# Patient Record
Sex: Female | Born: 1965 | Race: White | Hispanic: No | Marital: Married | Smoking: Current every day smoker
Health system: Southern US, Community
[De-identification: ages and names within clinical notes are randomized; demographics above are authoritative.]

## PROBLEM LIST (undated history)

## (undated) DIAGNOSIS — I639 Cerebral infarction, unspecified: Secondary | ICD-10-CM

## (undated) DIAGNOSIS — G709 Myoneural disorder, unspecified: Secondary | ICD-10-CM

## (undated) DIAGNOSIS — E785 Hyperlipidemia, unspecified: Secondary | ICD-10-CM

## (undated) DIAGNOSIS — I10 Essential (primary) hypertension: Secondary | ICD-10-CM

## (undated) DIAGNOSIS — E119 Type 2 diabetes mellitus without complications: Secondary | ICD-10-CM

## (undated) DIAGNOSIS — N189 Chronic kidney disease, unspecified: Secondary | ICD-10-CM

## (undated) DIAGNOSIS — N951 Menopausal and female climacteric states: Secondary | ICD-10-CM

## (undated) DIAGNOSIS — Z9289 Personal history of other medical treatment: Secondary | ICD-10-CM

## (undated) HISTORY — DX: Cerebral infarction, unspecified: I63.9

## (undated) HISTORY — DX: Chronic kidney disease, unspecified: N18.9

## (undated) HISTORY — DX: Type 2 diabetes mellitus without complications: E11.9

## (undated) HISTORY — DX: Menopausal and female climacteric states: N95.1

## (undated) HISTORY — DX: Hyperlipidemia, unspecified: E78.5

## (undated) HISTORY — PX: COLONOSCOPY: SHX174

## (undated) HISTORY — PX: KIDNEY TRANSPLANT: SHX239

---

## 1997-12-16 ENCOUNTER — Other Ambulatory Visit: Admission: RE | Admit: 1997-12-16 | Discharge: 1997-12-16 | Payer: Self-pay | Admitting: Gynecology

## 1998-12-15 ENCOUNTER — Other Ambulatory Visit: Admission: RE | Admit: 1998-12-15 | Discharge: 1998-12-15 | Payer: Self-pay | Admitting: *Deleted

## 1999-10-08 ENCOUNTER — Other Ambulatory Visit: Admission: RE | Admit: 1999-10-08 | Discharge: 1999-10-08 | Payer: Self-pay | Admitting: *Deleted

## 1999-12-21 ENCOUNTER — Ambulatory Visit (HOSPITAL_COMMUNITY): Admission: RE | Admit: 1999-12-21 | Discharge: 1999-12-21 | Payer: Self-pay | Admitting: *Deleted

## 1999-12-21 ENCOUNTER — Encounter: Payer: Self-pay | Admitting: *Deleted

## 2000-07-29 ENCOUNTER — Other Ambulatory Visit: Admission: RE | Admit: 2000-07-29 | Discharge: 2000-07-29 | Payer: Self-pay | Admitting: Gynecology

## 2001-01-09 ENCOUNTER — Other Ambulatory Visit: Admission: RE | Admit: 2001-01-09 | Discharge: 2001-01-09 | Payer: Self-pay | Admitting: Gynecology

## 2002-10-15 ENCOUNTER — Encounter: Admission: RE | Admit: 2002-10-15 | Discharge: 2003-01-13 | Payer: Self-pay | Admitting: Nephrology

## 2003-07-08 ENCOUNTER — Other Ambulatory Visit: Admission: RE | Admit: 2003-07-08 | Discharge: 2003-07-08 | Payer: Self-pay | Admitting: Family Medicine

## 2003-07-18 ENCOUNTER — Encounter: Admission: RE | Admit: 2003-07-18 | Discharge: 2003-07-18 | Payer: Self-pay | Admitting: Nephrology

## 2003-11-18 ENCOUNTER — Encounter (HOSPITAL_COMMUNITY): Admission: RE | Admit: 2003-11-18 | Discharge: 2003-11-29 | Payer: Self-pay | Admitting: Nephrology

## 2003-12-24 ENCOUNTER — Encounter: Admission: RE | Admit: 2003-12-24 | Discharge: 2004-03-23 | Payer: Self-pay | Admitting: Nephrology

## 2005-02-16 ENCOUNTER — Other Ambulatory Visit: Admission: RE | Admit: 2005-02-16 | Discharge: 2005-02-16 | Payer: Self-pay | Admitting: Family Medicine

## 2007-08-23 ENCOUNTER — Encounter: Admission: RE | Admit: 2007-08-23 | Discharge: 2007-08-23 | Payer: Self-pay | Admitting: Internal Medicine

## 2008-10-16 ENCOUNTER — Encounter: Admission: RE | Admit: 2008-10-16 | Discharge: 2008-10-16 | Payer: Self-pay | Admitting: Internal Medicine

## 2009-10-23 ENCOUNTER — Encounter: Admission: RE | Admit: 2009-10-23 | Discharge: 2009-10-23 | Payer: Self-pay | Admitting: Internal Medicine

## 2010-06-14 ENCOUNTER — Encounter: Payer: Self-pay | Admitting: Internal Medicine

## 2010-06-15 ENCOUNTER — Encounter: Payer: Self-pay | Admitting: Internal Medicine

## 2010-12-16 ENCOUNTER — Other Ambulatory Visit: Payer: Self-pay | Admitting: *Deleted

## 2010-12-16 DIAGNOSIS — Z1231 Encounter for screening mammogram for malignant neoplasm of breast: Secondary | ICD-10-CM

## 2010-12-28 ENCOUNTER — Ambulatory Visit
Admission: RE | Admit: 2010-12-28 | Discharge: 2010-12-28 | Disposition: A | Discharge status: Home / Self Care | Payer: BC Managed Care – PPO | Source: Ambulatory Visit | Attending: *Deleted | Admitting: *Deleted

## 2010-12-28 DIAGNOSIS — Z1231 Encounter for screening mammogram for malignant neoplasm of breast: Secondary | ICD-10-CM

## 2011-04-19 DIAGNOSIS — Z940 Kidney transplant status: Secondary | ICD-10-CM | POA: Insufficient documentation

## 2011-04-19 DIAGNOSIS — Z79899 Other long term (current) drug therapy: Secondary | ICD-10-CM | POA: Insufficient documentation

## 2011-06-07 ENCOUNTER — Ambulatory Visit (INDEPENDENT_AMBULATORY_CARE_PROVIDER_SITE_OTHER): Payer: BC Managed Care – PPO | Admitting: Family Medicine

## 2011-06-07 DIAGNOSIS — E789 Disorder of lipoprotein metabolism, unspecified: Secondary | ICD-10-CM

## 2011-06-07 DIAGNOSIS — B351 Tinea unguium: Secondary | ICD-10-CM

## 2011-06-07 DIAGNOSIS — J45909 Unspecified asthma, uncomplicated: Secondary | ICD-10-CM

## 2011-06-10 ENCOUNTER — Encounter: Payer: Self-pay | Admitting: Family Medicine

## 2011-06-10 DIAGNOSIS — E785 Hyperlipidemia, unspecified: Secondary | ICD-10-CM

## 2011-06-10 DIAGNOSIS — E872 Acidosis: Secondary | ICD-10-CM

## 2011-06-10 DIAGNOSIS — I10 Essential (primary) hypertension: Secondary | ICD-10-CM

## 2011-06-10 DIAGNOSIS — I151 Hypertension secondary to other renal disorders: Secondary | ICD-10-CM | POA: Insufficient documentation

## 2011-06-10 DIAGNOSIS — N189 Chronic kidney disease, unspecified: Secondary | ICD-10-CM | POA: Insufficient documentation

## 2011-07-12 ENCOUNTER — Other Ambulatory Visit: Payer: Self-pay | Admitting: Family Medicine

## 2011-07-14 NOTE — Telephone Encounter (Signed)
Pt would like for Dr Hal Hope nurse to contact her she is wanting to come in on Friday and have lab work done and has some questions

## 2011-07-19 ENCOUNTER — Telehealth: Payer: Self-pay

## 2011-07-19 NOTE — Telephone Encounter (Signed)
LMOM to CB with details. 

## 2011-07-19 NOTE — Telephone Encounter (Signed)
LMOM on asst's VM to please let us know what labs Dr Eliott Nine wants [place] to run and how often, and requested fax of latest OV notes

## 2011-07-19 NOTE — Telephone Encounter (Signed)
Spoke with patient who states [facility] would like [place] to get more involved in patients care.  ??We are to draw transplant labs here.  Patient needs LFT's q 2 weeks drawn as she is on Lamisil.  Blood sugars have been elevated, many between 160-200.  Lets schedule patient appointment next Monday with me.  Lets contact [facility] for their last office note and any labs they want drawn.  Patient aware some specialty labs can only be drawn at specialty drawing sites.

## 2011-07-19 NOTE — Telephone Encounter (Signed)
Msg for Dr. Hal Hope, pt states she left a msg last week and we did not respond. She needs to have specific labs done ASAP and wants to be sure Dr. Hal Hope has ordered these so she can come in for the lab draw.

## 2011-07-21 ENCOUNTER — Telehealth: Payer: Self-pay | Admitting: *Deleted

## 2011-07-21 NOTE — Telephone Encounter (Signed)
PT WANTED TO KNOW WHAT YOU WERE CALLING ABOUT AND WHAT LABWORK YOU WERE GOING TO DRAW.  SHOULD WE JUST GET HER TO COME IN.

## 2011-07-22 NOTE — Telephone Encounter (Signed)
Spoke with patient to follow up with me in clinic 07/26/11

## 2011-07-26 ENCOUNTER — Encounter: Payer: Self-pay | Admitting: Family Medicine

## 2011-07-26 ENCOUNTER — Ambulatory Visit (INDEPENDENT_AMBULATORY_CARE_PROVIDER_SITE_OTHER): Payer: BC Managed Care – PPO | Admitting: Family Medicine

## 2011-07-26 ENCOUNTER — Telehealth: Payer: Self-pay

## 2011-07-26 DIAGNOSIS — N189 Chronic kidney disease, unspecified: Secondary | ICD-10-CM

## 2011-07-26 DIAGNOSIS — Z940 Kidney transplant status: Secondary | ICD-10-CM

## 2011-07-26 DIAGNOSIS — E119 Type 2 diabetes mellitus without complications: Secondary | ICD-10-CM

## 2011-07-26 DIAGNOSIS — T8610 Unspecified complication of kidney transplant: Secondary | ICD-10-CM

## 2011-07-26 DIAGNOSIS — B351 Tinea unguium: Secondary | ICD-10-CM

## 2011-07-26 DIAGNOSIS — E785 Hyperlipidemia, unspecified: Secondary | ICD-10-CM

## 2011-07-26 DIAGNOSIS — I10 Essential (primary) hypertension: Secondary | ICD-10-CM

## 2011-07-26 DIAGNOSIS — E872 Acidosis: Secondary | ICD-10-CM

## 2011-07-26 LAB — COMPREHENSIVE METABOLIC PANEL
BUN: 35 mg/dL — ABNORMAL HIGH (ref 6–23)
CO2: 21 mEq/L (ref 19–32)
Calcium: 9.9 mg/dL (ref 8.4–10.5)
Chloride: 105 mEq/L (ref 96–112)
Creat: 1.46 mg/dL — ABNORMAL HIGH (ref 0.50–1.10)
Glucose, Bld: 158 mg/dL — ABNORMAL HIGH (ref 70–99)

## 2011-07-26 LAB — LIPID PANEL
Cholesterol: 184 mg/dL (ref 0–200)
HDL: 43 mg/dL (ref >39)
Total CHOL/HDL Ratio: 4.3 Ratio
Triglycerides: 347 mg/dL — ABNORMAL HIGH (ref <150)

## 2011-07-26 MED ORDER — INSULIN PEN NEEDLE 29G X 12.7MM MISC: Status: DC

## 2011-07-26 MED ORDER — INSULIN GLARGINE 100 UNIT/ML ~~LOC~~ SOLN: SUBCUTANEOUS | Status: DC

## 2011-07-26 NOTE — Telephone Encounter (Signed)
[facility] needs clarification on instructions.   one-  What kind of needles   two - they no longer have reusable needles - please specify.  [phone]

## 2011-07-26 NOTE — Telephone Encounter (Signed)
Gavin Pound called from [facility] requesting info on labs ordered at todays OV.  She asks that when the lab results come in, we fax results to [phone].  Note to Dr. Hal Hope.

## 2011-07-26 NOTE — Progress Notes (Signed)
  Subjective:    Patient ID: Isabella Obrien, female    DOB: [birth year], 46 y.o.   MRN: [id number]  HPI  Patient presents in follow up of OV with Dr. Eliott Nine at [facility]. Office blood work noted elevated blood sugars and patient referred to for diabetic care.  Patient started on Lamisil 2 1/2 weeks ago onychomycosis. Per her nephrologist she is to take 1/2 tablet daily and repeat liver enzymes q 2 weeks while she is on this medication.  Requests prograf level to be drawn and that we draw her transplant labs here.  Patient does not bring with her an order from her nephrologist.  Typically Dr. Eliott Nine and her prior nephrologist at [facility] would have her have these drawn at [facility].  Recent opthalmology exam noted early cataracts in both eyes secondary to prolong prednisone use.  Recent dermatology visit with skin lesion removed from forehead ?cancer.  Review of Systems     Objective:   Physical Exam  Constitutional: She appears well-developed and well-nourished.  HENT:  Mouth/Throat: Oropharynx is clear and moist.  Neck: Neck supple.  Cardiovascular: Normal rate, regular rhythm and normal heart sounds.   Pulmonary/Chest: Effort normal and breath sounds normal.  Neurological: She is alert.   Patient successfully taught to self inject 10 units of Lantus insulin via the solo star pen.        Assessment & Plan:   1. IDDM  Comprehensive metabolic panel, Lipid panel, POCT glycosylated hemoglobin (Hb A1C), TSH, insulin glargine (LANTUS SOLOSTAR) 100 UNIT/ML injection, Insulin Pen Needle 29G X 12.7MM MISC  2. Renal transplant disorder  Comprehensive metabolic panel  3. 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 CRF (chronic renal failure)  Patient to continue having her transplant labs under the care of nephrology  5. HTN (hypertension)    6. Dyslipidemia    7. Immunosuppression    8. Metabolic acidosis    9. Onychomycosis  Check liver enzymes q  2 weeks per nephrology   Follow up in two weeks to check blood sugars and recheck liver enzymes.

## 2011-07-26 NOTE — Telephone Encounter (Signed)
Called [facility] and [facility] notifying them that needles are for Solostar pen, and to disregard note to reuse needles, that is note to patient.  Verified with Chelle.  Chart to MD for prior msg.

## 2011-08-09 ENCOUNTER — Ambulatory Visit: Payer: BC Managed Care – PPO | Admitting: Family Medicine

## 2011-08-23 ENCOUNTER — Encounter: Payer: Self-pay | Admitting: Family Medicine

## 2011-08-23 ENCOUNTER — Ambulatory Visit (INDEPENDENT_AMBULATORY_CARE_PROVIDER_SITE_OTHER): Payer: BC Managed Care – PPO | Admitting: Family Medicine

## 2011-08-23 VITALS — BP 145/100 | HR 77 | Temp 97.4°F | Resp 18 | Ht 63.0 in | Wt 164.8 lb

## 2011-08-23 DIAGNOSIS — I10 Essential (primary) hypertension: Secondary | ICD-10-CM

## 2011-08-23 DIAGNOSIS — G44209 Tension-type headache, unspecified, not intractable: Secondary | ICD-10-CM

## 2011-08-23 DIAGNOSIS — Z5181 Encounter for therapeutic drug level monitoring: Secondary | ICD-10-CM

## 2011-08-23 DIAGNOSIS — N912 Amenorrhea, unspecified: Secondary | ICD-10-CM

## 2011-08-23 DIAGNOSIS — E785 Hyperlipidemia, unspecified: Secondary | ICD-10-CM

## 2011-08-23 DIAGNOSIS — N189 Chronic kidney disease, unspecified: Secondary | ICD-10-CM

## 2011-08-23 DIAGNOSIS — E119 Type 2 diabetes mellitus without complications: Secondary | ICD-10-CM

## 2011-08-23 DIAGNOSIS — B351 Tinea unguium: Secondary | ICD-10-CM

## 2011-08-23 DIAGNOSIS — E872 Acidosis: Secondary | ICD-10-CM

## 2011-08-23 DIAGNOSIS — Z940 Kidney transplant status: Secondary | ICD-10-CM

## 2011-08-23 LAB — COMPREHENSIVE METABOLIC PANEL
ALT: 16 U/L (ref 0–35)
BUN: 26 mg/dL — ABNORMAL HIGH (ref 6–23)
CO2: 24 mEq/L (ref 19–32)
Calcium: 10.2 mg/dL (ref 8.4–10.5)
Chloride: 106 mEq/L (ref 96–112)
Creat: 1.4 mg/dL — ABNORMAL HIGH (ref 0.50–1.10)

## 2011-08-23 LAB — GLUCOSE, POCT (MANUAL RESULT ENTRY): POC Glucose: 123

## 2011-08-23 MED ORDER — CYCLOBENZAPRINE HCL 10 MG PO TABS: 10.0000 mg | ORAL_TABLET | Freq: Every evening | ORAL | Status: AC | PRN

## 2011-08-23 NOTE — Progress Notes (Signed)
  Subjective:    Patient ID: Isabella Obrien, female    DOB: [birth year], 46 y.o.   MRN: [id number]  HPI Patient presents for routine follow up of her multiple medical problems  1) Renal transplant associated with chronic immunosuppression; progression renal decline; chronic             metabolic acidosis.     Was being followed at [facility] recently transferred care to Dr Eliott Nine; OV q 2 months to follow         GFR.  2) IDDM- fasting BS's improving; did not titrate up on insulin; did not take insulin this morning  3) HTN- renovascular in nature; BP has been running high recently; not regularly checking BP  4) Dyslipidemia- no side effects on Tricor and Lipitor  5) Family stressors- brother has alienated himself from family; now with tension headache  6) Onychomycosis- here for recheck of liver enzymes  7) Amenorrhea- last normal menses in February; recently developed hot flashes; per patient it is     possible she may be pregnant   Review of Systems     Objective:   Physical Exam  Constitutional: She appears well-developed.  HENT:  Mouth/Throat: Oropharynx is clear and moist.  Eyes: EOM are normal.  Neck: Neck supple. No JVD present. No thyromegaly present.  Cardiovascular: Normal rate, regular rhythm and normal heart sounds.   Pulmonary/Chest: Effort normal and breath sounds normal.  Abdominal: Soft. There is no hepatosplenomegaly.  Neurological: She is alert.  Skin: Skin is warm.          Assessment & Plan:   1. Amenorrhea  Follicle stimulating hormone, POCT urine pregnancy  2. Metabolic acidosis    3. CRF (chronic renal failure)    4. Dyslipidemia    5. HTN (hypertension)    6. Medication monitoring encounter  Comprehensive metabolic panel  7. Immunosuppression    8. DM type 2 (diabetes mellitus, type 2)    9. Onychomycosis    10. History of renal transplant    11. Tension headache  cyclobenzaprine (FLEXERIL) 10 MG tablet   Discuss very careful titration of  insulin given renal impairment Supportive counseling Increase Lotensin 20 mg 1 1/2 tablets daily.  Call in 2-3 weeks with BP readings. Patient in agreement with plan.

## 2011-08-24 ENCOUNTER — Telehealth: Payer: Self-pay | Admitting: Family Medicine

## 2011-08-26 NOTE — Telephone Encounter (Signed)
See documentation of 4/2 call under contact information

## 2011-09-20 ENCOUNTER — Telehealth: Payer: Self-pay

## 2011-09-20 NOTE — Telephone Encounter (Signed)
LM on home VM asking for call back.

## 2011-09-20 NOTE — Telephone Encounter (Signed)
Dr. Hal Hope- patient would like to speak with you regarding having labs.

## 2011-09-22 NOTE — Telephone Encounter (Signed)
LM asking for call back.

## 2011-09-24 ENCOUNTER — Telehealth: Payer: Self-pay

## 2011-09-24 NOTE — Telephone Encounter (Signed)
.  [facility] The patient called to say that she had labs done at her Nephrologist's office and they will be sent to [facility] from that doctor's office. This Clinical research associate believes this was just an [place] call.  Patient did not request return call from [facility].

## 2011-09-24 NOTE — Telephone Encounter (Signed)
Pt reports that she just had some labs done including LFTs at her kidney doctor's yesterday and they are supposed to send Dr Hal Hope the results. Her BP was around 140/90 yesterday, but has not been taking them at home as often as she should. Advised pt to take them 1-2 x day for about a week at least and call us w/results for Dr Hal Hope. Pt agreed. Pt wasn't sure if Dr Hal Hope wanted any tests run for her BS or not, but thinks the MD might have checked her A1c yesterday. Pt states she had been up to 12 units of insulin QD, but she was getting some fasting readings in 80s and 90s, and she was afraid they were getting too low. She dropped back to 10 units last week and they are not quite as low fasting and are getting up to as high as 150s sometimes later in the day. Pt asked if she should skip her insulin if her BS is below a certain # fasting? What f/up do you need pt to do at this point, or do you want to wait and see what labs look like? (Pt would like to not come in more than necessary d/t financial issues)

## 2011-09-24 NOTE — Telephone Encounter (Signed)
I have been unsuccessful trying to reach patient. She is a renal transplant patient with complex medical issues. Let try to reach her and the specifics to what she is needing.

## 2011-09-27 NOTE — Telephone Encounter (Signed)
Left message on VM for patient not to take insulin if BS's are less than 150 given her renal failure status.  Asked patient to call back with BP and fasting BP readings.  Have not yet received notes and labs from [facility]

## 2012-05-24 HISTORY — PX: ORIF ANKLE FRACTURE: SUR919

## 2012-11-05 ENCOUNTER — Ambulatory Visit: Payer: BC Managed Care – PPO

## 2012-11-05 ENCOUNTER — Ambulatory Visit (INDEPENDENT_AMBULATORY_CARE_PROVIDER_SITE_OTHER): Payer: BC Managed Care – PPO | Admitting: Emergency Medicine

## 2012-11-05 VITALS — BP 171/109 | HR 78 | Temp 98.1°F | Resp 20 | Ht 63.0 in | Wt 163.0 lb

## 2012-11-05 DIAGNOSIS — S82409A Unspecified fracture of shaft of unspecified fibula, initial encounter for closed fracture: Secondary | ICD-10-CM

## 2012-11-05 DIAGNOSIS — M25572 Pain in left ankle and joints of left foot: Secondary | ICD-10-CM

## 2012-11-05 DIAGNOSIS — M79609 Pain in unspecified limb: Secondary | ICD-10-CM

## 2012-11-05 DIAGNOSIS — S82402A Unspecified fracture of shaft of left fibula, initial encounter for closed fracture: Secondary | ICD-10-CM

## 2012-11-05 MED ORDER — HYDROCODONE-ACETAMINOPHEN 5-325 MG PO TABS: 1.0000 | ORAL_TABLET | ORAL | Status: DC | PRN

## 2012-11-05 NOTE — Patient Instructions (Addendum)
Ankle Fracture  A fracture is a break in the bone. A cast or splint is used to protect and keep your injured bone from moving.   HOME CARE INSTRUCTIONS    Use your crutches as directed.   To lessen the swelling, keep the injured leg elevated while sitting or lying down.   Apply ice to the injury for 15-20 minutes, 3-4 times per day while awake for 2 days. Put the ice in a plastic bag and place a thin towel between the bag of ice and your cast.   If you have a plaster or fiberglass cast:   Do not try to scratch the skin under the cast using sharp or pointed objects.   Check the skin around the cast every day. You may put lotion on any red or sore areas.   Keep your cast dry and clean.   If you have a plaster splint:   Wear the splint as directed.   You may loosen the elastic around the splint if your toes become numb, tingle, or turn cold or blue.   Do not put pressure on any part of your cast or splint; it may break. Rest your cast only on a pillow the first 24 hours until it is fully hardened.   Your cast or splint can be protected during bathing with a plastic bag. Do not lower the cast or splint into water.   Take medications as directed by your caregiver. Only take over-the-counter or prescription medicines for pain, discomfort, or fever as directed by your caregiver.   Do not drive a vehicle until your caregiver specifically tells you it is safe to do so.   If your caregiver has given you a follow-up appointment, it is very important to keep that appointment. Not keeping the appointment could result in a chronic or permanent injury, pain, and disability. If there is any problem keeping the appointment, you must call back to this facility for assistance.  SEEK IMMEDIATE MEDICAL CARE IF:    Your cast gets damaged or breaks.   You have continued severe pain or more swelling than you did before the cast was put on.   Your skin or toenails below the injury turn blue or gray, or feel cold or  numb.   There is a bad smell or new stains and/or purulent (pus like) drainage coming from under the cast.  If you do not have a window in your cast for observing the wound, a discharge or minor bleeding may show up as a stain on the outside of your cast. Report these findings to your caregiver.  MAKE SURE YOU:    Understand these instructions.   Will watch your condition.   Will get help right away if you are not doing well or get worse.  Document Released: 05/07/2000 Document Revised: 08/02/2011 Document Reviewed: 12/12/2007  ExitCare Patient Information 2014 [employer].

## 2012-11-05 NOTE — Progress Notes (Signed)
[facility] [address], [place] [address] [phone]  Date:  11/05/2012   Name:  Isabella Obrien   DOB:  [birth year]   MRN:  [id number]  PCP:  No primary provider on file.    Chief Complaint: left ankle injury   History of Present Illness:  Isabella Obrien is a 47 y.o. very pleasant female patient who presents with the following:  In[place] at the park when she stood up and rolled her ankle.  Unable to bear weight and has marked pain and swelling.  No improvement with over the counter medications or other home remedies. Denies other complaint or health concern today.   Patient Active Problem List   Diagnosis Date Noted  . DM type 2 (diabetes mellitus, type 2) 07/26/2011  . History of renal transplant 07/26/2011  . CRF (chronic renal failure) 06/10/2011  . HTN (hypertension) 06/10/2011  . Dyslipidemia 06/10/2011  . Immunosuppression 06/10/2011  . Metabolic acidosis 06/10/2011    No past medical history on file.  No past surgical history on file.  History  Substance Use Topics  . Smoking status: Current Every Day Smoker  . Smokeless tobacco: Not on file  . Alcohol Use: Yes    No family history on file.  Allergies  Allergen Reactions  . Benadryl (Diphenhydramine Hcl)   . Ceftin     Medication list has been reviewed and updated.  Current Outpatient Prescriptions on File Prior to Visit  Medication Sig Dispense Refill  . aspirin 81 MG tablet Take 160 mg by mouth daily.      Marland Kitchen atenolol (TENORMIN) 50 MG tablet Take 50 mg by mouth daily.      Marland Kitchen atorvastatin (LIPITOR) 40 MG tablet Take 40 mg by mouth daily.      . benazepril (LOTENSIN) 20 MG tablet Take 20 mg by mouth daily.      . calcium & magnesium carbonates (MYLANTA) 311-232 MG per tablet Take 1 tablet by mouth daily.      . furosemide (LASIX) 20 MG tablet Take 20 mg by mouth daily.      . magnesium 30 MG tablet Take 30 mg by mouth 2 (two) times daily.      . mycophenolate  (CELLCEPT) 500 MG tablet Take by mouth 2 (two) times daily.      . predniSONE (STERAPRED UNI-PAK) 5 MG TABS Take by mouth.      . sodium bicarbonate 325 MG tablet Take 325 mg by mouth 4 (four) times daily.      . tacrolimus (PROGRAF) 0.5 MG capsule Take 0.5 mg by mouth 2 (two) times daily.      Marland Kitchen zinc gluconate 50 MG tablet Take 50 mg by mouth daily.      . fenofibrate (TRICOR) 145 MG tablet Take 145 mg by mouth daily.      . insulin glargine (LANTUS SOLOSTAR) 100 UNIT/ML injection Start with 8 units daily then every 5 days increase by 2 units until fasting blood sugars less than or equal to 150.  10 mL  12  . Insulin Pen Needle 29G X 12.7MM MISC May reuse needle x 2  1 each  3   No current facility-administered medications on file prior to visit.    Review of Systems:  As per HPI, otherwise negative.    Physical Examination: Filed Vitals:   11/05/12 1101  BP: 171/109  Pulse: 78  Temp: 98.1 F (36.7 C)  Resp: 20   Filed  Vitals:   11/05/12 1101  Height: 5\' 3"  (1.6 m)  Weight: 163 lb (73.936 kg)   Body mass index is 28.88 kg/(m^2). Ideal Body Weight: Weight in (lb) to have BMI = 25: 140.8   GEN: WDWN, NAD, Non-toxic, Alert & Oriented x 3 HEENT: Atraumatic, Normocephalic.  Ears and Nose: No external deformity. EXTR: No clubbing/cyanosis/edema NEURO: Normal gait.  PSYCH: Normally interactive. Conversant. Not depressed or anxious appearing.  Calm demeanor.  LEFT ankle and foot ecchymotic and swollen.  Marked tenderness.  Assessment and Plan: Fracture distal fibula  Signed,  Phillips Odor, MD   [facility] reading (PRIMARY) by  Dr. Dareen Piano.  Fracture ankle.

## 2012-12-14 ENCOUNTER — Ambulatory Visit: Payer: BC Managed Care – PPO | Admitting: Endocrinology

## 2013-07-18 DIAGNOSIS — R809 Proteinuria, unspecified: Secondary | ICD-10-CM | POA: Insufficient documentation

## 2013-07-18 DIAGNOSIS — N189 Chronic kidney disease, unspecified: Secondary | ICD-10-CM | POA: Insufficient documentation

## 2013-07-18 DIAGNOSIS — N184 Chronic kidney disease, stage 4 (severe): Secondary | ICD-10-CM | POA: Insufficient documentation

## 2013-07-18 DIAGNOSIS — N051 Unspecified nephritic syndrome with focal and segmental glomerular lesions: Secondary | ICD-10-CM | POA: Insufficient documentation

## 2013-07-18 DIAGNOSIS — E1169 Type 2 diabetes mellitus with other specified complication: Secondary | ICD-10-CM | POA: Insufficient documentation

## 2013-07-18 DIAGNOSIS — M5431 Sciatica, right side: Secondary | ICD-10-CM | POA: Insufficient documentation

## 2013-07-18 DIAGNOSIS — E782 Mixed hyperlipidemia: Secondary | ICD-10-CM

## 2013-07-30 DIAGNOSIS — E139 Other specified diabetes mellitus without complications: Secondary | ICD-10-CM | POA: Insufficient documentation

## 2013-09-21 HISTORY — PX: OTHER SURGICAL HISTORY: SHX169

## 2013-12-14 ENCOUNTER — Other Ambulatory Visit: Payer: Self-pay | Admitting: Orthopedic Surgery

## 2013-12-17 ENCOUNTER — Encounter (HOSPITAL_COMMUNITY): Payer: Self-pay | Admitting: Pharmacy Technician

## 2013-12-20 ENCOUNTER — Encounter (HOSPITAL_COMMUNITY)
Admission: RE | Admit: 2013-12-20 | Discharge: 2013-12-20 | Disposition: A | Discharge status: Home / Self Care | Payer: BC Managed Care – PPO | Source: Ambulatory Visit | Attending: Orthopedic Surgery | Admitting: Orthopedic Surgery

## 2013-12-20 ENCOUNTER — Encounter (HOSPITAL_COMMUNITY): Payer: Self-pay

## 2013-12-20 DIAGNOSIS — E119 Type 2 diabetes mellitus without complications: Secondary | ICD-10-CM | POA: Diagnosis not present

## 2013-12-20 DIAGNOSIS — Z01810 Encounter for preprocedural cardiovascular examination: Secondary | ICD-10-CM | POA: Diagnosis not present

## 2013-12-20 DIAGNOSIS — Z01812 Encounter for preprocedural laboratory examination: Secondary | ICD-10-CM | POA: Diagnosis not present

## 2013-12-20 DIAGNOSIS — I10 Essential (primary) hypertension: Secondary | ICD-10-CM | POA: Insufficient documentation

## 2013-12-20 DIAGNOSIS — Z01818 Encounter for other preprocedural examination: Secondary | ICD-10-CM | POA: Diagnosis present

## 2013-12-20 DIAGNOSIS — F17200 Nicotine dependence, unspecified, uncomplicated: Secondary | ICD-10-CM | POA: Diagnosis not present

## 2013-12-20 HISTORY — DX: Myoneural disorder, unspecified: G70.9

## 2013-12-20 HISTORY — DX: Essential (primary) hypertension: I10

## 2013-12-20 HISTORY — DX: Personal history of other medical treatment: Z92.89

## 2013-12-20 LAB — CBC WITH DIFFERENTIAL/PLATELET
Basophils Absolute: 0 10*3/uL (ref 0.0–0.1)
Basophils Relative: 0 % (ref 0–1)
Eosinophils Absolute: 0.1 10*3/uL (ref 0.0–0.7)
Eosinophils Relative: 1 % (ref 0–5)
HEMATOCRIT: 32.2 % — AB (ref 36.0–46.0)
HEMOGLOBIN: 10.3 g/dL — AB (ref 12.0–15.0)
LYMPHS ABS: 1 10*3/uL (ref 0.7–4.0)
LYMPHS PCT: 10 % — AB (ref 12–46)
MCH: 28.1 pg (ref 26.0–34.0)
MCHC: 32 g/dL (ref 30.0–36.0)
MCV: 88 fL (ref 78.0–100.0)
MONO ABS: 0.5 10*3/uL (ref 0.1–1.0)
MONOS PCT: 5 % (ref 3–12)
NEUTROS ABS: 8.7 10*3/uL — AB (ref 1.7–7.7)
Neutrophils Relative %: 84 % — ABNORMAL HIGH (ref 43–77)
Platelets: 284 10*3/uL (ref 150–400)
RBC: 3.66 MIL/uL — AB (ref 3.87–5.11)
RDW: 13.9 % (ref 11.5–15.5)
WBC: 10.4 10*3/uL (ref 4.0–10.5)

## 2013-12-20 LAB — COMPREHENSIVE METABOLIC PANEL
ALT: 12 U/L (ref 0–35)
AST: 12 U/L (ref 0–37)
Albumin: 4.7 g/dL (ref 3.5–5.2)
Alkaline Phosphatase: 64 U/L (ref 39–117)
Anion gap: 16 — ABNORMAL HIGH (ref 5–15)
BILIRUBIN TOTAL: 0.2 mg/dL — AB (ref 0.3–1.2)
BUN: 25 mg/dL — ABNORMAL HIGH (ref 6–23)
CHLORIDE: 103 meq/L (ref 96–112)
CO2: 22 meq/L (ref 19–32)
CREATININE: 1.22 mg/dL — AB (ref 0.50–1.10)
Calcium: 9.7 mg/dL (ref 8.4–10.5)
GFR calc Af Amer: 61 mL/min — ABNORMAL LOW (ref >90)
GFR, EST NON AFRICAN AMERICAN: 52 mL/min — AB (ref >90)
Glucose, Bld: 142 mg/dL — ABNORMAL HIGH (ref 70–99)
Potassium: 4.7 mEq/L (ref 3.7–5.3)
Sodium: 141 mEq/L (ref 137–147)
Total Protein: 6.8 g/dL (ref 6.0–8.3)

## 2013-12-20 LAB — URINALYSIS, ROUTINE W REFLEX MICROSCOPIC
Bilirubin Urine: NEGATIVE
GLUCOSE, UA: NEGATIVE mg/dL
HGB URINE DIPSTICK: NEGATIVE
KETONES UR: NEGATIVE mg/dL
Leukocytes, UA: NEGATIVE
Nitrite: NEGATIVE
PROTEIN: 100 mg/dL — AB
Specific Gravity, Urine: 1.017 (ref 1.005–1.030)
UROBILINOGEN UA: 0.2 mg/dL (ref 0.0–1.0)
pH: 6.5 (ref 5.0–8.0)

## 2013-12-20 LAB — SURGICAL PCR SCREEN
MRSA, PCR: NEGATIVE
Staphylococcus aureus: NEGATIVE

## 2013-12-20 LAB — PROTIME-INR
INR: 0.9 (ref 0.00–1.49)
Prothrombin Time: 12.2 seconds (ref 11.6–15.2)

## 2013-12-20 LAB — APTT: APTT: 23 s — AB (ref 24–37)

## 2013-12-20 LAB — TYPE AND SCREEN
ABO/RH(D): A POS
Antibody Screen: NEGATIVE

## 2013-12-20 LAB — URINE MICROSCOPIC-ADD ON

## 2013-12-20 LAB — ABO/RH: ABO/RH(D): A POS

## 2013-12-20 NOTE — Progress Notes (Signed)
Primary - dr. Mardelle MatteAndy No cardiologist Dr. Eliott Nineunham Robbie Lis- [facility] No recent cardiac testing

## 2013-12-20 NOTE — Pre-Procedure Instructions (Signed)
Orvis BrillLorraine H Agent  12/20/2013   Your procedure is scheduled on:  Wednesday, August 5th  [facility] Admitting at 1100 AM.  Call this number if you have problems the morning of surgery: [phone]   Remember:   Do not eat food or drink liquids after midnight.   Take these medicines the morning of surgery with A SIP OF WATER: norvasc, atenolol, hydrocodone if needed, prednisone, cellcept, prograf, inhaler if needed  No diabetes medication or insulin on morning of surgery.  Stop taking aspirin, OTC vitamins/herbal medications, NSAIDS (ibuprofen, advil, motrin) 7 days prior to surgery.   Do not wear jewelry, make-up or nail polish.  Do not wear lotions, powders, or perfumes. You may wear deodorant.  Do not shave 48 hours prior to surgery. Men may shave face and neck.  Do not bring valuables to the hospital.  [facility] is not responsible for any belongings or valuables.               Contacts, dentures or bridgework may not be worn into surgery.  Leave suitcase in the car. After surgery it may be brought to your room.  For patients admitted to the hospital, discharge time is determined by your treatment team.               Patients discharged the day of surgery will not be allowed to drive home.  Please read over the following fact sheets that you were given: Pain Booklet, Coughing and Deep Breathing, Blood Transfusion Information, MRSA Information and Surgical Site Infection Prevention [place] - Preparing for Surgery  Before surgery, you can play an important role.  Because skin is not sterile, your skin needs to be as free of germs as possible.  You can reduce the number of germs on you skin by washing with CHG (chlorahexidine gluconate) soap before surgery.  CHG is an antiseptic cleaner which kills germs and bonds with the skin to continue killing germs even after washing.  Please DO NOT use if you have an allergy to CHG or antibacterial soaps.  If your skin  becomes reddened/irritated stop using the CHG and inform your nurse when you arrive at Short Stay.  Do not shave (including legs and underarms) for at least 48 hours prior to the first CHG shower.  You may shave your face.  Please follow these instructions carefully:   1.  Shower with CHG Soap the night before surgery and the morning of Surgery.  2.  If you choose to wash your hair, wash your hair first as usual with your normal shampoo.  3.  After you shampoo, rinse your hair and body thoroughly to remove the shampoo.  4.  Use CHG as you would any other liquid soap.  You can apply CHG directly to the skin and wash gently with scrungie or a clean washcloth.  5.  Apply the CHG Soap to your body ONLY FROM THE NECK DOWN.  Do not use on open wounds or open sores.  Avoid contact with your eyes, ears, mouth and genitals (private parts).  Wash genitals (private parts) with your normal soap.  6.  Wash thoroughly, paying special attention to the area where your surgery will be performed.  7.  Thoroughly rinse your body with warm water from the neck down.  8.  DO NOT shower/wash with your normal soap after using and rinsing off the CHG Soap.  9.  Pat yourself dry with a clean towel.  10.  Wear clean pajamas.            11.  Place clean sheets on your bed the night of your first shower and do not sleep with pets.  Day of Surgery  Do not apply any lotions/deoderants the morning of surgery.  Please wear clean clothes to the hospital/surgery center.

## 2013-12-21 ENCOUNTER — Encounter (HOSPITAL_COMMUNITY): Payer: Self-pay | Admitting: Vascular Surgery

## 2013-12-21 NOTE — Progress Notes (Addendum)
Anesthesia Chart Review:  Patient is a 48 year old female scheduled for L5-S1 decompression on 12/26/13 by Dr. Yevette Edwardsumonski.  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 PCP is Dr. Asencion Partridgeamille Andy.  Nephrologist is Dr. Camille Balynthia Dunham.  EKG on 12/20/13 showed NSR, T wave abnormality, consider anterolateral ischemia. Currently, there are no comparison EKGs available.  CXR on 12/20/13 showed: No acute cardiopulmonary disease.  Preoperative labs noted. BUN/Cr 25/1.22, glucose 142.  H/H 10.3/32.2.  Records requested from [facility] and [facility]--including any prior cardiac records.  I'll review once available.  Velna Ochsllison Angelis Gates, PA-C [facility] Short Stay Center/Anesthesiology Phone [phone] 12/21/2013 11:14 AM  Addendum: 12/25/2013 12:54 PM Records requested yesterday. I called again today records were just sent.  [facility] did not sent the EKG tracing as requested, but sent the EKG interpretation reports from 09/28/04 showing SR with non-specific T wave abnormality.  Stress echo on 08/28/03 showed normal LV chamber size, wall thickness and contractility with no segmental wall motion abnormalities, no evidence of inducible ischemia.    Her last transplant clinic note was from 12/13/13 with Dr. Knox SalivaAmber Reeves-Daniel.  Notes indicated she received a preemptive living related renal transplant to the right pelvis from her brother on 09/29/04. Post transplantation course was complicated by early FSGS recurrent require plasmapheresis. Nadircreatinine 1.5 with previous baseline creatinine 1.6-2.0. In March of 2015 she had increase in proteinuria (3.1 g), and it was recommended that she undergo a transplant biopsy which was performed 08/23/13 and showed FSGS, collapsing variant and nodular glomerulosclerosis, consistent with DM, as well as vascular changes. She started pheresis 09/24/2013 with improvement in UPC to around 1 g. She was receiving weekly  pheresis, now every two weeks until the end of August with hopes to do one session in the end of September and then discontinue.    Based on available information, EKG changes appear new--current changes do not appear non-specific as mentioned in her comparison EKG report.  Last cardiology testing received was 10 years ago. (She denied any recent cardiac testing at her PAT visit.) She has multiple CAD risk factors.  I reviewed above with anesthesiologist Dr. Noreene LarssonJoslin who agreed with need for preoperative cardiology evaluation.  Message left with Albin Fellingarla at Dr. Marshell Levanumonski's office.

## 2013-12-25 MED ORDER — VANCOMYCIN HCL IN DEXTROSE 1-5 GM/200ML-% IV SOLN: 1000.0000 mg | INTRAVENOUS | Status: DC

## 2013-12-25 MED ORDER — POVIDONE-IODINE 7.5 % EX SOLN
Freq: Once | CUTANEOUS | Status: DC
  Filled 2013-12-25: qty 118

## 2013-12-26 ENCOUNTER — Ambulatory Visit (HOSPITAL_COMMUNITY)
Admission: RE | Admit: 2013-12-26 | Payer: BC Managed Care – PPO | Source: Ambulatory Visit | Admitting: Orthopedic Surgery

## 2013-12-26 ENCOUNTER — Encounter (HOSPITAL_COMMUNITY): Admission: RE | Payer: Self-pay | Source: Ambulatory Visit

## 2013-12-26 SURGERY — LUMBAR LAMINECTOMY/DECOMPRESSION MICRODISCECTOMY
Anesthesia: General

## 2013-12-31 ENCOUNTER — Telehealth: Payer: Self-pay | Admitting: Internal Medicine

## 2013-12-31 ENCOUNTER — Ambulatory Visit (INDEPENDENT_AMBULATORY_CARE_PROVIDER_SITE_OTHER): Payer: BC Managed Care – PPO | Admitting: Internal Medicine

## 2013-12-31 VITALS — BP 132/78 | HR 71 | Ht 63.0 in | Wt 156.0 lb

## 2013-12-31 DIAGNOSIS — Z01810 Encounter for preprocedural cardiovascular examination: Secondary | ICD-10-CM

## 2013-12-31 NOTE — Progress Notes (Signed)
HPI Patient is a 48 yo who is referred for cardiac risk stratification  Being evaluated for back surgery Patient has no known CAD Hix of FSGS (s/p renal tx), DM2, HTN and tob use   EKG in July showed SR Twave inversion V1-V6   Patient had a str[facility] prior to transplant  May have had an EKG since at [facility]. She denies CP  She is very active  Does housework  Can walk flights of stairs without problem  Walks outside.  She is fatigued but attib to stress    Patient has been on lipitor for years  Metformin for a couple years   Allergies  Allergen Reactions  . Benadryl [Diphenhydramine Hcl] Other (See Comments)    REACTION: anxiety, hyperactivity  . Ceftin Swelling  . Tape Rash    Paper tape ok.    Current Outpatient Prescriptions  Medication Sig Dispense Refill  . albuterol (PROVENTIL HFA;VENTOLIN HFA) 108 (90 BASE) MCG/ACT inhaler Inhale into the lungs every 6 (six) hours as needed for wheezing or shortness of breath.      Marland Kitchen. amLODipine (NORVASC) 5 MG tablet Take 5 mg by mouth daily.      Marland Kitchen. aspirin EC 81 MG tablet Take 81 mg by mouth daily.      Marland Kitchen. atenolol (TENORMIN) 100 MG tablet Take 100 mg by mouth daily.      Marland Kitchen. atorvastatin (LIPITOR) 40 MG tablet Take 40 mg by mouth daily.      . benazepril (LOTENSIN) 40 MG tablet Take 40 mg by mouth 2 (two) times daily.      . calcium carbonate (OS-CAL) 600 MG TABS tablet Take 1,200 mg by mouth daily.      . cholecalciferol (VITAMIN D) 1000 UNITS tablet Take 1,000 Units by mouth daily.      . furosemide (LASIX) 20 MG tablet Take 20 mg by mouth every Monday, Wednesday, and Friday.      Marland Kitchen. HYDROcodone-acetaminophen (NORCO) 10-325 MG per tablet Take 1-2 tablets by mouth every 4 (four) hours as needed for moderate pain.      . Magnesium 250 MG TABS Take 250 mg by mouth 2 (two) times daily.      . metFORMIN (GLUCOPHAGE) 1000 MG tablet Take 1,000 mg by mouth 2 (two) times daily with a meal.      . mycophenolate (CELLCEPT) 500 MG  tablet Take by mouth 2 (two) times daily.      . predniSONE (DELTASONE) 5 MG tablet Take 5 mg by mouth daily with breakfast.      . sodium bicarbonate 325 MG tablet Take 650 mg by mouth 3 (three) times daily.       . tacrolimus (PROGRAF) 1 MG capsule Take 1 mg by mouth 2 (two) times daily.      Marland Kitchen. zinc gluconate 50 MG tablet Take 50 mg by mouth daily.       No current facility-administered medications for this visit.    Past Medical History  Diagnosis Date  . Hypertension   . Bronchitis, allergic   . Pneumonia     hx of  . Diabetes mellitus without complication     dm type 2 - fasting 150  . Neuromuscular disorder   . History of blood product transfusion   . Chronic kidney disease     has kidney transplant - having aphoresis now every other week    Past Surgical History  Procedure Laterality Date  . Kidney transplant    .  Colonoscopy    . Diateck catheter placement  09/2013  . Ankle fracture Left     pins and rod in place    No family history on file.  History   Social History  . Marital Status: Married    Spouse Name: N/A    Number of Children: N/A  . Years of Education: N/A   Occupational History  . Not on file.   Social History Main Topics  . Smoking status: Current Every Day Smoker -- 0.50 packs/day for 30 years    Types: Cigarettes  . Smokeless tobacco: Not on file  . Alcohol Use: Yes     Comment: weekends  . Drug Use: No  . Sexual Activity: Not on file   Other Topics Concern  . Not on file   Social History Narrative  . No narrative on file    Review of Systems:  All systems reviewed.  They are negative to the above problem except as previously stated.  Vital Signs: BP 132/78  Pulse 71  Ht 5\' 3"  (1.6 m)  Wt 156 lb (70.761 kg)  BMI 27.64 kg/m2  LMP 10/24/2012  Physical Exam [place] is in NAD HEENT:  Normocephalic, atraumatic. EOMI, PERRLA.  Neck: JVP is normal.  No bruits.  Lungs: clear to auscultation. No rales no wheezes.  Heart: Regular  rate and rhythm. Normal S1, S2. No S3.   No significant murmurs. PMI not displaced.  Abdomen:  Supple, nontender. Normal bowel sounds. No masses. No hepatomegaly.  Extremities:   Good distal pulses throughout. No lower extremity edema.  Musculoskeletal :moving all extremities.  Neuro:   alert and oriented x3.  CN II-XII grossly intact.  EKG  SR 79  T wave inversion V1-V6  Biphasic T waves in I, AVL Assessment and Plan:  Patient is a 48 yo who is being evaluated for back surgery  Active  Asymptomatic   EKG shows SR with T wave inversion.  I will try to get old EKGs as well as studies done at [facility]. She is very active without symptoms  Exam unremarkable   EKG alone does not prohibit surgery  Ovreall I think she is at low risk for cardiac event and is  OK to proceed without further testing  Prior.   Encouraged her to stay active.

## 2013-12-31 NOTE — Telephone Encounter (Signed)
ROI faxed to [facility] @ [phone]   8.10.15/km

## 2014-01-24 ENCOUNTER — Other Ambulatory Visit: Payer: Self-pay | Admitting: Orthopedic Surgery

## 2014-02-21 ENCOUNTER — Inpatient Hospital Stay (HOSPITAL_COMMUNITY)
Admission: RE | Admit: 2014-02-21 | Discharge: 2014-02-21 | Disposition: A | Discharge status: Home / Self Care | Payer: BC Managed Care – PPO | Source: Ambulatory Visit

## 2014-02-21 NOTE — Progress Notes (Signed)
Nurse called and left a voicemail inquring about 0900 scheduled PAT appointment. Direct call back number left.

## 2014-02-27 ENCOUNTER — Encounter (HOSPITAL_COMMUNITY): Admission: RE | Payer: Self-pay | Source: Ambulatory Visit

## 2014-02-27 ENCOUNTER — Ambulatory Visit (HOSPITAL_COMMUNITY)
Admission: RE | Admit: 2014-02-27 | Payer: BC Managed Care – PPO | Source: Ambulatory Visit | Admitting: Orthopedic Surgery

## 2014-02-27 SURGERY — LUMBAR LAMINECTOMY/DECOMPRESSION MICRODISCECTOMY
Anesthesia: General

## 2014-11-18 ENCOUNTER — Other Ambulatory Visit: Payer: Self-pay

## 2017-04-11 ENCOUNTER — Telehealth: Payer: Self-pay | Admitting: Physician Assistant

## 2017-04-11 ENCOUNTER — Ambulatory Visit: Payer: BC Managed Care – PPO | Admitting: Physician Assistant

## 2017-04-11 ENCOUNTER — Other Ambulatory Visit: Payer: Self-pay

## 2017-04-11 VITALS — BP 108/66 | HR 71 | Temp 98.1°F | Resp 16 | Ht 63.0 in | Wt 162.0 lb

## 2017-04-11 DIAGNOSIS — S76011A Strain of muscle, fascia and tendon of right hip, initial encounter: Secondary | ICD-10-CM | POA: Diagnosis not present

## 2017-04-11 DIAGNOSIS — M62838 Other muscle spasm: Secondary | ICD-10-CM

## 2017-04-11 DIAGNOSIS — S76311A Strain of muscle, fascia and tendon of the posterior muscle group at thigh level, right thigh, initial encounter: Secondary | ICD-10-CM

## 2017-04-11 MED ORDER — HYDROCODONE-ACETAMINOPHEN 5-325 MG PO TABS: 1.0000 | ORAL_TABLET | Freq: Four times a day (QID) | ORAL | 0 refills | Status: DC | PRN

## 2017-04-11 NOTE — Telephone Encounter (Signed)
Pt requesting Flexeril

## 2017-04-11 NOTE — Patient Instructions (Addendum)
Do not operate heavy machinery with taking the flexeril.  I would like you to ice this area three times per day for 15 minutes.   After 48 hours, I would like you to perform small easy stretches below.  Pick three pictures.   You can also just take tylenol for pain, if this hamstring pain is not too severe.    Hamstring Strain A hamstring strain is an injury that occurs when the hamstring muscles are overstretched or overloaded. The hamstring muscles are a group of muscles at the back of the thighs. These muscles are used in straightening the hips, bending the knees, and pulling back the legs. This type of injury is often called a pulled hamstring muscle. The severity of a muscle strain is rated in degrees. First-degree strains have the least amount of muscle fiber tearing and pain. Second-degree and third-degree strains have increasingly more tearing and pain. What are the causes? Hamstring strains occur when a sudden, violent force is placed on these muscles and stretches them too far. This often occurs during activities that involve running, jumping, kicking, or weight lifting. What increases the risk? Hamstring strains are especially common in athletes. Other things that can increase your risk for this injury include:  Having low strength, endurance, or flexibility of the hamstring muscles.  Performing high-impact physical activity.  Having poor physical fitness.  Having a previous leg injury.  Having fatigued muscles.  Older age.  What are the signs or symptoms?  Pain in the back of the thigh.  Bruising.  Swelling.  Muscle spasm.  Difficulty using the muscle because of pain or lack of normal function. For severe strains, you may have a popping or snapping feeling when the injury occurs. How is this diagnosed? Your health care provider will perform a physical exam and ask about your medical history. How is this treated? Often, the best treatment for a hamstring strain is  protecting, resting, icing, applying compression, and elevating the injured area. This is referred to as the PRICE method of treatment. Your health care provider may also recommend medicines to help reduce pain or inflammation. Follow these instructions at home:  Use the PRICE method of treatment to promote muscle healing during the first 2-3 days after your injury. The PRICE method involves: ? P-Protecting the muscle from being injured again. ? R-Restricting your activity and resting the injured body part. ? I-Icing your injury. To do this, put ice in a plastic bag. Place a towel between your skin and the bag. Then, apply the ice and leave it on for 20 minutes, 2-3 times per day. After the third day, switch to moist heat packs. ? C-Applying compression to the injured area with an elastic bandage. Be careful not to wrap it too tightly. That may interfere with blood circulation or may increase swelling. ? E-Elevating the injured body part above the level of your heart as often as you can. You can do this by putting a pillow under your thigh when you sit or lie down.  Take medicines only as directed by your health care provider.  Begin exercising or stretching as directed by your health care provider.  Do not return to full activity level until your health care provider approves.  Keep all follow-up visits as directed by your health care provider. This is important. Contact a health care provider if:  You have increasing pain or swelling in the injured area.  You have numbness, tingling, or a significant loss of strength in  the injured area.  Your foot or your toes become cold or turn blue. This information is not intended to replace advice given to you by your health care provider. Make sure you discuss any questions you have with your health care provider. Document Released: 02/02/2001 Document Revised: 10/16/2015 Document Reviewed: 12/24/2013 Elsevier Interactive Patient Education  2018  [employer].  Hamstring Strain Rehab Ask your health care provider which exercises are safe for you. Do exercises exactly as told by your health care provider and adjust them as directed. It is normal to feel mild stretching, pulling, tightness, or discomfort as you do these exercises, but you should stop right away if you feel sudden pain or your pain gets worse.Do not begin these exercises until told by your health care provider. Strengthening exercises These exercises build strength and endurance in your thighs. Endurance is the ability to use your muscles for a long time, even after your muscles get tired. Exercise A: Straight leg raises ( hip extensors) 1. Lie on your belly on a firm surface. 2. Tense the muscles in your buttocks to lift your left / right leg about 4 inches (10 cm). Keep your knee straight. 3. If you cannot lift your leg this high without arching your back, place a pillow under your hips. 4. Hold the position for __________ seconds. 5. Slowly lower your leg to the starting position and allow it to relax completely before you start the next repetition. Repeat __________ times. Complete this exercise __________ times a day. Exercise B: Bridge ( hip extensors) 1. Lie on your back on a firm surface with your knees bent and your feet flat on the floor. 2. Tighten your buttocks muscles and lift your bottom off the floor until your trunk is level with your thighs. ? You should feel the muscles working in your buttocks and the back of your thighs. ? Do not arch your back. 3. Hold this position for __________ seconds. 4. Slowly lower your hips to the starting position. 5. Let your buttocks muscles relax completely between repetitions. Repeat __________ times. Complete this exercise __________ times a day. If told by your health care provider, keep your bottom lifted off the floor while you slowly walk your feet away from you as far as you can control. Hold for __________  seconds, then slowly walk your feet back toward you. Exercise C: Lateral walking with band ( hip abductors) 1. Stand in a long hallway. 2. Wrap a loop of exercise band around your legs, just above your knees. 3. Bend your knees gently and drop your hips down and back so your weight is over your heels. 4. Step to the side to move down the length of the hallway, keeping your toes pointed ahead of you and keeping tension in the band. 5. Repeat, leading with your other leg. Repeat __________ times. Complete this exercise __________ times a day. Exercise D: Single leg stand with reaching ( eccentric hamstring) 1. Stand on your left / right foot. Keep your big toe down on the floor and try to keep your arch lifted. 2. Slowly reach down toward the floor as far as you can while keeping your balance. 3. Hold this position for __________ seconds. Repeat __________ times. Complete this exercise __________ times a day. Exercise E: Prone plank ( abdominals and core) 1. Lie on your belly on the floor and prop yourself up on your elbows. Your hands should be straight out in front of you, and your elbows should be  below your shoulders. Position your feet so the balls of your feet touch the ground. The ball of your foot is on the walking surface, right under your toes. 2. Tighten your abdominal muscles and lift your body off the floor. ? Do not arch your back. ? Do not hold your breath. 3. Hold this position for __________ seconds. Repeat __________ times. Complete this exercise __________ times a day. This information is not intended to replace advice given to you by your health care provider. Make sure you discuss any questions you have with your health care provider. Document Released: 05/10/2005 Document Revised: 01/15/2016 Document Reviewed: 02/06/2015 Elsevier Interactive Patient Education  2018 [employer]     IF you received an x-ray today, you will receive an invoice from [facility]. Please contact [facility] at [phone] with questions or concerns regarding your invoice.   IF you received labwork today, you will receive an invoice from [facility]. Please contact [facility] at [phone] with questions or concerns regarding your invoice.   Our billing staff will not be able to assist you with questions regarding bills from these companies.  You will be contacted with the lab results as soon as they are available. The fastest way to get your results is to activate your My Chart account. Instructions are located on the last page of this paperwork. If you have not heard from us regarding the results in 2 weeks, please contact this office.

## 2017-04-11 NOTE — Progress Notes (Signed)
[facility] [address], [place] [phone]  Date:  04/11/2017   Name:  Isabella Obrien   DOB:  [birth year]   MRN:  [id number]  PCP:  System, Pcp Not In    History of Present Illness:  Isabella Obrien is a 51 y.o. female patient who presents to PCP with  Chief Complaint  Patient presents with  . Fall    x yesterday  . Hip Pain    right side pain going down leg/ x 1 day     Left foot got caught on the hose [place] [employer] yesterday.  She stopped herself from following forward, but jerked her back.  She felt like she was sitting on a lump.  She has difficulty ambulating.  No numbness or tingling.  Sitting in the car is fine.  Laying down is very painful.   Patient Active Problem List   Diagnosis Date Noted  . DM type 2 (diabetes mellitus, type 2) (HCC) 07/26/2011  . History of renal transplant 07/26/2011  . CRF (chronic renal failure) 06/10/2011  . HTN (hypertension) 06/10/2011  . Dyslipidemia 06/10/2011  . Immunosuppression (HCC) 06/10/2011  . Metabolic acidosis 06/10/2011    Past Medical History:  Diagnosis Date  . Bronchitis, allergic   . Chronic kidney disease    has kidney transplant - having aphoresis now every other week  . Diabetes mellitus without complication (HCC)    dm type 2 - fasting 150  . History of blood product transfusion   . Hypertension   . Neuromuscular disorder (HCC)   . Pneumonia    hx of    Past Surgical History:  Procedure Laterality Date  . ankle fracture Left    pins and rod in place  . COLONOSCOPY    . diateck catheter placement  09/2013  . KIDNEY TRANSPLANT      Social History   Tobacco Use  . Smoking status: Current Every Day Smoker    Packs/day: 0.50    Years: 30.00    Pack years: 15.00    Types: Cigarettes  Substance Use Topics  . Alcohol use: Yes    Comment: weekends  . Drug use: No    No family history on file.  Allergies  Allergen Reactions  . Benadryl [Diphenhydramine Hcl] Other  (See Comments)    REACTION: anxiety, hyperactivity  . Ceftin Swelling  . Tape Rash    Paper tape ok.    Medication list has been reviewed and updated.  Current Outpatient Medications on File Prior to Visit  Medication Sig Dispense Refill  . albuterol (PROVENTIL HFA;VENTOLIN HFA) 108 (90 BASE) MCG/ACT inhaler Inhale into the lungs every 6 (six) hours as needed for wheezing or shortness of breath.    Marland Kitchen. amLODipine (NORVASC) 5 MG tablet Take 5 mg by mouth daily.    Marland Kitchen. aspirin EC 81 MG tablet Take 81 mg by mouth daily.    Marland Kitchen. atenolol (TENORMIN) 100 MG tablet Take 100 mg by mouth daily.    Marland Kitchen. atorvastatin (LIPITOR) 40 MG tablet Take 40 mg by mouth daily.    . benazepril (LOTENSIN) 40 MG tablet Take 40 mg by mouth 2 (two) times daily.    . furosemide (LASIX) 20 MG tablet Take 20 mg by mouth every Monday, Wednesday, and Friday.    . metFORMIN (GLUCOPHAGE) 1000 MG tablet Take 1,000 mg by mouth 2 (two) times daily with a meal.    . mycophenolate (CELLCEPT) 500 MG tablet Take  by mouth 2 (two) times daily.    . predniSONE (DELTASONE) 5 MG tablet Take 5 mg by mouth daily with breakfast.    . sodium bicarbonate 325 MG tablet Take 650 mg by mouth 3 (three) times daily.     . tacrolimus (PROGRAF) 1 MG capsule Take 1 mg by mouth 2 (two) times daily.     No current facility-administered medications on file prior to visit.     ROS ROS otherwise unremarkable unless listed above.  Physical Examination: BP 108/66   Pulse 71   Temp 98.1 F (36.7 C) (Oral)   Resp 16   Ht 5\' 3"  (1.6 m)   Wt 162 lb (73.5 kg)   LMP 10/24/2012   SpO2 96%   BMI 28.70 kg/m  Ideal Body Weight: Weight in (lb) to have BMI = 25: 140.8  Physical Exam  Constitutional: She is oriented to person, place, and time. She appears well-developed and well-nourished. No distress.  HENT:  Head: Normocephalic and atraumatic.  Right Ear: External ear normal.  Left Ear: External ear normal.  Eyes: Conjunctivae and EOM are normal.  Pupils are equal, round, and reactive to light.  Cardiovascular: Normal rate.  Pulmonary/Chest: Effort normal. No respiratory distress.  Musculoskeletal:       Lumbar back: She exhibits decreased range of motion.  Right gluteus with ecchymosis and tender . Difficulty with rom of extension.    Neurological: She is alert and oriented to person, place, and time.  Skin: She is not diaphoretic.  Psychiatric: She has a normal mood and affect. Her behavior is normal.     Assessment and Plan: Isabella Obrien is a 51 y.o. female who is here today for cc of  Chief Complaint  Patient presents with  . Fall    x yesterday  . Hip Pain    right side pain going down leg/ x 1 day   Possibility of gluteal rupture/strain.  Advised icing, and rom after 24-48 hours.  She will return in 5 days for recheck.  May have to consider orthopedist visit if no improvement.   Given norco for some added pain relief.  Muscle strain of gluteal region, right, initial encounter  Trena PlattStephanie English, PA-C [facility] 11/25/20183:55 PM

## 2017-04-11 NOTE — Telephone Encounter (Signed)
Copied from CRM [id number]. Topic: Quick Communication - See Telephone Encounter >> Apr 11, 2017  1:06 PM Isabella Obrien, Isabella Obrien, [place] wrote: CRM for notification. See Telephone encounter for: Patient called because her flexeril isn't at the pharmacy yet and she is there to pick it up  04/11/17.

## 2017-04-11 NOTE — Telephone Encounter (Signed)
Patient called for an update on flexril. I did let pt know of the 24-48 hour policy on refills

## 2017-04-12 ENCOUNTER — Ambulatory Visit: Payer: Self-pay | Admitting: Hematology

## 2017-04-12 MED ORDER — CYCLOBENZAPRINE HCL 10 MG PO TABS: 5.0000 mg | ORAL_TABLET | Freq: Three times a day (TID) | ORAL | 0 refills | Status: DC | PRN

## 2017-04-12 NOTE — Telephone Encounter (Signed)
Isabella Obrien,   No flexeril was called into pharmacy.  Please advise.

## 2017-04-12 NOTE — Telephone Encounter (Signed)
I sent this today.  I thought I did.  Thank you so much!

## 2017-04-12 NOTE — Telephone Encounter (Signed)
Patient states she fell/tripped on Sunday and her right foot got caught.  Patient states she was seen yesterday and was supposed to get flexeril.  I explained that bruising is normal.  I provided some home care advice to the patient.  I explained per Epic prescription sent today cyclobenzaprine (FLEXERIL) 10 MG tablet 30 tablet 0 04/12/2017    Sig - Route: Take 0.5-1 tablets (5-10 mg total) 3 (three) times daily as needed by mouth. - Oral   Sent to pharmacy as: cyclobenzaprine (FLEXERIL) 10 MG tablet   E-Prescribing Status: Receipt confirmed by pharmacy (04/12/2017 8:53 AM EST)    Patient also asking that hydrocodone be refilled.  I explained that Narcotics is not always the first choice.  I explained that since she just got the prescription yesterday to try to take it, use warm compresses, and positioning to help with pain.  If the pain is not improved over a few days she may need to be seen again.  Patient verbalizes understanding.  Reason for Disposition . Minor bruise  Protocols used: BRUISES-A-AH

## 2017-04-15 ENCOUNTER — Ambulatory Visit: Payer: Self-pay

## 2017-04-15 ENCOUNTER — Encounter (HOSPITAL_COMMUNITY): Payer: Self-pay | Admitting: Emergency Medicine

## 2017-04-15 ENCOUNTER — Ambulatory Visit (HOSPITAL_COMMUNITY)
Admission: EM | Admit: 2017-04-15 | Discharge: 2017-04-15 | Disposition: A | Discharge status: Home / Self Care | Payer: BC Managed Care – PPO | Attending: Emergency Medicine | Admitting: Emergency Medicine

## 2017-04-15 ENCOUNTER — Other Ambulatory Visit: Payer: Self-pay

## 2017-04-15 DIAGNOSIS — S76312D Strain of muscle, fascia and tendon of the posterior muscle group at thigh level, left thigh, subsequent encounter: Secondary | ICD-10-CM | POA: Diagnosis not present

## 2017-04-15 DIAGNOSIS — Z760 Encounter for issue of repeat prescription: Secondary | ICD-10-CM | POA: Diagnosis not present

## 2017-04-15 MED ORDER — HYDROCODONE-ACETAMINOPHEN 7.5-325 MG PO TABS: 1.0000 | ORAL_TABLET | ORAL | 0 refills | Status: DC | PRN

## 2017-04-15 NOTE — ED Triage Notes (Signed)
Pt states "Sunday I tripped and almost fell, when I was falling I jerked myself fall back and I felt something pop in my right hip and something happened behind my right knee. I went to [address] on Monday and they gave me hydrocodone, and they said if I wanted more to call them, I called them and they said they were busy and sent me here. I need a refill on my hydrocodone and I think I need a stronger dose too".

## 2017-04-15 NOTE — ED Provider Notes (Signed)
[facility]    CSN: [id number] Arrival date & time: 04/15/17  1053     History   Chief Complaint Chief Complaint  Patient presents with  . Medication Refill    HPI Isabella Obrien is a 51 y.o. female.   Nursing notes noted. Proximally 3-4 days ago this patient was at a gas station she leaned forward and almost fell. In the attempt to stop her fall she stretched her hip and hand strain muscle. [facility] was seen [facility]. She was treated with hydrocodone. She states she is out of medicine but continues to have pain. She is unable to take NSAIDs or other medications due to chronic renal insufficiency caused by diabetes and other chronic conditions. She is applying ice. Rest and taking the medication. She is requesting a refill.      Past Medical History:  Diagnosis Date  . Bronchitis, allergic   . Chronic kidney disease    has kidney transplant - having aphoresis now every other week  . Diabetes mellitus without complication (HCC)    dm type 2 - fasting 150  . History of blood product transfusion   . Hypertension   . Neuromuscular disorder (HCC)   . Pneumonia    hx of    Patient Active Problem List   Diagnosis Date Noted  . DM type 2 (diabetes mellitus, type 2) (HCC) 07/26/2011  . History of renal transplant 07/26/2011  . CRF (chronic renal failure) 06/10/2011  . HTN (hypertension) 06/10/2011  . Dyslipidemia 06/10/2011  . Immunosuppression (HCC) 06/10/2011  . Metabolic acidosis 06/10/2011    Past Surgical History:  Procedure Laterality Date  . ankle fracture Left    pins and rod in place  . COLONOSCOPY    . diateck catheter placement  09/2013  . KIDNEY TRANSPLANT      OB History    No data available       Home Medications    Prior to Admission medications   Medication Sig Start Date End Date Taking? Authorizing Provider  albuterol (PROVENTIL HFA;VENTOLIN HFA) 108 (90 BASE) MCG/ACT inhaler Inhale into the lungs every 6 (six) hours as needed for  wheezing or shortness of breath.    [provider]  amLODipine (NORVASC) 5 MG tablet Take 5 mg by mouth daily.    [provider]  aspirin EC 81 MG tablet Take 81 mg by mouth daily.    [provider]  atenolol (TENORMIN) 100 MG tablet Take 100 mg by mouth daily.    [provider]  atorvastatin (LIPITOR) 40 MG tablet Take 40 mg by mouth daily.    [provider]  benazepril (LOTENSIN) 40 MG tablet Take 40 mg by mouth 2 (two) times daily.    [provider]  cyclobenzaprine (FLEXERIL) 10 MG tablet Take 0.5-1 tablets (5-10 mg total) 3 (three) times daily as needed by mouth. 04/12/17   Trena PlattEnglish, Stephanie D, PA  furosemide (LASIX) 20 MG tablet Take 20 mg by mouth every Monday, Wednesday, and Friday.    [provider]  HYDROcodone-acetaminophen (NORCO) 7.5-325 MG tablet Take 1 tablet by mouth every 4 (four) hours as needed. 04/15/17   Hayden RasmussenMabe, Sherril Shipman, NP  metFORMIN (GLUCOPHAGE) 1000 MG tablet Take 1,000 mg by mouth 2 (two) times daily with a meal.    [provider]  mycophenolate (CELLCEPT) 500 MG tablet Take by mouth 2 (two) times daily.    [provider]  predniSONE (DELTASONE) 5 MG tablet Take 5 mg  by mouth daily with breakfast.    [provider]  sodium bicarbonate 325 MG tablet Take 650 mg by mouth 3 (three) times daily.     [provider]  tacrolimus (PROGRAF) 1 MG capsule Take 1 mg by mouth 2 (two) times daily.    [provider]    Family History No family history on file.  Social History Social History   Tobacco Use  . Smoking status: Current Every Day Smoker    Packs/day: 0.50    Years: 30.00    Pack years: 15.00    Types: Cigarettes  Substance Use Topics  . Alcohol use: Yes    Comment: weekends  . Drug use: No     Allergies   Benadryl [diphenhydramine hcl]; Ceftin; and Tape   Review of Systems Review of Systems  Constitutional: Negative.  Negative for activity  change, chills and fever.  HENT: Negative.   Respiratory: Negative.   Cardiovascular: Negative.   Musculoskeletal:       As per HPI  Skin: Negative for color change, pallor and rash.  Neurological: Negative.   All other systems reviewed and are negative.    Physical Exam Triage Vital Signs ED Triage Vitals  Enc Vitals Group     BP 04/15/17 1119 130/86     Pulse Rate 04/15/17 1119 (!) 50     Resp 04/15/17 1119 18     Temp 04/15/17 1119 98.4 F (36.9 C)     Temp src --      SpO2 04/15/17 1119 100 %     Weight --      Height --      Head Circumference --      Peak Flow --      Pain Score 04/15/17 1121 8     Pain Loc --      Pain Edu? --      Excl. in GC? --    No data found.  Updated Vital Signs BP 130/86   Pulse (!) 50   Temp 98.4 F (36.9 C)   Resp 18   LMP 10/24/2012   SpO2 100%   Visual Acuity Right Eye Distance:   Left Eye Distance:   Bilateral Distance:    Right Eye Near:   Left Eye Near:    Bilateral Near:     Physical Exam  Constitutional: She is oriented to person, place, and time. She appears well-developed and well-nourished. No distress.  HENT:  Head: Normocephalic and atraumatic.  Eyes: EOM are normal.  Neck: Normal range of motion. Neck supple.  Musculoskeletal: Normal range of motion. She exhibits no edema or deformity.  Tenderness and ecchymosis to the back of the right 5 and tox a mole calf. No appreciable swelling is seen. Positive for local tenderness. Patient is able to walk and bear full weight. Able to flex the knee and hip joints normally. Distal neurovascular motor sensory is grossly intact.  Lymphadenopathy:    She has no cervical adenopathy.  Neurological: She is alert and oriented to person, place, and time. No cranial nerve deficit.  Skin: Skin is warm and dry.  Psychiatric: She has a normal mood and affect.  Nursing note and vitals reviewed.    UC Treatments / Results  Labs (all labs ordered are listed, but only abnormal  results are displayed) Labs Reviewed - No data to display  EKG  EKG Interpretation None       Radiology No results found.  Procedures Procedures (including critical  care time)  Medications Ordered in UC Medications - No data to display   Initial Impression / Assessment and Plan / UC Course  I have reviewed the triage vital signs and the nursing notes.  Pertinent labs & imaging results that were available during my care of the patient were reviewed by me and considered in my medical decision making (see chart for details).    Take the medication as directed. Must follow-up with [facility] for additional medication.     Final Clinical Impressions(s) / UC Diagnoses   Final diagnoses:  Medication refill  Hamstring strain, left, subsequent encounter    ED Discharge Orders        Ordered    HYDROcodone-acetaminophen (NORCO) 7.5-325 MG tablet  Every 4 hours PRN     04/15/17 1217       Controlled Substance Prescriptions [place] Controlled Substance Registry consulted?  YES PMPAware researched.   Hayden Rasmussen, NP 04/15/17 1245

## 2017-04-15 NOTE — Discharge Instructions (Signed)
Take the medication as directed. Must follow-up with [facility] for additional medication.

## 2017-04-15 NOTE — Telephone Encounter (Signed)
Pt states pain to right knee and ankle is worse and pain is high 7-8 after pain medicine. Pt sent to [facility]. Pt is 12 years post kidney transplant  Reason for Disposition . [1] SEVERE pain (e.g., excruciating, unable to walk) AND [2] not improved after 2 hours of pain medicine  Answer Assessment - Initial Assessment Questions 1. LOCATION and RADIATION: "Where is the pain located?"      Right hip right knee 2. QUALITY: "What does the pain feel like?"  (e.g., sharp, dull, aching, burning)     Knee=dull and sharp with movement hip=dull and sharp with movement 3. SEVERITY: "How bad is the pain?" "What does it keep you from doing?"   (Scale 1-10; or mild, moderate, severe)   -  MILD (1-3): doesn't interfere with normal activities    -  MODERATE (4-7): interferes with normal activities (e.g., work or school) or awakens from sleep, limping    -  SEVERE (8-10): excruciating pain, unable to do any normal activities, unable to walk     Knee=8/10 hip=7-8/10 4. ONSET: "When did the pain start?" "Does it come and go, or is it there all the time?"     Sunday "it's there all the time" 5. RECURRENT: "Have you had this pain before?" If so, ask: "When, and what happened then?"     no 6. SETTING: "Has there been any recent work, exercise or other activity that involved that part of the body?"      Pt tripped and caught herself as she was falling developed right hip and knee pain as well as extensive bruising from buttocks to the right ankle 7. AGGRAVATING FACTORS: "What makes the knee pain worse?" (e.g., walking, climbing stairs, running)     Movement sitting up sitting down , tries to keep right knee straight 8. ASSOCIATED SYMPTOMS: "Is there any swelling or redness of the knee?"     Whole leg is swollen 9. OTHER SYMPTOMS: "Do you have any other symptoms?" (e.g., chest pain, difficulty breathing, fever, calf pain)     "little bit of bottom of the calf near my ankle" 10. PREGNANCY: "Is there any chance you  are pregnant?" "When was your last menstrual period?"       No no longer menstruating  Protocols used: KNEE PAIN-A-AH

## 2017-04-18 ENCOUNTER — Other Ambulatory Visit: Payer: Self-pay

## 2017-04-18 ENCOUNTER — Ambulatory Visit: Payer: BC Managed Care – PPO | Admitting: Physician Assistant

## 2017-04-18 ENCOUNTER — Ambulatory Visit (INDEPENDENT_AMBULATORY_CARE_PROVIDER_SITE_OTHER): Payer: BC Managed Care – PPO

## 2017-04-18 VITALS — BP 114/64 | HR 64 | Temp 98.1°F | Resp 16 | Ht 63.0 in | Wt 165.0 lb

## 2017-04-18 DIAGNOSIS — M25552 Pain in left hip: Secondary | ICD-10-CM

## 2017-04-18 DIAGNOSIS — S7001XA Contusion of right hip, initial encounter: Secondary | ICD-10-CM

## 2017-04-18 DIAGNOSIS — M25551 Pain in right hip: Secondary | ICD-10-CM | POA: Diagnosis not present

## 2017-04-18 MED ORDER — HYDROCODONE-ACETAMINOPHEN 7.5-325 MG PO TABS
1.0000 | ORAL_TABLET | ORAL | 0 refills | Status: DC | PRN
Start: 2017-04-18 — End: 2017-04-22

## 2017-04-18 NOTE — Patient Instructions (Addendum)
Please continue to perform small stretches.  I am referring you to ortho at this time.      IF you received an x-ray today, you will receive an invoice from [facility]. Please contact [facility] at [phone] with questions or concerns regarding your invoice.   IF you received labwork today, you will receive an invoice from [facility]. Please contact [facility] at [phone] with questions or concerns regarding your invoice.   Our billing staff will not be able to assist you with questions regarding bills from these companies.  You will be contacted with the lab results as soon as they are available. The fastest way to get your results is to activate your My Chart account. Instructions are located on the last page of this paperwork. If you have not heard from us regarding the results in 2 weeks, please contact this office.

## 2017-04-18 NOTE — Progress Notes (Signed)
[facility] [address] [place] [address], [place] [phone]  Date:  04/18/2017   Name:  Isabella Obrien   DOB:  18-Oct-1965   MRN:  [id number]  PCP:  System, Pcp Not In    History of Present Illness:  Isabella Obrien is a 51 y.o. female patient who presents to PCP with  Chief Complaint  Patient presents with  . Follow-up    hip/ pt states she feels a little better, but may need for meds for pain       Patient Active Problem List   Diagnosis Date Noted  . DM type 2 (diabetes mellitus, type 2) (HCC) 07/26/2011  . History of renal transplant 07/26/2011  . CRF (chronic renal failure) 06/10/2011  . HTN (hypertension) 06/10/2011  . Dyslipidemia 06/10/2011  . Immunosuppression (HCC) 06/10/2011  . Metabolic acidosis 06/10/2011    Past Medical History:  Diagnosis Date  . Bronchitis, allergic   . Chronic kidney disease    has kidney transplant - having aphoresis now every other week  . Diabetes mellitus without complication (HCC)    dm type 2 - fasting 150  . History of blood product transfusion   . Hypertension   . Neuromuscular disorder (HCC)   . Pneumonia    hx of    Past Surgical History:  Procedure Laterality Date  . ankle fracture Left    pins and rod in place  . COLONOSCOPY    . diateck catheter placement  09/2013  . KIDNEY TRANSPLANT      Social History   Tobacco Use  . Smoking status: Current Every Day Smoker    Packs/day: 0.50    Years: 30.00    Pack years: 15.00    Types: Cigarettes  Substance Use Topics  . Alcohol use: Yes    Comment: weekends  . Drug use: No    No family history on file.  Allergies  Allergen Reactions  . Benadryl [Diphenhydramine Hcl] Other (See Comments)    REACTION: anxiety, hyperactivity  . Ceftin Swelling  . Tape Rash    Paper tape ok.    Medication list has been reviewed and updated.  Current Outpatient Medications on File Prior to Visit  Medication Sig Dispense Refill  . albuterol (PROVENTIL  HFA;VENTOLIN HFA) 108 (90 BASE) MCG/ACT inhaler Inhale into the lungs every 6 (six) hours as needed for wheezing or shortness of breath.    Marland Kitchen. amLODipine (NORVASC) 5 MG tablet Take 5 mg by mouth daily.    Marland Kitchen. aspirin EC 81 MG tablet Take 81 mg by mouth daily.    Marland Kitchen. atenolol (TENORMIN) 100 MG tablet Take 100 mg by mouth daily.    Marland Kitchen. atorvastatin (LIPITOR) 40 MG tablet Take 40 mg by mouth daily.    . benazepril (LOTENSIN) 40 MG tablet Take 40 mg by mouth 2 (two) times daily.    . cyclobenzaprine (FLEXERIL) 10 MG tablet Take 0.5-1 tablets (5-10 mg total) 3 (three) times daily as needed by mouth. 30 tablet 0  . furosemide (LASIX) 20 MG tablet Take 20 mg by mouth every Monday, Wednesday, and Friday.    . metFORMIN (GLUCOPHAGE) 1000 MG tablet Take 1,000 mg by mouth 2 (two) times daily with a meal.    . mycophenolate (CELLCEPT) 500 MG tablet Take by mouth 2 (two) times daily.    . predniSONE (DELTASONE) 5 MG tablet Take 5 mg by mouth daily with breakfast.    . sodium bicarbonate 325 MG tablet Take 650  mg by mouth 3 (three) times daily.     . tacrolimus (PROGRAF) 1 MG capsule Take 1 mg by mouth 2 (two) times daily.    Marland Kitchen. HYDROcodone-acetaminophen (NORCO) 7.5-325 MG tablet Take 1 tablet by mouth every 4 (four) hours as needed. (Patient not taking: Reported on 04/18/2017) 12 tablet 0   No current facility-administered medications on file prior to visit.     ROS ROS otherwise unremarkable unless listed above.  Physical Examination: BP 114/64   Pulse 64   Temp 98.1 F (36.7 C) (Oral)   Resp 16   Ht 5\' 3"  (1.6 m)   Wt 165 lb (74.8 kg)   LMP 10/24/2012   SpO2 97%   BMI 29.23 kg/m  Ideal Body Weight: Weight in (lb) to have BMI = 25: 140.8  Physical Exam  Constitutional: She is oriented to person, place, and time. She appears well-developed and well-nourished. No distress.  HENT:  Head: Normocephalic and atraumatic.  Right Ear: External ear normal.  Left Ear: External ear normal.  Eyes:  Conjunctivae and EOM are normal. Pupils are equal, round, and reactive to light.  Cardiovascular: Normal rate.  Pulmonary/Chest: Effort normal. No respiratory distress.  Musculoskeletal:  Ecchymosis extends down the right gluteus down to the ankle with swelling prominent.  Tender at the right lateral gluteus.  rom limited with active extension, however this is better than last visit.  Tender at the right trochanter.  Bearing weight able, but with some pain.   Neurological: She is alert and oriented to person, place, and time.  Skin: She is not diaphoretic.  Psychiatric: She has a normal mood and affect. Her behavior is normal.    Dg Hip Unilat W Or W/o Pelvis 2-3 Views Right  Result Date: 04/18/2017 CLINICAL DATA:  right trochanter pain, and along the glute with ecchymosis initially at the glute but present throughout at this time. EXAM: DG HIP (WITH OR WITHOUT PELVIS) 2-3V RIGHT COMPARISON:  None. FINDINGS: Hips are located. No evidence of pelvic fracture or sacral fracture. Dedicated view of the RIGHT hip demonstrates no femoral neck fracture. IMPRESSION: No pelvic fracture or RIGHT hip fracture Electronically Signed   By: Genevive BiStewart  Edmunds M.D.   On: 04/18/2017 16:19    Assessment and Plan: Isabella Obrien is a 51 y.o. female who is here today for cc of  Chief Complaint  Patient presents with  . Follow-up    hip/ pt states she feels a little better, but may need for meds for pain   With such ecchymosis, and limited rom, there is still continued concern of possible avulsion fracture.  I am referring her to ortho at this time for consult.  PT vs further imaging, and I would like a specialist to decide in this pursuit.   At her age, hip involvement is also doubly concerning. Advised rest, elevation.  Referral placed today Traumatic ecchymosis of right hip, initial encounter - Plan: AMB referral to orthopedics  Left hip pain - Plan: AMB referral to orthopedics, CANCELED: DG HIP UNILAT W OR  W/O PELVIS 2-3 VIEWS LEFT  Right hip pain - Plan: DG HIP UNILAT W OR W/O PELVIS 2-3 VIEWS RIGHT, AMB referral to orthopedics  Trena PlattStephanie English, PA-C [facility] 11/27/20189:28 AM

## 2017-04-22 ENCOUNTER — Telehealth: Payer: Self-pay

## 2017-04-22 MED ORDER — HYDROCODONE-ACETAMINOPHEN 7.5-325 MG PO TABS: 1.0000 | ORAL_TABLET | ORAL | 0 refills | Status: DC | PRN

## 2017-04-22 NOTE — Telephone Encounter (Signed)
It is ordered, please advise to pick up.

## 2017-04-22 NOTE — Telephone Encounter (Signed)
LVM that rx from norco is ready for pickup at 102

## 2017-04-22 NOTE — Telephone Encounter (Signed)
LVM that rx is ready for pickup at 102

## 2017-04-22 NOTE — Telephone Encounter (Signed)
Pt requesting refill on Norco. Please advise.

## 2017-04-22 NOTE — Telephone Encounter (Signed)
Pt states that she is out of the medication and is in pain, adv pt I would let English know. And that a med refill request may take up to 48 business hours, pt states that is not going to cut in b/c she is out and in pain.

## 2017-04-26 ENCOUNTER — Other Ambulatory Visit (HOSPITAL_COMMUNITY): Payer: Self-pay | Admitting: Orthopedic Surgery

## 2017-04-26 DIAGNOSIS — M7989 Other specified soft tissue disorders: Principal | ICD-10-CM

## 2017-04-26 DIAGNOSIS — M79604 Pain in right leg: Secondary | ICD-10-CM

## 2017-04-27 ENCOUNTER — Ambulatory Visit (HOSPITAL_COMMUNITY)
Admission: RE | Admit: 2017-04-27 | Discharge: 2017-04-27 | Disposition: A | Discharge status: Home / Self Care | Payer: BC Managed Care – PPO | Source: Ambulatory Visit | Attending: Orthopedic Surgery | Admitting: Orthopedic Surgery

## 2017-04-27 DIAGNOSIS — M79604 Pain in right leg: Secondary | ICD-10-CM | POA: Diagnosis not present

## 2017-04-27 DIAGNOSIS — M7989 Other specified soft tissue disorders: Secondary | ICD-10-CM | POA: Diagnosis not present

## 2017-04-27 NOTE — Progress Notes (Signed)
Right lower extremity venous duplex has been completed. Negative for DVT. Results were faxed to Dr. Candise Bowensaffrey's office.  04/27/17 3:33 PM Olen CordialGreg Johnluke Haugen RVT

## 2017-04-30 ENCOUNTER — Telehealth: Payer: Self-pay | Admitting: Physician Assistant

## 2017-04-30 NOTE — Telephone Encounter (Signed)
Please contact patient.  Reading the referral from murphy wainer, appeared like a possible dvt.  Did they do a doppler? Can you check with patient, and see if she can follow up here or her pcp.

## 2017-05-05 NOTE — Telephone Encounter (Signed)
Left detailed message advising pt to contact us regarding who she will be following up with.

## 2017-08-10 LAB — LIPID PANEL
Cholesterol: 122 (ref 0–200)
HDL: 39 (ref 35–70)
LDL Cholesterol: 57
TRIGLYCERIDES: 283 — AB (ref 40–160)

## 2017-08-10 LAB — HEMOGLOBIN A1C: Hemoglobin A1C: 7.2

## 2017-08-10 LAB — CBC AND DIFFERENTIAL
HEMATOCRIT: 39 (ref 36–46)
HEMOGLOBIN: 12.9 (ref 12.0–16.0)

## 2017-08-10 LAB — BASIC METABOLIC PANEL
BUN: 25 — AB (ref 4–21)
Creatinine: 1.2 — AB (ref <=1.1)
Glucose: 124
Potassium: 4.9 (ref 3.4–5.3)
Sodium: 140 (ref 137–147)

## 2017-08-10 LAB — HEPATIC FUNCTION PANEL
ALT: 13 (ref 7–35)
AST: 9 — AB (ref 13–35)

## 2017-08-31 ENCOUNTER — Encounter: Payer: Self-pay | Admitting: Physician Assistant

## 2017-09-06 ENCOUNTER — Encounter: Payer: Self-pay | Admitting: Family Medicine

## 2017-09-06 ENCOUNTER — Ambulatory Visit: Payer: BC Managed Care – PPO | Admitting: Family Medicine

## 2017-09-06 ENCOUNTER — Other Ambulatory Visit: Payer: Self-pay

## 2017-09-06 VITALS — BP 122/84 | HR 57 | Temp 98.1°F | Resp 15 | Ht 63.0 in | Wt 161.8 lb

## 2017-09-06 DIAGNOSIS — Z1212 Encounter for screening for malignant neoplasm of rectum: Secondary | ICD-10-CM | POA: Diagnosis not present

## 2017-09-06 DIAGNOSIS — E1169 Type 2 diabetes mellitus with other specified complication: Secondary | ICD-10-CM

## 2017-09-06 DIAGNOSIS — E782 Mixed hyperlipidemia: Secondary | ICD-10-CM | POA: Diagnosis not present

## 2017-09-06 DIAGNOSIS — E139 Other specified diabetes mellitus without complications: Secondary | ICD-10-CM | POA: Diagnosis not present

## 2017-09-06 DIAGNOSIS — Z1211 Encounter for screening for malignant neoplasm of colon: Secondary | ICD-10-CM | POA: Diagnosis not present

## 2017-09-06 DIAGNOSIS — N951 Menopausal and female climacteric states: Secondary | ICD-10-CM | POA: Diagnosis not present

## 2017-09-06 DIAGNOSIS — Z940 Kidney transplant status: Secondary | ICD-10-CM | POA: Diagnosis not present

## 2017-09-06 DIAGNOSIS — Z7960 Long term (current) use of unspecified immunomodulators and immunosuppressants: Secondary | ICD-10-CM

## 2017-09-06 DIAGNOSIS — N183 Chronic kidney disease, stage 3 unspecified: Secondary | ICD-10-CM

## 2017-09-06 DIAGNOSIS — I151 Hypertension secondary to other renal disorders: Secondary | ICD-10-CM | POA: Diagnosis not present

## 2017-09-06 DIAGNOSIS — N2589 Other disorders resulting from impaired renal tubular function: Secondary | ICD-10-CM

## 2017-09-06 DIAGNOSIS — Z79899 Other long term (current) drug therapy: Secondary | ICD-10-CM | POA: Diagnosis not present

## 2017-09-06 HISTORY — DX: Menopausal and female climacteric states: N95.1

## 2017-09-06 LAB — POCT GLYCOSYLATED HEMOGLOBIN (HGB A1C)

## 2017-09-06 NOTE — Patient Instructions (Addendum)
It was so good seeing you again! Thank you for establishing with my new practice and allowing me to continue caring for you. It means a lot to me.   Please schedule a follow up appointment with me in 3 months to recheck diabetes and f/u on lab result review from Dr. Sharl MaKerr.  We will call you with information regarding your referral appointment. Dr. Sharrell KuJeffrey Medoff for colonoscopy.  Please schedule your diabetic eye exam and have provider send me a copy of the results. (Dr. Elmer PickerHecker or Emily FilbertGould)   Diabetes Mellitus and Nutrition When you have diabetes (diabetes mellitus), it is very important to have healthy eating habits because your blood sugar (glucose) levels are greatly affected by what you eat and drink. Eating healthy foods in the appropriate amounts, at about the same times every day, can help you:  Control your blood glucose.  Lower your risk of heart disease.  Improve your blood pressure.  Reach or maintain a healthy weight.  Every person with diabetes is different, and each person has different needs for a meal plan. Your health care provider may recommend that you work with a diet and nutrition specialist (dietitian) to make a meal plan that is best for you. Your meal plan may vary depending on factors such as:  The calories you need.  The medicines you take.  Your weight.  Your blood glucose, blood pressure, and cholesterol levels.  Your activity level.  Other health conditions you have, such as heart or kidney disease.  How do carbohydrates affect me? Carbohydrates affect your blood glucose level more than any other type of food. Eating carbohydrates naturally increases the amount of glucose in your blood. Carbohydrate counting is a method for keeping track of how many carbohydrates you eat. Counting carbohydrates is important to keep your blood glucose at a healthy level, especially if you use insulin or take certain oral diabetes medicines. It is important to know how many  carbohydrates you can safely have in each meal. This is different for every person. Your dietitian can help you calculate how many carbohydrates you should have at each meal and for snack. Foods that contain carbohydrates include:  Bread, cereal, rice, pasta, and crackers.  Potatoes and corn.  Peas, beans, and lentils.  Milk and yogurt.  Fruit and juice.  Desserts, such as cakes, cookies, ice cream, and candy.  How does alcohol affect me? Alcohol can cause a sudden decrease in blood glucose (hypoglycemia), especially if you use insulin or take certain oral diabetes medicines. Hypoglycemia can be a life-threatening condition. Symptoms of hypoglycemia (sleepiness, dizziness, and confusion) are similar to symptoms of having too much alcohol. If your health care provider says that alcohol is safe for you, follow these guidelines:  Limit alcohol intake to no more than 1 drink per day for nonpregnant women and 2 drinks per day for men. One drink equals 12 oz of beer, 5 oz of wine, or 1 oz of hard liquor.  Do not drink on an empty stomach.  Keep yourself hydrated with water, diet soda, or unsweetened iced tea.  Keep in mind that regular soda, juice, and other mixers may contain a lot of sugar and must be counted as carbohydrates.  What are tips for following this plan? Reading food labels  Start by checking the serving size on the label. The amount of calories, carbohydrates, fats, and other nutrients listed on the label are based on one serving of the food. Many foods contain more than one  serving per package.  Check the total grams (g) of carbohydrates in one serving. You can calculate the number of servings of carbohydrates in one serving by dividing the total carbohydrates by 15. For example, if a food has 30 g of total carbohydrates, it would be equal to 2 servings of carbohydrates.  Check the number of grams (g) of saturated and trans fats in one serving. Choose foods that have low  or no amount of these fats.  Check the number of milligrams (mg) of sodium in one serving. Most people should limit total sodium intake to less than 2,300 mg per day.  Always check the nutrition information of foods labeled as "low-fat" or "nonfat". These foods may be higher in added sugar or refined carbohydrates and should be avoided.  Talk to your dietitian to identify your daily goals for nutrients listed on the label. Shopping  Avoid buying canned, premade, or processed foods. These foods tend to be high in fat, sodium, and added sugar.  Shop around the outside edge of the grocery store. This includes fresh fruits and vegetables, bulk grains, fresh meats, and fresh dairy. Cooking  Use low-heat cooking methods, such as baking, instead of high-heat cooking methods like deep frying.  Cook using healthy oils, such as olive, canola, or sunflower oil.  Avoid cooking with butter, cream, or high-fat meats. Meal planning  Eat meals and snacks regularly, preferably at the same times every day. Avoid going long periods of time without eating.  Eat foods high in fiber, such as fresh fruits, vegetables, beans, and whole grains. Talk to your dietitian about how many servings of carbohydrates you can eat at each meal.  Eat 4-6 ounces of lean protein each day, such as lean meat, chicken, fish, eggs, or tofu. 1 ounce is equal to 1 ounce of meat, chicken, or fish, 1 egg, or 1/4 cup of tofu.  Eat some foods each day that contain healthy fats, such as avocado, nuts, seeds, and fish. Lifestyle   Check your blood glucose regularly.  Exercise at least 30 minutes 5 or more days each week, or as told by your health care provider.  Take medicines as told by your health care provider.  Do not use any products that contain nicotine or tobacco, such as cigarettes and e-cigarettes. If you need help quitting, ask your health care provider.  Work with a Veterinary surgeon or diabetes educator to identify  strategies to manage stress and any emotional and social challenges. What are some questions to ask my health care provider?  Do I need to meet with a diabetes educator?  Do I need to meet with a dietitian?  What number can I call if I have questions?  When are the best times to check my blood glucose? Where to find more information:  American Diabetes Association: diabetes.org/food-and-fitness/food  Academy of Nutrition and Dietetics: https://www.vargas.com/  [employer] of Diabetes and Digestive and Kidney Diseases (NIH): FindJewelers.cz Summary  A healthy meal plan will help you control your blood glucose and maintain a healthy lifestyle.  Working with a diet and nutrition specialist (dietitian) can help you make a meal plan that is best for you.  Keep in mind that carbohydrates and alcohol have immediate effects on your blood glucose levels. It is important to count carbohydrates and to use alcohol carefully. This information is not intended to replace advice given to you by your health care provider. Make sure you discuss any questions you have with your health care provider. Document  Released: 02/04/2005 Document Revised: 06/14/2016 Document Reviewed: 06/14/2016 Elsevier Interactive Patient Education  [employer].

## 2017-09-06 NOTE — Progress Notes (Signed)
Subjective  CC:  Chief Complaint  Patient presents with  . Establish Care    Wants to [facility] care, does not feel that her Type 2 diabetes medication is working well    HPI: Isabella Obrien is a 52 y.o. female who presents to [place] at [facility] today to establish care with me as a new patient.  She is a former [facility] patient and is here to reestablish care with me today. I last saw her in 2016 and she never followed up with me. She has a complicated PMH and multiple providers but has done well. She needs a PCP to coordinate her care. She feels well.   She has the following concerns or needs:  Post transplant type 2 diabetes: last saw Dr. Buddy Duty 2 months ago after a 13 month interval. No records are available for review. She was on metformin after running out of tradjenta months prior. She can't remember her a1c; she was changed to [place] for insurance reasons. Since, sugars are marginally controlled with most fastings around 120-140 and most postprandials 150s but does have several very elevated readings. She feels ok. No sxs of peripheral neuropathy. She reports that dr. Buddy Duty did complete blood work 2 months ago.  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 Lorrene Reid; she reports her kidney function has been stable over the last several years; she sees renal q 3 months and has blood work done as well. Admits to some LE swelling w/o pain or redness. No SOB or orthopnea.   Hyperlipidemia on statin. Reports controlled. No myalgias.   Renal transplant - lost to f/u at [facility]. Last seen in 2016.   HTN - well controlled.   Hot flushes managed with lexapro. Doing well. No mood problems.   HM: due for crc screen; pap and mammo are up to date.   We updated and reviewed the patient's past history in detail and it is documented below.  Patient Active Problem List   Diagnosis Date Noted  . Post-transplant diabetes  mellitus ([place]) 07/30/2013    Priority: High    Overview:  Managed by Dr. Buddy Duty, endocrine   . Combined hyperlipidemia associated with type 2 diabetes mellitus ([place]) 07/18/2013    Priority: High  . 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    Priority: High    Overview:  S/p living donor transplant, brother   . CKD (chronic kidney disease) 07/18/2013    Priority: High    Overview:  Managed by Dr. Lorrene Reid   . 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 History of living-donor kidney transplantation 04/19/2011    Priority: High    brother   . Long-term use of immunosuppressant medication 04/19/2011    Priority: High  . 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 Proteinuria 07/18/2013    Priority: Medium  . Menopausal hot flushes 09/06/2017    Priority: Low    Controlled on Lexapro by Dr. Corinna Capra   . Sciatica of right side 07/18/2013    Overview:  Due to cyst causing L5-S1 nerve irritation - resolved with epidural steroid injection 09/2013, dr. Lauralyn Primes ortho/cla 10/2013     Health Maintenance  Topic Date Due  . HEMOGLOBIN A1C  01/26/2012  . COLONOSCOPY  07/31/2017  . OPHTHALMOLOGY EXAM  08/22/2017  . INFLUENZA VACCINE  12/22/2017  . PNEUMOCOCCAL POLYSACCHARIDE VACCINE (  2) 05/24/2018  . FOOT EXAM  06/24/2018  . MAMMOGRAM  12/23/2018  . PAP SMEAR  12/23/2019  . TETANUS/TDAP  09/22/2024  . HIV Screening  Completed   Immunization History  Administered Date(s) Administered  . Influenza Split 05/24/2013  . Influenza,inj,Quad PF,6+ Mos 04/12/2011  . Pneumococcal Conjugate-13 09/23/2014  . Pneumococcal Polysaccharide-23 05/24/2013  . Tdap 09/23/2014   Current Meds  Medication Sig  . albuterol (PROVENTIL HFA;VENTOLIN HFA) 108 (90 BASE) MCG/ACT inhaler Inhale into the lungs every 6 (six) hours as needed for wheezing or shortness of breath.  Marland Kitchen amLODipine (NORVASC) 5 MG tablet Take 5 mg by mouth daily.  Marland Kitchen aspirin EC 81  MG tablet Take 81 mg by mouth daily.  Marland Kitchen atenolol (TENORMIN) 100 MG tablet Take 100 mg by mouth daily.  Marland Kitchen atorvastatin (LIPITOR) 40 MG tablet Take 40 mg by mouth daily.  . benazepril (LOTENSIN) 40 MG tablet Take 40 mg by mouth 2 (two) times daily.  Marland Kitchen escitalopram (LEXAPRO) 20 MG tablet Take 20 mg by mouth daily.  . furosemide (LASIX) 40 MG tablet Take 40 mg by mouth daily.  Marland Kitchen ipratropium (ATROVENT) 0.02 % nebulizer solution Take 2.5 mLs (0.5 mg total) by nebulization 2 times daily.  Marland Kitchen JANUVIA 100 MG tablet   . metFORMIN (GLUCOPHAGE) 1000 MG tablet Take 1,000 mg by mouth 2 (two) times daily with a meal.  . mycophenolate (CELLCEPT) 500 MG tablet Take by mouth 2 (two) times daily.  . Omega-3 Fatty Acids (FISH OIL) 1000 MG CAPS Take 2 capsules by mouth daily.  . predniSONE (DELTASONE) 5 MG tablet Take 5 mg by mouth daily with breakfast.  . sodium bicarbonate 325 MG tablet Take 650 mg by mouth 3 (three) times daily.   . tacrolimus (PROGRAF) 1 MG capsule Take 1 mg by mouth. Takes 1 mg in the AM and takes half (0.5) mg in PM  . zolpidem (AMBIEN) 10 MG tablet TK 1 T PO QD HS PRN FOR SLEEP.    Allergies: Patient is allergic to cefuroxime; benadryl [diphenhydramine hcl]; ceftin; and tape. Past Medical History Patient  has a past medical history of Chronic kidney disease, Diabetes mellitus without complication ([place]), History of blood product transfusion, Hyperlipidemia, Hypertension, Menopausal hot flushes (09/06/2017), and Neuromuscular disorder ([place]). Past Surgical History Patient  has a past surgical history that includes Kidney transplant; Colonoscopy; diateck catheter placement (09/2013); and ORIF ankle fracture (Left, 2014). Family History: Patient family history includes Healthy in her brother; Hypertension in her mother; Other (age of onset: 67) in her father. Social History:  Patient  reports that she quit smoking about 20 months ago. Her smoking use included cigarettes. She quit after 30.00  years of use. She has never used smokeless tobacco. She reports that she drinks about 0.6 oz of alcohol per week. She reports that she does not use drugs.  Review of Systems: Constitutional: negative for fever or malaise Ophthalmic: negative for photophobia, double vision or loss of vision Cardiovascular: negative for chest pain, dyspnea on exertion, or new LE swelling Respiratory: negative for SOB or persistent cough Gastrointestinal: negative for abdominal pain, change in bowel habits or melena Genitourinary: negative for dysuria or gross hematuria Musculoskeletal: negative for new gait disturbance or muscular weakness Integumentary: negative for new or persistent rashes Neurological: negative for TIA or stroke symptoms Psychiatric: negative for SI or delusions Allergic/Immunologic: negative for hives  Patient Care Team    Relationship Specialty Notifications Start End  Leamon Arnt, MD PCP - General Family  Medicine  09/06/17   Delrae Rend, MD Consulting Physician Endocrinology  09/06/17   Jamal Maes, MD Consulting Physician Nephrology  09/06/17   Nolon Lennert, MD Referring Physician Surgery  09/06/17   Richmond Campbell, MD Consulting Physician Gastroenterology  09/06/17   Louretta Shorten, MD Consulting Physician Obstetrics and Gynecology  09/06/17     Objective  Vitals: BP 122/84   Pulse (!) 57   Temp 98.1 F (36.7 C) (Oral)   Resp 15   Ht 5' 3"  (1.6 m)   Wt 161 lb 12.8 oz (73.4 kg)   LMP 10/24/2012   SpO2 98%   BMI 28.66 kg/m  General:  Well developed, well nourished, no acute distress  Psych:  Alert and oriented,normal mood and affect HEENT:  Normocephalic, atraumatic, non-icteric sclera, PERRL, oropharynx is without mass or exudate, supple neck  Cardiovascular:  RRR without gallop, rub or murmur, nondisplaced PMI, tr pitting edema BLE Respiratory:  Good breath sounds bilaterally, CTAB with normal respiratory effort MSK: no deformities, contusions. Joints are without  erythema or swelling Skin:  Warm, no rashes  Neurologic:    Mental status is normal. Gross motor exam normal. Normal gait Diabetic Foot Exam: Appearance - no lesions, ulcers or calluses Skin - no sigificant pallor or erythema Monofilament testing - sensitive bilaterally in following locations:  Right - Great toe, medial, central, lateral ball and posterior foot intact  Left - Great toe, medial, central, lateral ball and posterior foot intact Pulses - +2 distally bilaterally   Assessment  1. Post-transplant diabetes mellitus ([place])   2. 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 History of living-donor kidney transplantation   4. Renal tubular acidosis   5. Menopausal hot flushes   6. Combined hyperlipidemia associated with type 2 diabetes mellitus ([place])   7. Long-term use of immunosuppressant medication   8. Stage 3 chronic kidney disease ([place])   9. Screening for colorectal cancer      Plan   Diabetic control is fair. Has only been on [place] with met for last 6 weeks. Continue same and work on diet. Continue checking bid sugars and bring in log for review. Will add meds at next visit if a1c remains > 6.5. Would be good toujeo or lantus candidate. Recommend scheduling eye exam. imms are up to date.   HTN: needs excellent control. Continue same meds. Request labwork from renal/endocrine  CKD per Dr. Lorrene Reid.   Lipids: reportedly at goal on statin. Will get records.   Immunosuppressed: rec f/u with transplant team at [facility].   Refer to GI for colonoscopy.  Had discussion regarding goals of care, understanding her management care plan, and close f/u. Pt agrees with careplan.   Follow up:  Return in about 3 months (around 12/06/2017) for follow up of diabetes and hypertension.  Commons side effects, risks, benefits, and alternatives for medications and treatment plan prescribed today were discussed, and the patient expressed understanding of the given instructions. Patient is  instructed to call or message via MyChart if he/she has any questions or concerns regarding our treatment plan. No barriers to understanding were identified. We discussed Red Flag symptoms and signs in detail. Patient expressed understanding regarding what to do in case of urgent or emergency type symptoms.   Medication list was reconciled, printed and provided to the patient in AVS. Patient instructions and summary information was reviewed with the patient as documented in the AVS. This note was prepared with assistance of Dragon voice recognition software. Occasional wrong-word or  sound-a-like substitutions may have occurred due to the inherent limitations of voice recognition software  Orders Placed This Encounter  Procedures  . Ambulatory referral to Gastroenterology  . POCT glycosylated hemoglobin (Hb A1C)   No orders of the defined types were placed in this encounter.

## 2017-09-13 ENCOUNTER — Encounter: Payer: Self-pay | Admitting: Emergency Medicine

## 2017-10-03 LAB — HM COLONOSCOPY

## 2017-12-05 ENCOUNTER — Ambulatory Visit: Payer: BC Managed Care – PPO | Admitting: Family Medicine

## 2017-12-12 ENCOUNTER — Ambulatory Visit: Payer: BC Managed Care – PPO | Admitting: Family Medicine

## 2017-12-20 ENCOUNTER — Other Ambulatory Visit: Payer: Self-pay

## 2017-12-20 ENCOUNTER — Ambulatory Visit (INDEPENDENT_AMBULATORY_CARE_PROVIDER_SITE_OTHER): Payer: BC Managed Care – PPO | Admitting: Family Medicine

## 2017-12-20 ENCOUNTER — Encounter: Payer: Self-pay | Admitting: Family Medicine

## 2017-12-20 VITALS — BP 120/76 | HR 63 | Temp 98.4°F | Ht 63.0 in | Wt 159.2 lb

## 2017-12-20 DIAGNOSIS — I151 Hypertension secondary to other renal disorders: Secondary | ICD-10-CM

## 2017-12-20 DIAGNOSIS — Z940 Kidney transplant status: Secondary | ICD-10-CM

## 2017-12-20 DIAGNOSIS — E1169 Type 2 diabetes mellitus with other specified complication: Secondary | ICD-10-CM

## 2017-12-20 DIAGNOSIS — K648 Other hemorrhoids: Secondary | ICD-10-CM

## 2017-12-20 DIAGNOSIS — E782 Mixed hyperlipidemia: Secondary | ICD-10-CM

## 2017-12-20 DIAGNOSIS — E139 Other specified diabetes mellitus without complications: Secondary | ICD-10-CM

## 2017-12-20 LAB — POCT GLYCOSYLATED HEMOGLOBIN (HGB A1C): Hemoglobin A1C: 6.6 % — AB (ref 4.0–5.6)

## 2017-12-20 NOTE — Progress Notes (Signed)
Subjective  CC:  Chief Complaint  Patient presents with  . Diabetes    3 month f/u last A1c 09/06/2017    HPI: Isabella Obrien is a 52 y.o. female who presents to the office today for follow up of diabetes and problems listed above in the chief complaint.   Diabetes follow up: Her diabetic control is reported as Improved. Home fastings are averaging 120 or so; postprandials are < 160 routinely.  She denies exertional CP or SOB or symptomatic hypoglycemia. She denies foot sores or paresthesias. Needs eye exam.  CKD: stable. To see renal next month.  No edema  HLD and BP are controlled on meds.   Pt reports colonoscopy by dr. Kinnie Scales- reports hemorrhoids only. 09/2017  Assessment  1. Post-transplant diabetes mellitus (HCC)   2. Combined hyperlipidemia associated with type 2 diabetes mellitus (HCC)   3. Hypertension due to kidney transplant   4. History of living-donor kidney transplantation   5. Internal hemorrhoids      Plan   Diabetes is currently very well controlled. Continue meds as is. imms up to date. Needs eye exam. Lipids at goal. Renal function stable per renal. No  Changes today.   Will monitor renal function with renal: on metformin.  Follow up: Return in about 3 months (around 03/22/2018) for complete physical, follow up of diabetes and hypertension.. Orders Placed This Encounter  Procedures  . POCT glycosylated hemoglobin (Hb A1C)  . HM COLONOSCOPY   No orders of the defined types were placed in this encounter.     Immunization History  Administered Date(s) Administered  . Influenza Split 05/24/2013  . Influenza,inj,Quad PF,6+ Mos 04/12/2011  . Pneumococcal Conjugate-13 09/23/2014  . Pneumococcal Polysaccharide-23 05/24/2013  . Tdap 09/23/2014    Diabetes Related Lab Review: Lab Results  Component Value Date   HGBA1C 6.6 (A) 12/20/2017   HGBA1C 7.2cla 09/06/2017   HGBA1C 7.2 08/10/2017    No results found for: [facility] Lab Results    Component Value Date   CREATININE 1.2 (A) 08/10/2017   BUN 25 (A) 08/10/2017   NA 140 08/10/2017   K 4.9 08/10/2017   CL 103 12/20/2013   CO2 22 12/20/2013   Lab Results  Component Value Date   CHOL 122 08/10/2017   CHOL 184 07/26/2011   Lab Results  Component Value Date   HDL 39 08/10/2017   HDL 43 07/26/2011   Lab Results  Component Value Date   LDLCALC 57 08/10/2017   LDLCALC 72 07/26/2011   Lab Results  Component Value Date   TRIG 283 (A) 08/10/2017   TRIG 347 (H) 07/26/2011   Lab Results  Component Value Date   CHOLHDL 4.3 07/26/2011   No results found for: LDLDIRECT The ASCVD Risk score Denman George DC Jr., et al., 2013) failed to calculate for the following reasons:   The valid total cholesterol range is 130 to 320 mg/dL I have reviewed the PMH, Fam and Soc history. Patient Active Problem List   Diagnosis Date Noted  . 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    Priority: High    Overview:  Managed by Dr. Sharl Ma, endocrine   . Combined hyperlipidemia associated with type 2 diabetes mellitus (HCC) 07/18/2013    Priority: High  . 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    Priority: High    Overview:  S/p living donor transplant, brother   . CKD (chronic kidney disease) 07/18/2013    Priority: High    Overview:  Managed  by Dr. Eliott Nine   . 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 History of living-donor kidney transplantation 04/19/2011    Priority: High    brother   . Long-term use of immunosuppressant medication 04/19/2011    Priority: High  . 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 Proteinuria 07/18/2013    Priority: Medium  . Menopausal hot flushes 09/06/2017    Priority: Low    Controlled on Lexapro by Dr. Rana Snare   . Internal hemorrhoids 12/20/2017    Colonoscopy with hemorrhoids 09/2017 Dr. Troy Sine;    Marland Kitchen Sciatica of right side 07/18/2013    Overview:  Due to cyst causing  L5-S1 nerve irritation - resolved with epidural steroid injection 09/2013, dr. Regino Schultze, [facility] 10/2013     Social History: Patient  reports that she quit smoking about 1 years ago. Her smoking use included cigarettes. She quit after 30.00 years of use. She has never used smokeless tobacco. She reports that she drinks about 0.6 oz of alcohol per week. She reports that she does not use drugs.  Review of Systems: Ophthalmic: negative for eye pain, loss of vision or double vision Cardiovascular: negative for chest pain Respiratory: negative for SOB or persistent cough Gastrointestinal: negative for abdominal pain Genitourinary: negative for dysuria or gross hematuria MSK: negative for foot lesions Neurologic: negative for weakness or gait disturbance  Objective  Vitals: BP 120/76   Pulse 63   Temp 98.4 F (36.9 C)   Ht 5\' 3"  (1.6 m)   Wt 159 lb 3.2 oz (72.2 kg)   LMP 10/24/2012   SpO2 98%   BMI 28.20 kg/m  General: well appearing, no acute distress  Psych:  Alert and oriented, normal mood and affect HEENT:  Normocephalic, atraumatic, moist mucous membranes, supple neck  Cardiovascular:  Nl S1 and S2, RRR without murmur, gallop or rub. no edema Respiratory:  Good breath sounds bilaterally, CTAB with normal effort, no rales Gastrointestinal: normal BS, soft, nontender, palpable tx kidney right lower quadrant w/o ttp Skin:  Warm, no rashes Neurologic:   Mental status is normal. normal gait Foot exam: no erythema, pallor, or cyanosis visible nl proprioception and sensation to monofilament testing bilaterally, +2 distal pulses bilaterally    Diabetic education: ongoing education regarding chronic disease management for diabetes was given today. We continue to reinforce the ABC's of diabetic management: A1c (<7 or 8 dependent upon patient), tight blood pressure control, and cholesterol management with goal LDL < 100 minimally. We discuss diet strategies, exercise recommendations,  medication options and possible side effects. At each visit, we review recommended immunizations and preventive care recommendations for diabetics and stress that good diabetic control can prevent other problems. See below for this patient's data.    Commons side effects, risks, benefits, and alternatives for medications and treatment plan prescribed today were discussed, and the patient expressed understanding of the given instructions. Patient is instructed to call or message via MyChart if he/she has any questions or concerns regarding our treatment plan. No barriers to understanding were identified. We discussed Red Flag symptoms and signs in detail. Patient expressed understanding regarding what to do in case of urgent or emergency type symptoms.   Medication list was reconciled, printed and provided to the patient in AVS. Patient instructions and summary information was reviewed with the patient as documented in the AVS. This note was prepared with assistance of Dragon voice recognition software. Occasional wrong-word or sound-a-like substitutions may  have occurred due to the inherent limitations of voice recognition software

## 2017-12-20 NOTE — Patient Instructions (Addendum)
Please return in 3 months for diabetes and blood pressure follow up.  Please set up an appointment for a diabetic eye exam and have the results sent to me.   If you have any questions or concerns, please don't hesitate to send me a message via MyChart or call the office at [phone]. Thank you for visiting with Isabella Obrien today! It's our pleasure caring for you.  Things look great with your diabetes. Keep it up!

## 2018-03-20 ENCOUNTER — Other Ambulatory Visit: Payer: Self-pay

## 2018-03-20 ENCOUNTER — Encounter: Payer: Self-pay | Admitting: Family Medicine

## 2018-03-20 ENCOUNTER — Ambulatory Visit: Payer: BC Managed Care – PPO | Admitting: Family Medicine

## 2018-03-20 VITALS — BP 126/80 | HR 97 | Temp 98.5°F | Resp 16 | Ht 63.0 in | Wt 160.0 lb

## 2018-03-20 DIAGNOSIS — Z7960 Long term (current) use of unspecified immunomodulators and immunosuppressants: Secondary | ICD-10-CM

## 2018-03-20 DIAGNOSIS — J9801 Acute bronchospasm: Secondary | ICD-10-CM

## 2018-03-20 DIAGNOSIS — N183 Chronic kidney disease, stage 3 unspecified: Secondary | ICD-10-CM

## 2018-03-20 DIAGNOSIS — E139 Other specified diabetes mellitus without complications: Secondary | ICD-10-CM

## 2018-03-20 DIAGNOSIS — J189 Pneumonia, unspecified organism: Secondary | ICD-10-CM

## 2018-03-20 DIAGNOSIS — Z79899 Other long term (current) drug therapy: Secondary | ICD-10-CM

## 2018-03-20 LAB — POCT GLYCOSYLATED HEMOGLOBIN (HGB A1C): HBA1C, POC (PREDIABETIC RANGE): 6.8 % — AB (ref 5.7–6.4)

## 2018-03-20 MED ORDER — PREDNISONE 10 MG PO TABS: ORAL_TABLET | ORAL | 0 refills | Status: DC

## 2018-03-20 MED ORDER — ALBUTEROL SULFATE (2.5 MG/3ML) 0.083% IN NEBU: 2.5000 mg | INHALATION_SOLUTION | Freq: Once | RESPIRATORY_TRACT | Status: DC

## 2018-03-20 MED ORDER — GUAIFENESIN-CODEINE 100-10 MG/5ML PO SOLN: 5.0000 mL | Freq: Four times a day (QID) | ORAL | 0 refills | Status: DC | PRN

## 2018-03-20 MED ORDER — MOXIFLOXACIN HCL 400 MG PO TABS: 400.0000 mg | ORAL_TABLET | Freq: Every day | ORAL | 0 refills | Status: DC

## 2018-03-20 NOTE — Patient Instructions (Addendum)
Please return in 3 months for your annual complete physical; please come fasting.  Please return in 1-2 weeks if your breathing is not improved.   Please go to our [facility] office to get your xrays done. You can walk in M-F between 8am and 5pm. Tell them you are there for xrays ordered by me. They will send me the results, then I will let you know the results with instructions.   Address: [address] [place], [place], [place] [phone]  (office sits at [address] at [address] intersection; from here, turn left onto [place] [address]), take to [address], turn right and go for a mile or so, office will be on left across form [employer] [address] )   If you have any questions or concerns, please don't hesitate to send me a message via MyChart or call the office at [phone]. Thank you for visiting with [place] today! It's our pleasure caring for you.

## 2018-03-20 NOTE — Progress Notes (Signed)
Subjective  CC:  Chief Complaint  Patient presents with  . Diabetes  . Cough  . Chronic Kidney Disease    HPI: Isabella Obrien is a 52 y.o. female who presents to the office today for follow up of diabetes and problems listed above in the chief complaint.   Diabetes follow up: Her diabetic control is reported as Unchanged.  She is tolerating her medications. She denies exertional CP or SOB or symptomatic hypoglycemia. She denies foot sores or paresthesias.   Reports 1 week history of illness including congestion in the lungs, dyspnea on exertion, feeling warm but not documenting a fever, malaise and postnasal drainage.  No significant sore throat.  No ear pain.  Appetite is fair.  Has been using home albuterol without significant relief.  Chronic kidney disease status post living donor kidney: Reviewed recent nephrology notes.  Stable.  Assessment  1. Community acquired pneumonia, unspecified laterality   2. Post-transplant diabetes mellitus (HCC)   3. Stage 3 chronic kidney disease (HCC)   4. Bronchospasm   5. Long-term use of immunosuppressant medication      Plan    Presumed pneumonia given clinical exam and history and immunosuppression: Treat aggressively with Avelox, albuterol nebs, prednisone taper and cough syrups.  Close follow-up.  Sent for chest x-ray.  Recheck 1 to 2 weeks.  Sooner if problems.  Counseling and education done  Diabetes is currently well controlled.  Continue current medications.  Kidney disease stable per nephrology.  Monitor medications.  Continue metformin.  Follow up: Return in about 2 weeks (around 04/03/2018) for recheck.. Orders Placed This Encounter  Procedures  . DG Chest 2 View  . POCT HgB A1C   Meds ordered this encounter  Medications  . albuterol (PROVENTIL) (2.5 MG/3ML) 0.083% nebulizer solution 2.5 mg  . moxifloxacin (AVELOX) 400 MG tablet    Sig: Take 1 tablet (400 mg total) by mouth daily at 8 pm.    Dispense:  7 tablet   Refill:  0  . guaiFENesin-codeine 100-10 MG/5ML syrup    Sig: Take 5 mLs by mouth every 6 (six) hours as needed for cough.    Dispense:  120 mL    Refill:  0  . predniSONE (DELTASONE) 10 MG tablet    Sig: Take 4 tabs qd x 2 days, 3 qd x 2 days, 2 qd x 2d, 1qd x 3 days    Dispense:  21 tablet    Refill:  0      Immunization History  Administered Date(s) Administered  . Influenza Split 05/24/2013  . Influenza,inj,Quad PF,6+ Mos 04/12/2011  . Influenza-Unspecified 02/07/2018  . Pneumococcal Conjugate-13 09/23/2014  . Pneumococcal Polysaccharide-23 05/24/2013  . Tdap 09/23/2014    Diabetes Related Lab Review: Lab Results  Component Value Date   HGBA1C 6.8 (A) 03/20/2018   HGBA1C 6.6 (A) 12/20/2017   HGBA1C 7.2cla 09/06/2017    No results found for: [facility] Lab Results  Component Value Date   CREATININE 1.2 (A) 08/10/2017   BUN 25 (A) 08/10/2017   NA 140 08/10/2017   K 4.9 08/10/2017   CL 103 12/20/2013   CO2 22 12/20/2013   Lab Results  Component Value Date   CHOL 122 08/10/2017   CHOL 184 07/26/2011   Lab Results  Component Value Date   HDL 39 08/10/2017   HDL 43 07/26/2011   Lab Results  Component Value Date   LDLCALC 57 08/10/2017   LDLCALC 72 07/26/2011   Lab Results  Component Value Date   TRIG 283 (A) 08/10/2017   TRIG 347 (H) 07/26/2011   Lab Results  Component Value Date   CHOLHDL 4.3 07/26/2011   No results found for: LDLDIRECT The ASCVD Risk score Denman George DC Jr., et al., 2013) failed to calculate for the following reasons:   The valid total cholesterol range is 130 to 320 mg/dL I have reviewed the PMH, Fam and Soc history. Patient Active Problem List   Diagnosis Date Noted  . 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    Priority: High    Overview:  Managed by Dr. Sharl Ma, endocrine   . Combined hyperlipidemia associated with type 2 diabetes mellitus (HCC) 07/18/2013    Priority: High  . FSGS (focal segmental  glomerulosclerosis) 07/18/2013    Priority: High    Overview:  S/p living donor transplant, brother   . CKD (chronic kidney disease) 07/18/2013    Priority: High    Overview:  Managed by Dr. Eliott Nine   . 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 History of living-donor kidney transplantation 04/19/2011    Priority: High    brother   . Long-term use of immunosuppressant medication 04/19/2011    Priority: High  . 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 Proteinuria 07/18/2013    Priority: Medium  . Menopausal hot flushes 09/06/2017    Priority: Low    Controlled on Lexapro by Dr. Rana Snare   . Internal hemorrhoids 12/20/2017    Colonoscopy with hemorrhoids 09/2017 Dr. Troy Sine;    Marland Kitchen Sciatica of right side 07/18/2013    Overview:  Due to cyst causing L5-S1 nerve irritation - resolved with epidural steroid injection 09/2013, dr. Regino Schultze, [facility] 10/2013     Social History: Patient  reports that she quit smoking about 2 years ago. Her smoking use included cigarettes. She quit after 30.00 years of use. She has never used smokeless tobacco. She reports that she drinks about 1.0 standard drinks of alcohol per week. She reports that she does not use drugs.  Review of Systems: Ophthalmic: negative for eye pain, loss of vision or double vision Cardiovascular: negative for chest pain Respiratory: Positive for SOB or persistent cough Gastrointestinal: negative for abdominal pain Genitourinary: negative for dysuria or gross hematuria MSK: negative for foot lesions Neurologic: negative for weakness or gait disturbance  Objective  Vitals: BP 126/80   Pulse 97   Temp 98.5 F (36.9 C) (Oral)   Resp 16   Ht 5\' 3"  (1.6 m)   Wt 160 lb (72.6 kg)   LMP 10/24/2012   SpO2 96%   BMI 28.34 kg/m  General: Nontoxic appearing, no significant respiratory distress, speaking in full sentences psych:  Alert and oriented, normal mood and affect HEENT:   Normocephalic, atraumatic, moist mucous membranes, supple neck  Cardiovascular:  Nl S1 and S2, RRR without murmur, gallop or rub. no edema Respiratory: Bilateral coarse rhonchi heard throughout with occasional expiratory wheeze unresolved with albuterol neb in the office.  No rales. Gastrointestinal: normal BS, soft, nontender Skin:  Warm to touch, no rashes Neurologic:   Mental status is normal. normal gait Foot exam: no erythema, pallor, or cyanosis visible nl proprioception and sensation to monofilament testing bilaterally, +2 distal pulses bilaterally    Diabetic education: ongoing education regarding chronic disease management for diabetes was given today. We continue to reinforce the ABC's of diabetic management: A1c (<7 or 8 dependent upon patient), tight blood pressure control, and  cholesterol management with goal LDL < 100 minimally. We discuss diet strategies, exercise recommendations, medication options and possible side effects. At each visit, we review recommended immunizations and preventive care recommendations for diabetics and stress that good diabetic control can prevent other problems. See below for this patient's data.    Commons side effects, risks, benefits, and alternatives for medications and treatment plan prescribed today were discussed, and the patient expressed understanding of the given instructions. Patient is instructed to call or message via MyChart if he/she has any questions or concerns regarding our treatment plan. No barriers to understanding were identified. We discussed Red Flag symptoms and signs in detail. Patient expressed understanding regarding what to do in case of urgent or emergency type symptoms.   Medication list was reconciled, printed and provided to the patient in AVS. Patient instructions and summary information was reviewed with the patient as documented in the AVS. This note was prepared with assistance of Dragon voice recognition software.  Occasional wrong-word or sound-a-like substitutions may have occurred due to the inherent limitations of voice recognition software

## 2018-03-21 ENCOUNTER — Encounter: Payer: Self-pay | Admitting: Family Medicine

## 2018-03-21 ENCOUNTER — Ambulatory Visit (INDEPENDENT_AMBULATORY_CARE_PROVIDER_SITE_OTHER): Payer: BC Managed Care – PPO

## 2018-03-21 DIAGNOSIS — J9801 Acute bronchospasm: Secondary | ICD-10-CM | POA: Diagnosis not present

## 2018-05-26 LAB — CBC AND DIFFERENTIAL
HEMATOCRIT: 41 (ref 36–46)
HEMOGLOBIN: 13.7 (ref 12.0–16.0)
NEUTROS ABS: 6
PLATELETS: 214 (ref 150–399)
WBC: 8.4

## 2018-05-26 LAB — HEPATIC FUNCTION PANEL
ALK PHOS: 74 (ref 25–125)
ALT: 14 (ref 7–35)
AST: 9 — AB (ref 13–35)
Bilirubin, Total: 0.3

## 2018-05-26 LAB — BASIC METABOLIC PANEL
BUN: 22 — AB (ref 4–21)
CREATININE: 1.1 (ref 0.5–1.1)
GLUCOSE: 139
Potassium: 4.9 (ref 3.4–5.3)
Sodium: 137 (ref 137–147)

## 2018-05-26 LAB — HEMOGLOBIN A1C: HEMOGLOBIN A1C: 6.9

## 2018-05-31 ENCOUNTER — Encounter: Payer: Self-pay | Admitting: *Deleted

## 2018-06-19 ENCOUNTER — Ambulatory Visit: Payer: BC Managed Care – PPO | Admitting: Family Medicine

## 2018-06-19 DIAGNOSIS — Z0289 Encounter for other administrative examinations: Secondary | ICD-10-CM

## 2019-01-19 ENCOUNTER — Ambulatory Visit (INDEPENDENT_AMBULATORY_CARE_PROVIDER_SITE_OTHER): Payer: BC Managed Care – PPO | Admitting: Family Medicine

## 2019-01-19 ENCOUNTER — Encounter: Payer: Self-pay | Admitting: Family Medicine

## 2019-01-19 ENCOUNTER — Other Ambulatory Visit: Payer: Self-pay

## 2019-01-19 VITALS — BP 126/84 | HR 60 | Temp 98.1°F | Ht 63.0 in | Wt 163.0 lb

## 2019-01-19 DIAGNOSIS — E139 Other specified diabetes mellitus without complications: Secondary | ICD-10-CM | POA: Diagnosis not present

## 2019-01-19 DIAGNOSIS — Z7960 Long term (current) use of unspecified immunomodulators and immunosuppressants: Secondary | ICD-10-CM

## 2019-01-19 DIAGNOSIS — Z23 Encounter for immunization: Secondary | ICD-10-CM | POA: Diagnosis not present

## 2019-01-19 DIAGNOSIS — Z940 Kidney transplant status: Secondary | ICD-10-CM

## 2019-01-19 DIAGNOSIS — R801 Persistent proteinuria, unspecified: Secondary | ICD-10-CM

## 2019-01-19 DIAGNOSIS — E1169 Type 2 diabetes mellitus with other specified complication: Secondary | ICD-10-CM | POA: Diagnosis not present

## 2019-01-19 DIAGNOSIS — Z0000 Encounter for general adult medical examination without abnormal findings: Secondary | ICD-10-CM

## 2019-01-19 DIAGNOSIS — N183 Chronic kidney disease, stage 3 unspecified: Secondary | ICD-10-CM

## 2019-01-19 DIAGNOSIS — I151 Hypertension secondary to other renal disorders: Secondary | ICD-10-CM

## 2019-01-19 DIAGNOSIS — Z79899 Other long term (current) drug therapy: Secondary | ICD-10-CM

## 2019-01-19 DIAGNOSIS — E782 Mixed hyperlipidemia: Secondary | ICD-10-CM

## 2019-01-19 DIAGNOSIS — Z1239 Encounter for other screening for malignant neoplasm of breast: Secondary | ICD-10-CM

## 2019-01-19 LAB — CBC WITH DIFFERENTIAL/PLATELET
Basophils Absolute: 0 10*3/uL (ref 0.0–0.1)
Basophils Relative: 0.5 % (ref 0.0–3.0)
Eosinophils Absolute: 0.2 10*3/uL (ref 0.0–0.7)
Eosinophils Relative: 1.8 % (ref 0.0–5.0)
HCT: 44 % (ref 36.0–46.0)
Hemoglobin: 14.3 g/dL (ref 12.0–15.0)
Lymphocytes Relative: 9.5 % — ABNORMAL LOW (ref 12.0–46.0)
Lymphs Abs: 0.9 10*3/uL (ref 0.7–4.0)
MCHC: 32.6 g/dL (ref 30.0–36.0)
MCV: 89.8 fl (ref 78.0–100.0)
Monocytes Absolute: 0.5 10*3/uL (ref 0.1–1.0)
Monocytes Relative: 5.6 % (ref 3.0–12.0)
Neutro Abs: 8 10*3/uL — ABNORMAL HIGH (ref 1.4–7.7)
Neutrophils Relative %: 82.6 % — ABNORMAL HIGH (ref 43.0–77.0)
Platelets: 223 10*3/uL (ref 150.0–400.0)
RBC: 4.89 Mil/uL (ref 3.87–5.11)
RDW: 14.8 % (ref 11.5–15.5)
WBC: 9.7 10*3/uL (ref 4.0–10.5)

## 2019-01-19 LAB — COMPREHENSIVE METABOLIC PANEL
ALT: 16 U/L (ref 0–35)
AST: 11 U/L (ref 0–37)
Albumin: 4.3 g/dL (ref 3.5–5.2)
Alkaline Phosphatase: 73 U/L (ref 39–117)
BUN: 23 mg/dL (ref 6–23)
CO2: 22 mEq/L (ref 19–32)
Calcium: 9.4 mg/dL (ref 8.4–10.5)
Chloride: 107 mEq/L (ref 96–112)
Creatinine, Ser: 1.17 mg/dL (ref 0.40–1.20)
GFR: 48.68 mL/min — ABNORMAL LOW (ref >60.00)
Glucose, Bld: 93 mg/dL (ref 70–99)
Potassium: 4.3 mEq/L (ref 3.5–5.1)
Sodium: 140 mEq/L (ref 135–145)
Total Bilirubin: 0.6 mg/dL (ref 0.2–1.2)
Total Protein: 6.4 g/dL (ref 6.0–8.3)

## 2019-01-19 LAB — LIPID PANEL
Cholesterol: 107 mg/dL (ref 0–200)
HDL: 37.4 mg/dL — ABNORMAL LOW (ref >39.00)
NonHDL: 69.92
Total CHOL/HDL Ratio: 3
Triglycerides: 230 mg/dL — ABNORMAL HIGH (ref 0.0–149.0)
VLDL: 46 mg/dL — ABNORMAL HIGH (ref 0.0–40.0)

## 2019-01-19 LAB — TSH: TSH: 0.59 u[IU]/mL (ref 0.35–4.50)

## 2019-01-19 LAB — HEMOGLOBIN A1C: Hgb A1c MFr Bld: 7.3 % — ABNORMAL HIGH (ref 4.6–6.5)

## 2019-01-19 LAB — LDL CHOLESTEROL, DIRECT: Direct LDL: 43 mg/dL

## 2019-01-19 MED ORDER — ZOLPIDEM TARTRATE 10 MG PO TABS: 10.0000 mg | ORAL_TABLET | Freq: Every evening | ORAL | 2 refills | Status: DC | PRN

## 2019-01-19 NOTE — Progress Notes (Signed)
Subjective  Chief Complaint  Patient presents with   Annual Exam   Hemorrhoids    Would like to ask about removal    HPI: Isabella Obrien is a 53 y.o. female who presents to [facility] [place] [facility] today for a Female Wellness Visit. She also has the concerns and/or needs as listed above in the chief complaint. These will be addressed in addition to the Health Maintenance Visit.   Wellness Visit: annual visit with health maintenance review and exam without Pap   HM: due for mammogram and cpe w/o pap; feeling well. Due flu shot Chronic disease f/u and/or acute problem visit: (deemed necessary to be done in addition to the wellness visit):  Multiple medical problems. Follows with renal and transplant. No longer seeing endocrine. Diabetic; has been well controlled. Htn.  She reports she is doing well.   C/o hemorrhoids external occ bleeding.   Assessment  1. Annual physical exam   2. Post-transplant diabetes mellitus (HCC)   3. Combined hyperlipidemia associated with type 2 diabetes mellitus (HCC)   4. Stage 3 chronic kidney disease (HCC)   5. 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 Long-term use of immunosuppressant medication   7. Persistent proteinuria   8. Breast cancer screening   9. Need for immunization against influenza      Plan  Female Wellness Visit:  Age appropriate Health Maintenance and Prevention measures were discussed with patient. Included topics are cancer screening recommendations, ways to keep healthy (see AVS) including dietary and exercise recommendations, regular eye and dental care, use of seat belts, and avoidance of moderate alcohol use and tobacco use. Ordered mammogram  BMI: discussed patient's BMI and encouraged positive lifestyle modifications to help get to or maintain a target BMI.  HM needs and immunizations were addressed and ordered. See below for orders. See HM and immunization section for updates. Flu shot today.    Routine labs and screening tests ordered including cmp, cbc and lipids where appropriate.  Discussed recommendations regarding Vit D and calcium supplementation (see AVS)  Chronic disease management visit and/or acute problem visit:  Diabetes: check a1c and go from there. Discussed need for more frequent f/u; minimum ofq 6 months.   HTN is controlled. Check labs.  HDL on statin.  ckd perrenal.   Immunosuppressed.  ambien refilled for prn use for insomnia Rectal HPV warts - I favor Hpv over hemorrhoids. Reassured. F/u if worsen.  Follow up: Return in about 6 months (around 07/22/2019) for follow up of diabetes and hypertension.  Orders Placed This Encounter  Procedures   MM DIGITAL SCREENING BILATERAL   Flu Vaccine QUAD 36+ mos IM   CBC with Differential/Platelet   Comprehensive metabolic panel   Hemoglobin A1c   Lipid panel   TSH   PTH, intact (no Ca)   Iron, TIBC and Ferritin Panel   Meds ordered this encounter  Medications   zolpidem (AMBIEN) 10 MG tablet    Sig: Take 1 tablet (10 mg total) by mouth at bedtime as needed for sleep.    Dispense:  30 tablet    Refill:  2      Lifestyle: Body mass index is 28.87 kg/m. Wt Readings from Last 3 Encounters:  01/19/19 163 lb (73.9 kg)  03/20/18 160 lb (72.6 kg)  12/20/17 159 lb 3.2 oz (72.2 kg)    Patient Active Problem List   Diagnosis Date Noted   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    Priority:  High    Overview:  Managed by Dr. 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 Corinna Capra    Internal hemorrhoids 12/20/2017    Colonoscopy with hemorrhoids 09/2017 Dr. Allyn Kenner;     Sciatica of right side 07/18/2013    Overview:  Due to cyst causing L5-S1 nerve irritation - resolved with epidural steroid injection 09/2013, dr. Mina Marble, [facility] 10/2013    Health Maintenance  Topic Date Due   HEMOGLOBIN A1C  11/24/2018   FOOT EXAM  12/21/2018   INFLUENZA VACCINE  12/23/2018   MAMMOGRAM  01/23/2019   PAP SMEAR-Modifier  12/23/2019   OPHTHALMOLOGY EXAM  01/15/2020   TETANUS/TDAP  09/22/2024   COLONOSCOPY  10/04/2027   PNEUMOCOCCAL POLYSACCHARIDE VACCINE AGE 59-64 HIGH RISK  Completed   HIV Screening  Completed   Immunization History  Administered Date(s) Administered   Influenza Split 05/24/2013   Influenza,inj,Quad PF,6+ Mos 04/12/2011, 01/19/2019   Influenza-Unspecified 02/07/2018   Pneumococcal Conjugate-13 09/23/2014   Pneumococcal Polysaccharide-23 05/24/2013   Tdap 09/23/2014   We updated and reviewed the patient's past history in detail and it is documented below. Allergies: Patient is allergic to cefuroxime; benadryl [diphenhydramine hcl]; ceftin; and tape. Past Medical History Patient  has a past medical history of Chronic kidney disease, Diabetes mellitus without complication ([place]), History of blood product transfusion, Hyperlipidemia, Hypertension, Menopausal hot flushes (09/06/2017), and Neuromuscular disorder ([place]). Past Surgical History Patient  has a past surgical history that includes Kidney transplant; Colonoscopy; diateck catheter placement (09/2013); and ORIF ankle fracture (Left, 2014). Family  History: Patient family history includes Healthy in her brother; Hypertension in her mother; Other (age of onset: 41) in her father. Social History:  Patient  reports that she quit smoking about 3 years ago. Her smoking use included cigarettes. She quit after 30.00 years of use. She has never used smokeless tobacco. She reports current alcohol use of about 1.0 standard drinks of alcohol per week. She reports that she does not use drugs.  Review of Systems: Constitutional: negative for fever or malaise Ophthalmic: negative for photophobia, double vision or loss of vision Cardiovascular: negative for chest pain, dyspnea on exertion, or new LE swelling Respiratory: negative for SOB or persistent cough Gastrointestinal: negative for abdominal pain, change in bowel habits or melena Genitourinary: negative for dysuria or gross hematuria, no abnormal uterine bleeding or disharge Musculoskeletal: negative for new gait disturbance or muscular weakness Integumentary: negative for new or persistent rashes, no breast lumps Neurological: negative for TIA or stroke symptoms Psychiatric: negative for SI or delusions Allergic/Immunologic: negative for hives  Patient Care Team    Relationship Specialty Notifications Start End  Leamon Arnt, MD PCP - General  Family Medicine  09/06/17   Talmage CoinKerr, Jeffrey, MD Consulting Physician Endocrinology  09/06/17   Rebekah Sprinkle Balunham, Cynthia, MD Consulting Physician Nephrology  09/06/17   Francoise Schaumannogers, Jeffrey, MD Referring Physician Surgery  09/06/17   Sharrell KuMedoff, Jeffrey, MD Consulting Physician Gastroenterology  09/06/17   Candice CampLowe, David, MD Consulting Physician Obstetrics and Gynecology  09/06/17     Objective  Vitals: BP 126/84 (BP Location: Left Arm, Patient Position: Sitting, Cuff Size: Normal)    Pulse 60    Temp 98.1 F (36.7 C)    Ht 5\' 3"  (1.6 m)    Wt 163 lb (73.9 kg)    LMP 10/24/2012    SpO2 96%    BMI 28.87 kg/m  General:  Well developed, well nourished, no acute distress    Psych:  Alert and orientedx3,normal mood and affect HEENT:  Normocephalic, atraumatic, non-icteric sclera, PERRL, oropharynx is clear without mass or exudate, supple neck without adenopathy, mass or thyromegaly Cardiovascular:  Normal S1, S2, RRR without gallop, rub or murmur, nondisplaced PMI Respiratory:  Good breath sounds bilaterally, CTAB with normal respiratory effort Gastrointestinal: normal bowel sounds, soft, non-tender, no noted masses. No HSM MSK: no deformities, contusions. Joints are without erythema or swelling. Spine and CVA region are nontender Skin:  Warm, no rashes or suspicious lesions noted Neurologic:    Mental status is normal. CN 2-11 are normal. Gross motor and sensory exams are normal. Normal gait. No tremor Breast Exam: No mass, skin retraction or nipple discharge is appreciated in either breast. No axillary adenopathy. Fibrocystic changes are not noted rectal Exam: external hpv growths x 2   Commons side effects, risks, benefits, and alternatives for medications and treatment plan prescribed today were discussed, and the patient expressed understanding of the given instructions. Patient is instructed to call or message via MyChart if he/she has any questions or concerns regarding our treatment plan. No barriers to understanding were identified. We discussed Red Flag symptoms and signs in detail. Patient expressed understanding regarding what to do in case of urgent or emergency type symptoms.   Medication list was reconciled, printed and provided to the patient in AVS. Patient instructions and summary information was reviewed with the patient as documented in the AVS. This note was prepared with assistance of Dragon voice recognition software. Occasional wrong-word or sound-a-like substitutions may have occurred due to the inherent limitations of voice recognition software

## 2019-01-19 NOTE — Patient Instructions (Addendum)
Please return in 6 months for diabetes and hypertension follow up.   I will release your lab results to you on your MyChart account with further instructions. Please reply with any questions.   Today you were given your flu vaccination.   If you have any questions or concerns, please don't hesitate to send me a message via MyChart or call the office at [phone]. Thank you for visiting with [place] today! It's our pleasure caring for you.   Preventive Care 19-38 Years Old, Female Preventive care refers to visits with your health care provider and lifestyle choices that can promote health and wellness. This includes:  A yearly physical exam. This may also be called an annual well check.  Regular dental visits and eye exams.  Immunizations.  Screening for certain conditions.  Healthy lifestyle choices, such as eating a healthy diet, getting regular exercise, not using drugs or products that contain nicotine and tobacco, and limiting alcohol use. What can I expect for my preventive care visit? Physical exam Your health care provider will check your:  Height and weight. This may be used to calculate body mass index (BMI), which tells if you are at a healthy weight.  Heart rate and blood pressure.  Skin for abnormal spots. Counseling Your health care provider may ask you questions about your:  Alcohol, tobacco, and drug use.  Emotional well-being.  Home and relationship well-being.  Sexual activity.  Eating habits.  Work and work Statistician.  Method of birth control.  Menstrual cycle.  Pregnancy history. What immunizations do I need?  Influenza (flu) vaccine  This is recommended every year. Tetanus, diphtheria, and pertussis (Tdap) vaccine  You may need a Td booster every 10 years. Varicella (chickenpox) vaccine  You may need this if you have not been vaccinated. Zoster (shingles) vaccine  You may need this after age 45. Measles, mumps, and rubella (MMR)  vaccine  You may need at least one dose of MMR if you were born in 1957 or later. You may also need a second dose. Pneumococcal conjugate (PCV13) vaccine  You may need this if you have certain conditions and were not previously vaccinated. Pneumococcal polysaccharide (PPSV23) vaccine  You may need one or two doses if you smoke cigarettes or if you have certain conditions. Meningococcal conjugate (MenACWY) vaccine  You may need this if you have certain conditions. Hepatitis A vaccine  You may need this if you have certain conditions or if you travel or work in places where you may be exposed to hepatitis A. Hepatitis B vaccine  You may need this if you have certain conditions or if you travel or work in places where you may be exposed to hepatitis B. Haemophilus influenzae type b (Hib) vaccine  You may need this if you have certain conditions. Human papillomavirus (HPV) vaccine  If recommended by your health care provider, you may need three doses over 6 months. You may receive vaccines as individual doses or as more than one vaccine together in one shot (combination vaccines). Talk with your health care provider about the risks and benefits of combination vaccines. What tests do I need? Blood tests  Lipid and cholesterol levels. These may be checked every 5 years, or more frequently if you are over 1 years old.  Hepatitis C test.  Hepatitis B test. Screening  Lung cancer screening. You may have this screening every year starting at age 24 if you have a 30-pack-year history of smoking and currently smoke or have quit  within the past 15 years.  Colorectal cancer screening. All adults should have this screening starting at age 103 and continuing until age 9. Your health care provider may recommend screening at age 28 if you are at increased risk. You will have tests every 1-10 years, depending on your results and the type of screening test.  Diabetes screening. This is done by  checking your blood sugar (glucose) after you have not eaten for a while (fasting). You may have this done every 1-3 years.  Mammogram. This may be done every 1-2 years. Talk with your health care provider about when you should start having regular mammograms. This may depend on whether you have a family history of breast cancer.  BRCA-related cancer screening. This may be done if you have a family history of breast, ovarian, tubal, or peritoneal cancers.  Pelvic exam and Pap test. This may be done every 3 years starting at age 50. Starting at age 44, this may be done every 5 years if you have a Pap test in combination with an HPV test. Other tests  Sexually transmitted disease (STD) testing.  Bone density scan. This is done to screen for osteoporosis. You may have this scan if you are at high risk for osteoporosis. Follow these instructions at home: Eating and drinking  Eat a diet that includes fresh fruits and vegetables, whole grains, lean protein, and low-fat dairy.  Take vitamin and mineral supplements as recommended by your health care provider.  Do not drink alcohol if: ? Your health care provider tells you not to drink. ? You are pregnant, may be pregnant, or are planning to become pregnant.  If you drink alcohol: ? Limit how much you have to 0-1 drink a day. ? Be aware of how much alcohol is in your drink. In the [place] one drink equals one 12 oz bottle of beer (355 mL), one 5 oz glass of wine (148 mL), or one 1 oz glass of hard liquor (44 mL). Lifestyle  Take daily care of your teeth and gums.  Stay active. Exercise for at least 30 minutes on 5 or more days each week.  Do not use any products that contain nicotine or tobacco, such as cigarettes, e-cigarettes, and chewing tobacco. If you need help quitting, ask your health care provider.  If you are sexually active, practice safe sex. Use a condom or other form of birth control (contraception) in order to prevent pregnancy  and STIs (sexually transmitted infections).  If told by your health care provider, take low-dose aspirin daily starting at age 15. What's next?  Visit your health care provider once a year for a well check visit.  Ask your health care provider how often you should have your eyes and teeth checked.  Stay up to date on all vaccines. This information is not intended to replace advice given to you by your health care provider. Make sure you discuss any questions you have with your health care provider. Document Released: 06/06/2015 Document Revised: 01/19/2018 Document Reviewed: 01/19/2018 Elsevier Patient Education  2020 [employer].

## 2019-01-30 LAB — IRON,TIBC AND FERRITIN PANEL
%SAT: 23 % (calc) (ref 16–45)
Ferritin: 28 ng/mL (ref 16–232)
Iron: 89 ug/dL (ref 45–160)
TIBC: 385 mcg/dL (calc) (ref 250–450)

## 2019-01-30 LAB — PARATHYROID HORMONE, INTACT (NO CA): PTH: 52 pg/mL (ref 14–64)

## 2019-01-30 LAB — TIQ-MISC

## 2019-01-30 LAB — EXTRA SPECIMEN

## 2019-04-23 ENCOUNTER — Other Ambulatory Visit: Payer: Self-pay | Admitting: Family Medicine

## 2019-04-23 NOTE — Telephone Encounter (Signed)
See note

## 2019-04-23 NOTE — Telephone Encounter (Signed)
You have not filled for patient in the past. Last visit with you was on 01/19/2019.

## 2019-04-23 NOTE — Telephone Encounter (Signed)
Medication Refill - Medication: escitalopram (LEXAPRO) 20 MG tablet [[id number]]   Has the patient contacted their pharmacy? No. (Agent: If no, request that the patient contact the pharmacy for the refill.) (Agent: If yes, when and what did the pharmacy advise?)  Preferred Pharmacy (with phone number or street name):     [id number] - [place], [address] AT [address] [phone] (Phone)     Agent: Please be advised that RX refills may take up to 3 business days. We ask that you follow-up with your pharmacy.

## 2019-04-24 MED ORDER — ESCITALOPRAM OXALATE 20 MG PO TABS: 20.0000 mg | ORAL_TABLET | Freq: Every day | ORAL | 3 refills | Status: DC

## 2019-07-23 ENCOUNTER — Ambulatory Visit: Payer: BC Managed Care – PPO | Admitting: Family Medicine

## 2019-07-25 ENCOUNTER — Other Ambulatory Visit: Payer: Self-pay

## 2019-07-25 ENCOUNTER — Ambulatory Visit (INDEPENDENT_AMBULATORY_CARE_PROVIDER_SITE_OTHER): Payer: BC Managed Care – PPO | Admitting: Family Medicine

## 2019-07-25 ENCOUNTER — Encounter: Payer: Self-pay | Admitting: Family Medicine

## 2019-07-25 VITALS — BP 126/82 | HR 81 | Temp 97.9°F | Ht 63.0 in | Wt 165.4 lb

## 2019-07-25 DIAGNOSIS — I151 Hypertension secondary to other renal disorders: Secondary | ICD-10-CM

## 2019-07-25 DIAGNOSIS — Z7960 Long term (current) use of unspecified immunomodulators and immunosuppressants: Secondary | ICD-10-CM

## 2019-07-25 DIAGNOSIS — E782 Mixed hyperlipidemia: Secondary | ICD-10-CM

## 2019-07-25 DIAGNOSIS — E139 Other specified diabetes mellitus without complications: Secondary | ICD-10-CM

## 2019-07-25 DIAGNOSIS — E1169 Type 2 diabetes mellitus with other specified complication: Secondary | ICD-10-CM

## 2019-07-25 DIAGNOSIS — Z79899 Other long term (current) drug therapy: Secondary | ICD-10-CM

## 2019-07-25 DIAGNOSIS — N183 Chronic kidney disease, stage 3 unspecified: Secondary | ICD-10-CM

## 2019-07-25 DIAGNOSIS — F5101 Primary insomnia: Secondary | ICD-10-CM

## 2019-07-25 DIAGNOSIS — Z940 Kidney transplant status: Secondary | ICD-10-CM | POA: Diagnosis not present

## 2019-07-25 LAB — POCT GLYCOSYLATED HEMOGLOBIN (HGB A1C): Hemoglobin A1C: 6.9 % — AB (ref 4.0–5.6)

## 2019-07-25 MED ORDER — JANUVIA 100 MG PO TABS: 100.0000 mg | ORAL_TABLET | Freq: Every day | ORAL | 3 refills | Status: DC

## 2019-07-25 MED ORDER — INSULIN PEN NEEDLE 32G X 4 MM MISC: 50.0000 | Freq: Two times a day (BID) | 3 refills | Status: DC

## 2019-07-25 MED ORDER — METFORMIN HCL 1000 MG PO TABS: 1000.0000 mg | ORAL_TABLET | Freq: Two times a day (BID) | ORAL | 3 refills | Status: DC

## 2019-07-25 MED ORDER — ATORVASTATIN CALCIUM 40 MG PO TABS: 40.0000 mg | ORAL_TABLET | Freq: Every day | ORAL | 3 refills | Status: DC

## 2019-07-25 MED ORDER — BASAGLAR KWIKPEN 100 UNIT/ML ~~LOC~~ SOPN: 10.0000 [IU] | PEN_INJECTOR | Freq: Every day | SUBCUTANEOUS | 3 refills | Status: DC

## 2019-07-25 MED ORDER — ZOLPIDEM TARTRATE 10 MG PO TABS: 10.0000 mg | ORAL_TABLET | Freq: Every evening | ORAL | 2 refills | Status: DC | PRN

## 2019-07-25 NOTE — Patient Instructions (Signed)
Please return in 6 months for your annual complete physical; please come fasting.  We are starting nighttime low dose insulin. Keep an eye on your sugars and let me know if you experience any lows.   If you have any questions or concerns, please don't hesitate to send me a message via MyChart or call the office at [phone]. Thank you for visiting with Isabella Obrien today! It's our pleasure caring for you.

## 2019-07-25 NOTE — Progress Notes (Signed)
Subjective  CC:  Chief Complaint  Patient presents with  . Diabetes    does not check readings at home. lost machine  . Hypertension    No HA, dizziness, or visual changes  . Hyperlipidemia    Takes atorvastatin   Reviewed notes from transplant team and renal.  HPI: Isabella Obrien is a 54 y.o. female who presents to the office today for follow up of diabetes and problems listed above in the chief complaint.   Diabetes follow up: Her diabetic control is reported as Improved. Is eating better "but it is hard". On januvia and metformin. CKD with worsening proteinuria, most recent gfr48%.  She denies exertional CP or SOB or symptomatic hypoglycemia. She denies foot sores or paresthesias. Has eye exam scheduled.   HTN is controlled.   HLD on statin controlled.   ckd w/ worsening proteinuria s/p renal bx yesterday awaiting results. Some leg edema. No nausea. Lytes ok.   Insomnia on chronic ambien. Working well.   Wt Readings from Last 3 Encounters:  07/25/19 165 lb 6.4 oz (75 kg)  01/19/19 163 lb (73.9 kg)  03/20/18 160 lb (72.6 kg)    BP Readings from Last 3 Encounters:  07/25/19 126/82  01/19/19 126/84  03/20/18 126/80    Assessment  1. Post-transplant diabetes mellitus (HCC)   2. 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 Long-term use of immunosuppressant medication   4. History of living-donor kidney transplantation   5. Combined hyperlipidemia associated with type 2 diabetes mellitus (HCC)   6. Stage 3 chronic kidney disease, unspecified whether stage 3a or 3b CKD   7. Primary insomnia      Plan   Diabetes is currently marginally controlled. Would like < 7 consistently. Add low dose long acting insulin. Education given. Recheck 3 months.   HTN and HLD are controlle.d   Awaiting bx results from renal. Renal function is stable but worrisome for recurrent disease.   ambien refilled.   Mood is good.   Follow up: 6 months for cpe. Orders Placed This  Encounter  Procedures  . POCT glycosylated hemoglobin (Hb A1C)   Meds ordered this encounter  Medications  . JANUVIA 100 MG tablet    Sig: Take 1 tablet (100 mg total) by mouth daily.    Dispense:  90 tablet    Refill:  3  . metFORMIN (GLUCOPHAGE) 1000 MG tablet    Sig: Take 1 tablet (1,000 mg total) by mouth 2 (two) times daily with a meal.    Dispense:  90 tablet    Refill:  3  . atorvastatin (LIPITOR) 40 MG tablet    Sig: Take 1 tablet (40 mg total) by mouth daily.    Dispense:  90 tablet    Refill:  3  . zolpidem (AMBIEN) 10 MG tablet    Sig: Take 1 tablet (10 mg total) by mouth at bedtime as needed for sleep.    Dispense:  30 tablet    Refill:  2  . Insulin Glargine (BASAGLAR KWIKPEN) 100 UNIT/ML SOPN    Sig: Inject 0.1 mLs (10 Units total) into the skin daily.    Dispense:  3 pen    Refill:  3  . Insulin Pen Needle 32G X 4 MM MISC    Sig: 50 each by Does not apply route in the morning and at bedtime.    Dispense:  50 each    Refill:  3      Immunization History  Administered Date(s) Administered  . Influenza Split 05/24/2013  . Influenza,inj,Quad PF,6+ Mos 04/12/2011, 01/19/2019  . Influenza-Unspecified 02/07/2018  . PFIZER SARS-COV-2 Vaccination 07/20/2019  . Pneumococcal Conjugate-13 09/23/2014  . Pneumococcal Polysaccharide-23 05/24/2013  . Tdap 09/23/2014    Diabetes Related Lab Review: Lab Results  Component Value Date   HGBA1C 6.9 (A) 07/25/2019   HGBA1C 7.3 (H) 01/19/2019   HGBA1C 6.9 05/26/2018   a1c 7.1 06/2019 from renal, care everywhere  No results found for: Derl Barrow Lab Results  Component Value Date   CREATININE 1.17 01/19/2019   BUN 23 01/19/2019   NA 140 01/19/2019   K 4.3 01/19/2019   CL 107 01/19/2019   CO2 22 01/19/2019   Lab Results  Component Value Date   CHOL 107 01/19/2019   CHOL 122 08/10/2017   CHOL 184 07/26/2011   Lab Results  Component Value Date   HDL 37.40 (L) 01/19/2019   HDL 39 08/10/2017   HDL 43  07/26/2011   Lab Results  Component Value Date   LDLCALC 57 08/10/2017   LDLCALC 72 07/26/2011   Lab Results  Component Value Date   TRIG 230.0 (H) 01/19/2019   TRIG 283 (A) 08/10/2017   TRIG 347 (H) 07/26/2011   Lab Results  Component Value Date   CHOLHDL 3 01/19/2019   CHOLHDL 4.3 07/26/2011   Lab Results  Component Value Date   LDLDIRECT 43.0 01/19/2019   The ASCVD Risk score (Goff DC Jr., et al., 2013) failed to calculate for the following reasons:   The valid total cholesterol range is 130 to 320 mg/dL I have reviewed the PMH, Fam and Soc history. Patient Active Problem List   Diagnosis Date Noted  . Post-transplant diabetes mellitus ([place]) 07/30/2013    Priority: High    Formerly managed by Dr. Buddy Duty, endocrine; stopped in 2019   . Combined hyperlipidemia associated with type 2 diabetes mellitus ([place]) 07/18/2013    Priority: High  . 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    Priority: High    Overview:  S/p living donor transplant, brother   . CKD (chronic kidney disease) 07/18/2013    Priority: High    Overview:  Managed by Dr. Lorrene Reid   . 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 History of living-donor kidney transplantation 04/19/2011    Priority: High    brother   . Long-term use of immunosuppressant medication 04/19/2011    Priority: High  . 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 Proteinuria 07/18/2013    Priority: Medium  . Internal hemorrhoids 12/20/2017    Priority: Low    Colonoscopy with hemorrhoids 09/2017 Dr. Allyn Kenner;    Marland Kitchen Menopausal hot flushes 09/06/2017    Priority: Low    Controlled on Lexapro by Dr. Corinna Capra   . Sciatica of right side 07/18/2013    Overview:  Due to cyst causing L5-S1 nerve irritation - resolved with epidural steroid injection 09/2013, dr. Mina Marble, [facility] 10/2013     Social History: Patient  reports that she quit smoking about 3 years ago. Her  smoking use included cigarettes. She quit after 30.00 years of use. She has never used smokeless tobacco. She reports current alcohol use of about 1.0 standard drinks of alcohol per week. She reports that she does not use drugs.  Review of Systems: Ophthalmic: negative for eye pain, loss of vision or double vision Cardiovascular: negative for chest pain Respiratory: negative for SOB or  persistent cough Gastrointestinal: negative for abdominal pain Genitourinary: negative for dysuria or gross hematuria MSK: negative for foot lesions Neurologic: negative for weakness or gait disturbance  Objective  Vitals: BP 126/82 (BP Location: Right Arm, Patient Position: Sitting, Cuff Size: Normal)   Pulse 81   Temp 97.9 F (36.6 C) (Temporal)   Ht 5\' 3"  (1.6 m)   Wt 165 lb 6.4 oz (75 kg)   LMP 10/24/2012   SpO2 98%   BMI 29.30 kg/m  General: well appearing, no acute distress  Psych:  Alert and oriented, normal mood and affect HEENT:  Normocephalic, atraumatic, supple neck  Cardiovascular:  Nl S1 and S2, RRR without murmur, gallop or rub. no edema Respiratory:  Good breath sounds bilaterally, CTAB with normal effort, no rales Skin:  Warm, no rashes Neurologic:   Mental status is normal. normal gait Foot exam: no erythema, pallor, or cyanosis visible nl proprioception and sensation to monofilament testing bilaterally, +2 distal pulses bilaterally    Diabetic education: ongoing education regarding chronic disease management for diabetes was given today. We continue to reinforce the ABC's of diabetic management: A1c (<7 or 8 dependent upon patient), tight blood pressure control, and cholesterol management with goal LDL < 100 minimally. We discuss diet strategies, exercise recommendations, medication options and possible side effects. At each visit, we review recommended immunizations and preventive care recommendations for diabetics and stress that good diabetic control can prevent other problems. See  below for this patient's data.    Commons side effects, risks, benefits, and alternatives for medications and treatment plan prescribed today were discussed, and the patient expressed understanding of the given instructions. Patient is instructed to call or message via MyChart if he/she has any questions or concerns regarding our treatment plan. No barriers to understanding were identified. We discussed Red Flag symptoms and signs in detail. Patient expressed understanding regarding what to do in case of urgent or emergency type symptoms.   Medication list was reconciled, printed and provided to the patient in AVS. Patient instructions and summary information was reviewed with the patient as documented in the AVS. This note was prepared with assistance of Dragon voice recognition software. Occasional wrong-word or sound-a-like substitutions may have occurred due to the inherent limitations of voice recognition software  This visit occurred during the SARS-CoV-2 public health emergency.  Safety protocols were in place, including screening questions prior to the visit, additional usage of staff PPE, and extensive cleaning of exam room while observing appropriate contact time as indicated for disinfecting solutions.

## 2019-08-21 ENCOUNTER — Telehealth: Payer: Self-pay | Admitting: Family Medicine

## 2019-08-21 NOTE — Telephone Encounter (Signed)
Pt called and asked if Dr. Mardelle Matte would write a prescription for pt to get a glucometer. [facility] on [phone]. Please advise.

## 2019-08-22 ENCOUNTER — Other Ambulatory Visit: Payer: Self-pay

## 2019-08-22 MED ORDER — BLOOD GLUCOSE MONITOR KIT: PACK | 0 refills | Status: DC

## 2019-08-22 NOTE — Telephone Encounter (Signed)
Rx faxed to pharmacy  

## 2019-08-27 ENCOUNTER — Telehealth: Payer: Self-pay | Admitting: Family Medicine

## 2019-08-27 NOTE — Telephone Encounter (Signed)
Patient states she dropped off Short Term Disability Forms through the state to be completed.  Patient states forms are due this week.  States that forms can not say anything about COVID.  Patient states her diagnosis from her march visit needs to be documented on the form.  Patient states Dr. Mardelle Matte is aware of this situation.  Please follow back up with patient in regard.

## 2019-08-27 NOTE — Telephone Encounter (Signed)
Please call pt: please ask her to clarify why she is applying for short term disability: due to which diagnosis and what are her limitations.  We did not discuss this in march from my recollection and I need this information to complete forms. thanks

## 2019-08-27 NOTE — Telephone Encounter (Signed)
Do you have these forms?

## 2019-08-27 NOTE — Telephone Encounter (Signed)
LMOVM for patient to return call.

## 2019-08-28 NOTE — Telephone Encounter (Signed)
Patient calling back to speak to katie. 

## 2019-08-28 NOTE — Telephone Encounter (Signed)
Spoke with patient, aware that paperwork will be available to pick up this afternoon.

## 2019-10-04 ENCOUNTER — Other Ambulatory Visit: Payer: Self-pay | Admitting: Obstetrics and Gynecology

## 2019-10-04 DIAGNOSIS — R928 Other abnormal and inconclusive findings on diagnostic imaging of breast: Secondary | ICD-10-CM

## 2019-10-15 ENCOUNTER — Ambulatory Visit
Admission: RE | Admit: 2019-10-15 | Discharge: 2019-10-15 | Disposition: A | Discharge status: Home / Self Care | Payer: BC Managed Care – PPO | Source: Ambulatory Visit | Attending: Obstetrics and Gynecology | Admitting: Obstetrics and Gynecology

## 2019-10-15 ENCOUNTER — Other Ambulatory Visit: Payer: Self-pay

## 2019-10-15 DIAGNOSIS — R928 Other abnormal and inconclusive findings on diagnostic imaging of breast: Secondary | ICD-10-CM

## 2019-10-16 ENCOUNTER — Telehealth: Payer: Self-pay | Admitting: Family Medicine

## 2019-10-16 DIAGNOSIS — Z1152 Encounter for screening for COVID-19: Secondary | ICD-10-CM

## 2019-10-16 NOTE — Telephone Encounter (Signed)
Patient is calling in this morning asking if she could get an antibody test for covid, states that she is needing it for work.

## 2019-10-16 NOTE — Telephone Encounter (Signed)
Can schedule a lab visit and order Sars antibody. Please ensure this is what she is wanting. This test, if positive tells you you have been exposed or had disease in past or have had the vaccine. If it is negative, it is not that helpful because it an be falsely negative.

## 2019-10-16 NOTE — Addendum Note (Signed)
Addended by: Manuela Schwartz on: 10/16/2019 02:36 PM   Modules accepted: Orders

## 2019-10-18 ENCOUNTER — Other Ambulatory Visit: Payer: Self-pay

## 2019-10-18 ENCOUNTER — Other Ambulatory Visit (INDEPENDENT_AMBULATORY_CARE_PROVIDER_SITE_OTHER): Payer: BC Managed Care – PPO

## 2019-10-18 DIAGNOSIS — Z1152 Encounter for screening for COVID-19: Secondary | ICD-10-CM

## 2019-10-19 ENCOUNTER — Telehealth: Payer: Self-pay | Admitting: Family Medicine

## 2019-10-19 LAB — SARS-COV-2 ANTIBODY, IGM: SARS-CoV-2 Antibody, IgM: NEGATIVE

## 2019-10-19 NOTE — Telephone Encounter (Signed)
Please call patient: please clarify the purpose of her wanting the test.  The test was for the IgM antibody: this is an acute antibody.  We are not using the test to prove immunity after vaccination.

## 2019-10-19 NOTE — Telephone Encounter (Signed)
Called back no answer.jk

## 2019-10-19 NOTE — Telephone Encounter (Signed)
Pt called asking to speak with someone about her lab results. She is confused as to why her test said she did not have any antibodies to COVID but she got the [employer] vaccine. Please advise.

## 2019-11-21 ENCOUNTER — Telehealth: Payer: Self-pay | Admitting: Family Medicine

## 2019-11-21 NOTE — Telephone Encounter (Signed)
Placed in your folder looks like last set were filed out until 12/2019? If ok I can fill out if you just want to sign

## 2019-11-21 NOTE — Telephone Encounter (Signed)
Patient is asking if her letter is ready, also wanted to know if she can get another letter for school purposes, preferably reasonable accommodations, she is needing it to state that she is in a high risk group due to her diabetes.

## 2019-11-22 ENCOUNTER — Telehealth: Payer: Self-pay

## 2019-11-22 ENCOUNTER — Encounter: Payer: Self-pay | Admitting: Family Medicine

## 2019-11-22 NOTE — Telephone Encounter (Signed)
yes

## 2019-11-22 NOTE — Telephone Encounter (Signed)
Spoke to patient. Letter put with other forms to pick up.

## 2019-11-22 NOTE — Telephone Encounter (Signed)
Pt called asking for update of if her short term disability papers are signed. She has to turn them in next week.

## 2019-11-22 NOTE — Telephone Encounter (Signed)
Information documented in first message.

## 2019-11-22 NOTE — Telephone Encounter (Signed)
PPW filled out l/m to let her know ready for pick up. I have seen in first message that she needs letter. Just need to find out what she would like in it.

## 2019-12-28 ENCOUNTER — Telehealth: Payer: Self-pay | Admitting: Family Medicine

## 2019-12-28 ENCOUNTER — Encounter: Payer: Self-pay | Admitting: Family Medicine

## 2019-12-28 NOTE — Telephone Encounter (Signed)
Letter is placed in bin at front desk. LMOVM advising patient that letter is ready for pick up

## 2019-12-28 NOTE — Telephone Encounter (Signed)
Ok to write letter

## 2019-12-28 NOTE — Telephone Encounter (Signed)
Please advise if OK to write letter. I do not see any recent office visits

## 2019-12-28 NOTE — Telephone Encounter (Signed)
Patient is calling in requesting a letter for her work that states that the patient can return to work without any restrictions and return on Monday 12/31/19. Is needing the letter today or else she can not return.

## 2020-01-03 ENCOUNTER — Telehealth: Payer: Self-pay

## 2020-01-03 NOTE — Telephone Encounter (Signed)
Paper has been filled out and signed. At front desk

## 2020-01-03 NOTE — Telephone Encounter (Signed)
Pt is requesting for her letter for disability that she brought Monday. She needs it today in order to go back to work. Has this been seen?

## 2020-01-07 ENCOUNTER — Telehealth: Payer: Self-pay | Admitting: Family Medicine

## 2020-01-07 ENCOUNTER — Other Ambulatory Visit: Payer: Self-pay | Admitting: Family Medicine

## 2020-01-07 DIAGNOSIS — F5101 Primary insomnia: Secondary | ICD-10-CM

## 2020-01-07 NOTE — Telephone Encounter (Signed)
New letter sent to patient via mychart.

## 2020-01-07 NOTE — Telephone Encounter (Signed)
Pt called stating the work note that was written for her needs to include that she has no restrictions for work. Please advise.

## 2020-01-08 NOTE — Telephone Encounter (Signed)
LR: 07-25-2019 Qty: 30  with 2 refills  Last office visit: 07-25-2019 Upcoming appointment: 01-25-2020

## 2020-01-25 ENCOUNTER — Other Ambulatory Visit: Payer: Self-pay

## 2020-01-25 ENCOUNTER — Encounter: Payer: Self-pay | Admitting: Family Medicine

## 2020-01-25 ENCOUNTER — Ambulatory Visit (INDEPENDENT_AMBULATORY_CARE_PROVIDER_SITE_OTHER): Payer: BC Managed Care – PPO | Admitting: Family Medicine

## 2020-01-25 VITALS — BP 122/70 | HR 68 | Temp 98.4°F | Resp 18 | Ht 63.0 in | Wt 159.0 lb

## 2020-01-25 DIAGNOSIS — E1169 Type 2 diabetes mellitus with other specified complication: Secondary | ICD-10-CM

## 2020-01-25 DIAGNOSIS — N951 Menopausal and female climacteric states: Secondary | ICD-10-CM

## 2020-01-25 DIAGNOSIS — Z79899 Other long term (current) drug therapy: Secondary | ICD-10-CM

## 2020-01-25 DIAGNOSIS — Z7960 Long term (current) use of unspecified immunomodulators and immunosuppressants: Secondary | ICD-10-CM

## 2020-01-25 DIAGNOSIS — I151 Hypertension secondary to other renal disorders: Secondary | ICD-10-CM

## 2020-01-25 DIAGNOSIS — Z23 Encounter for immunization: Secondary | ICD-10-CM

## 2020-01-25 DIAGNOSIS — Z940 Kidney transplant status: Secondary | ICD-10-CM

## 2020-01-25 DIAGNOSIS — N183 Chronic kidney disease, stage 3 unspecified: Secondary | ICD-10-CM | POA: Diagnosis not present

## 2020-01-25 DIAGNOSIS — E139 Other specified diabetes mellitus without complications: Secondary | ICD-10-CM

## 2020-01-25 DIAGNOSIS — E782 Mixed hyperlipidemia: Secondary | ICD-10-CM

## 2020-01-25 DIAGNOSIS — G4700 Insomnia, unspecified: Secondary | ICD-10-CM

## 2020-01-25 DIAGNOSIS — Z0000 Encounter for general adult medical examination without abnormal findings: Secondary | ICD-10-CM

## 2020-01-25 LAB — POCT URINALYSIS DIPSTICK
Bilirubin, UA: NEGATIVE
Blood, UA: NEGATIVE
Glucose, UA: POSITIVE — AB
Ketones, UA: NEGATIVE
Leukocytes, UA: NEGATIVE
Nitrite, UA: NEGATIVE
Protein, UA: POSITIVE — AB
Spec Grav, UA: 1.025 (ref 1.010–1.025)
Urobilinogen, UA: 0.2 E.U./dL
pH, UA: 5.5 (ref 5.0–8.0)

## 2020-01-25 LAB — POCT GLYCOSYLATED HEMOGLOBIN (HGB A1C): Hemoglobin A1C: 6.3 % — AB (ref 4.0–5.6)

## 2020-01-25 MED ORDER — FUROSEMIDE 40 MG PO TABS: 40.0000 mg | ORAL_TABLET | Freq: Every day | ORAL | 3 refills | Status: DC | PRN

## 2020-01-25 MED ORDER — SITAGLIPTIN PHOS-METFORMIN HCL 50-1000 MG PO TABS: 1.0000 | ORAL_TABLET | Freq: Two times a day (BID) | ORAL | 3 refills | Status: DC

## 2020-01-25 NOTE — Patient Instructions (Addendum)
Please return in 6 months for diabetes and blood pressure recheck.   I will release your lab results to you on your MyChart account with further instructions. Please reply with any questions.  I've ordered the urine protein tests for you and will forward to Dr. Signe Colt for her review.   I have printed a prescription that combines the [place] and metformin into one pill to be taken twice a day. You can fill it once you are out of your metformin and januvia. This will decrease your pill burden a little and get rid of one copay.   If you have any questions or concerns, please don't hesitate to send me a message via MyChart or call the office at [phone]. Thank you for visiting with [place] today! It's our pleasure caring for you.

## 2020-01-25 NOTE — Progress Notes (Signed)
Subjective  Chief Complaint  Patient presents with  . Annual Exam    Fasting labs. She would like to have her urine checked for protein.   Marland Kitchen Hyperlipidemia  . Hypertension  . Diabetes    HPI: Isabella Obrien is a 54 y.o. female who presents to [facility] today for a Female Wellness Visit. She also has the concerns and/or needs as listed above in the chief complaint. These will be addressed in addition to the Health Maintenance Visit.   Wellness Visit: annual visit with health maintenance review and exam without Pap   Health maintenance: Mammogram and Pap smear up-to-date.  Colonoscopy is up-to-date.  Overall she is doing well.  Due flu shot today. Chronic disease f/u and/or acute problem visit: (deemed necessary to be done in addition to the wellness visit):  Chronic kidney disease: Need to get most recent records from renal.  They are continuing to monitor her increased proteinuria.  She would like this rechecked today.  She feels stable.  Taking Lasix to manage chronic lower extremity edema.  Diabetes follow up: Her diabetic control is reported as Improved.  She is tolerating her medications well.  No symptoms of hyperglycemia. She denies exertional CP or SOB or symptomatic hypoglycemia. She denies foot sores or paresthesias.  She is due a diabetic eye exam.  Hypertension: Reports good control.  No chest pain or shortness of breath.  Hyperlipidemia on statin.  Well-tolerated and due for recheck.  Immunization History  Administered Date(s) Administered  . Influenza Split 05/24/2013  . Influenza,inj,Quad PF,6+ Mos 04/12/2011, 01/19/2019, 01/25/2020  . Influenza-Unspecified 02/07/2018  . PFIZER SARS-COV-2 Vaccination 07/20/2019, 08/17/2019, 01/12/2020  . Pneumococcal Conjugate-13 09/23/2014  . Pneumococcal Polysaccharide-23 05/24/2013  . Tdap 09/23/2014    Diabetes Related Lab Review: Lab Results  Component Value Date   HGBA1C 6.3 (A) 01/25/2020     HGBA1C 6.9 (A) 07/25/2019   HGBA1C 7.3 (H) 01/19/2019    No results found for: Concepcion Elk Lab Results  Component Value Date   CREATININE 1.28 (H) 01/25/2020   BUN 25 01/25/2020   NA 139 01/25/2020   K 4.6 01/25/2020   CL 105 01/25/2020   CO2 24 01/25/2020   Lab Results  Component Value Date   CHOL 127 01/25/2020   CHOL 107 01/19/2019   CHOL 122 08/10/2017   Lab Results  Component Value Date   HDL 43 (L) 01/25/2020   HDL 37.40 (L) 01/19/2019   HDL 39 08/10/2017   Lab Results  Component Value Date   LDLCALC 57 01/25/2020   LDLCALC 57 08/10/2017   LDLCALC 72 07/26/2011   Lab Results  Component Value Date   TRIG 194 (H) 01/25/2020   TRIG 230.0 (H) 01/19/2019   TRIG 283 (A) 08/10/2017   Lab Results  Component Value Date   CHOLHDL 3.0 01/25/2020   CHOLHDL 3 01/19/2019   CHOLHDL 4.3 07/26/2011   Lab Results  Component Value Date   LDLDIRECT 43.0 01/19/2019   The ASCVD Risk score (Goff DC Jr., et al., 2013) failed to calculate for the following reasons:   The valid total cholesterol range is 130 to 320 mg/dL  BP Readings from Last 3 Encounters:  01/25/20 122/70  07/25/19 126/82  01/19/19 126/84   Wt Readings from Last 3 Encounters:  01/25/20 159 lb (72.1 kg)  07/25/19 165 lb 6.4 oz (75 kg)  01/19/19 163 lb (73.9 kg)    Health Maintenance  Topic Date Due  .  OPHTHALMOLOGY EXAM  01/15/2020  . FOOT EXAM  07/24/2020  . HEMOGLOBIN A1C  07/24/2020  . MAMMOGRAM  12/18/2020  . PAP SMEAR-Modifier  12/19/2022  . TETANUS/TDAP  09/22/2024  . COLONOSCOPY  10/04/2027  . INFLUENZA VACCINE  Completed  . PNEUMOCOCCAL POLYSACCHARIDE VACCINE AGE 22-64 HIGH RISK  Completed  . COVID-19 Vaccine  Completed  . Hepatitis C Screening  Completed  . HIV Screening  Completed     Assessment  1. Annual physical exam   2. Stage 3 chronic kidney disease, unspecified whether stage 3a or 3b CKD   3. Combined hyperlipidemia associated with type 2 diabetes mellitus  (HCC)   4. History of living-donor kidney transplantation   5. 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 Long-term use of immunosuppressant medication   7. Post-transplant diabetes mellitus (HCC)   8. Menopausal hot flushes      Plan  Female Wellness Visit:  Age appropriate Health Maintenance and Prevention measures were discussed with patient. Included topics are cancer screening recommendations, ways to keep healthy (see AVS) including dietary and exercise recommendations, regular eye and dental care, use of seat belts, and avoidance of moderate alcohol use and tobacco use.  Screens are up-to-date  BMI: discussed patient's BMI and encouraged positive lifestyle modifications to help get to or maintain a target BMI.  HM needs and immunizations were addressed and ordered. See below for orders. See HM and immunization section for updates.  Flu shot today  Routine labs and screening tests ordered including cmp, cbc and lipids where appropriate.  Discussed recommendations regarding Vit D and calcium supplementation (see AVS)  Chronic disease management visit and/or acute problem visit:  Diabetes is well controlled.  No changes in medications.  Chronic kidney disease status post transplant: Will obtain most recent records from nephrology.  Check urine protein and renal function.  Continue Lasix.  Post transplant per [facility]  Immunosuppressed: Has received her booster Covid vaccination  Hypertension is controlled  Recheck lipids on statin.  Has been well controlled.  Menopausal hot flashes remain controlled on Lexapro  Chronic insomnia on Ambien.   Follow up: 6 months recheck diabetes and hypertension Orders Placed This Encounter  Procedures  . CBC with Differential/Platelet  . COMPLETE METABOLIC PANEL WITH GFR  . Lipid panel  . TSH  . POCT glycosylated hemoglobin (Hb A1C)  . POCT urinalysis dipstick   No orders of the defined types were placed in this encounter.      Lifestyle: Body mass index is 28.17 kg/m. Wt Readings from Last 3 Encounters:  01/25/20 159 lb (72.1 kg)  07/25/19 165 lb 6.4 oz (75 kg)  01/19/19 163 lb (73.9 kg)    Patient Active Problem List   Diagnosis Date Noted  . 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    Priority: High    Formerly managed by Dr. Sharl Ma, endocrine; stopped in 2019   . Combined hyperlipidemia associated with type 2 diabetes mellitus (HCC) 07/18/2013    Priority: High  . 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    Priority: High    Overview:  S/p living donor transplant, brother   . CKD (chronic kidney disease) 07/18/2013    Priority: High    Overview:  Managed by Dr. Eliott Nine   . 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 History of living-donor kidney transplantation 04/19/2011    Priority: High    brother   . Long-term use of immunosuppressant medication 04/19/2011    Priority:  High  . 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 Proteinuria 07/18/2013    Priority: Medium  . Internal hemorrhoids 12/20/2017    Priority: Low    Colonoscopy with hemorrhoids 09/2017 Dr. Troy Sine;    Marland Kitchen Menopausal hot flushes 09/06/2017    Priority: Low    Controlled on Lexapro by Dr. Rana Snare   . Sciatica of right side 07/18/2013    Overview:  Due to cyst causing L5-S1 nerve irritation - resolved with epidural steroid injection 09/2013, dr. Moise Boring ortho/cla 10/2013    Health Maintenance  Topic Date Due  . INFLUENZA VACCINE  12/23/2019  . OPHTHALMOLOGY EXAM  01/15/2020  . HEMOGLOBIN A1C  01/25/2020  . FOOT EXAM  07/24/2020  . MAMMOGRAM  12/18/2020  . PAP SMEAR-Modifier  12/19/2022  . TETANUS/TDAP  09/22/2024  . COLONOSCOPY  10/04/2027  . PNEUMOCOCCAL POLYSACCHARIDE VACCINE AGE 60-64 HIGH RISK  Completed  . COVID-19 Vaccine  Completed  . Hepatitis C Screening  Completed  . HIV Screening  Completed   Immunization History  Administered  Date(s) Administered  . Influenza Split 05/24/2013  . Influenza,inj,Quad PF,6+ Mos 04/12/2011, 01/19/2019  . Influenza-Unspecified 02/07/2018  . PFIZER SARS-COV-2 Vaccination 07/20/2019, 08/17/2019, 01/12/2020  . Pneumococcal Conjugate-13 09/23/2014  . Pneumococcal Polysaccharide-23 05/24/2013  . Tdap 09/23/2014   We updated and reviewed the patient's past history in detail and it is documented below. Allergies: Patient is allergic to cefuroxime, benadryl [diphenhydramine hcl], ceftin, and tape. Past Medical History Patient  has a past medical history of Chronic kidney disease, Diabetes mellitus without complication (HCC), History of blood product transfusion, Hyperlipidemia, Hypertension, Menopausal hot flushes (09/06/2017), and Neuromuscular disorder (HCC). Past Surgical History Patient  has a past surgical history that includes Kidney transplant; Colonoscopy; diateck catheter placement (09/2013); and ORIF ankle fracture (Left, 2014). Family History: Patient family history includes Healthy in her brother; Hypertension in her mother; Other (age of onset: 78) in her father. Social History:  Patient  reports that she quit smoking about 4 years ago. Her smoking use included cigarettes. She quit after 30.00 years of use. She has never used smokeless tobacco. She reports current alcohol use of about 1.0 standard drink of alcohol per week. She reports that she does not use drugs.  Review of Systems: Constitutional: negative for fever or malaise Ophthalmic: negative for photophobia, double vision or loss of vision Cardiovascular: negative for chest pain, dyspnea on exertion, or new LE swelling Respiratory: negative for SOB or persistent cough Gastrointestinal: negative for abdominal pain, change in bowel habits or melena Genitourinary: negative for dysuria or gross hematuria, no abnormal uterine bleeding or disharge Musculoskeletal: negative for new gait disturbance or muscular  weakness Integumentary: negative for new or persistent rashes, no breast lumps Neurological: negative for TIA or stroke symptoms Psychiatric: negative for SI or delusions Allergic/Immunologic: negative for hives  Patient Care Team    Relationship Specialty Notifications Start End  Willow Ora, MD PCP - General Family Medicine  09/06/17   Talmage Coin, MD Consulting Physician Endocrinology  09/06/17   Starla Deller Bal, MD Consulting Physician Nephrology  09/06/17   Francoise Schaumann, MD Referring Physician Surgery  09/06/17   Sharrell Ku, MD Consulting Physician Gastroenterology  09/06/17   Candice Camp, MD Consulting Physician Obstetrics and Gynecology  09/06/17     Objective  Vitals: BP 122/70   Pulse 68   Temp 98.4 F (36.9 C) (Temporal)   Resp 18   Ht 5\' 3"  (1.6 m)   Wt  159 lb (72.1 kg)   LMP 10/24/2012   SpO2 96%   BMI 28.17 kg/m  General:  Well developed, well nourished, no acute distress  Psych:  Alert and orientedx3,normal mood and affect HEENT:  Normocephalic, atraumatic, non-icteric sclera,  supple neck without adenopathy, mass or thyromegaly Cardiovascular:  Normal S1, S2, RRR without gallop, rub or murmur Respiratory:  Good breath sounds bilaterally, CTAB with normal respiratory effort Gastrointestinal: normal bowel sounds, soft, non-tender, no noted masses. No HSM MSK: no deformities, contusions. Joints are without erythema or swelling.  Skin:  Warm, no rashes or suspicious lesions noted Neurologic:    Mental status is normal.  Normal gait. No tremor   Commons side effects, risks, benefits, and alternatives for medications and treatment plan prescribed today were discussed, and the patient expressed understanding of the given instructions. Patient is instructed to call or message via MyChart if he/she has any questions or concerns regarding our treatment plan. No barriers to understanding were identified. We discussed Red Flag symptoms and signs in detail. Patient  expressed understanding regarding what to do in case of urgent or emergency type symptoms.   Medication list was reconciled, printed and provided to the patient in AVS. Patient instructions and summary information was reviewed with the patient as documented in the AVS. This note was prepared with assistance of Dragon voice recognition software. Occasional wrong-word or sound-a-like substitutions may have occurred due to the inherent limitations of voice recognition software  This visit occurred during the SARS-CoV-2 public health emergency.  Safety protocols were in place, including screening questions prior to the visit, additional usage of staff PPE, and extensive cleaning of exam room while observing appropriate contact time as indicated for disinfecting solutions.

## 2020-01-26 LAB — COMPLETE METABOLIC PANEL WITH GFR
AG Ratio: 2 (calc) (ref 1.0–2.5)
ALT: 11 U/L (ref 6–29)
AST: 10 U/L (ref 10–35)
Albumin: 4.3 g/dL (ref 3.6–5.1)
Alkaline phosphatase (APISO): 80 U/L (ref 37–153)
BUN/Creatinine Ratio: 20 (calc) (ref 6–22)
BUN: 25 mg/dL (ref 7–25)
CO2: 24 mmol/L (ref 20–32)
Calcium: 9.6 mg/dL (ref 8.6–10.4)
Chloride: 105 mmol/L (ref 98–110)
Creat: 1.28 mg/dL — ABNORMAL HIGH (ref 0.50–1.05)
GFR, Est African American: 56 mL/min/{1.73_m2} — ABNORMAL LOW (ref >=60)
GFR, Est Non African American: 48 mL/min/{1.73_m2} — ABNORMAL LOW (ref >=60)
Globulin: 2.2 g/dL (calc) (ref 1.9–3.7)
Glucose, Bld: 100 mg/dL — ABNORMAL HIGH (ref 65–99)
Potassium: 4.6 mmol/L (ref 3.5–5.3)
Sodium: 139 mmol/L (ref 135–146)
Total Bilirubin: 0.5 mg/dL (ref 0.2–1.2)
Total Protein: 6.5 g/dL (ref 6.1–8.1)

## 2020-01-26 LAB — CBC WITH DIFFERENTIAL/PLATELET
Absolute Monocytes: 970 cells/uL — ABNORMAL HIGH (ref 200–950)
Basophils Absolute: 68 cells/uL (ref 0–200)
Basophils Relative: 0.7 %
Eosinophils Absolute: 281 cells/uL (ref 15–500)
Eosinophils Relative: 2.9 %
HCT: 47.3 % — ABNORMAL HIGH (ref 35.0–45.0)
Hemoglobin: 15.2 g/dL (ref 11.7–15.5)
Lymphs Abs: 1397 cells/uL (ref 850–3900)
MCH: 28.6 pg (ref 27.0–33.0)
MCHC: 32.1 g/dL (ref 32.0–36.0)
MCV: 88.9 fL (ref 80.0–100.0)
MPV: 11 fL (ref 7.5–12.5)
Monocytes Relative: 10 %
Neutro Abs: 6984 cells/uL (ref 1500–7800)
Neutrophils Relative %: 72 %
Platelets: 275 10*3/uL (ref 140–400)
RBC: 5.32 10*6/uL — ABNORMAL HIGH (ref 3.80–5.10)
RDW: 14.5 % (ref 11.0–15.0)
Total Lymphocyte: 14.4 %
WBC: 9.7 10*3/uL (ref 3.8–10.8)

## 2020-01-26 LAB — LIPID PANEL
Cholesterol: 127 mg/dL (ref <200)
HDL: 43 mg/dL — ABNORMAL LOW (ref >=50)
LDL Cholesterol (Calc): 57 mg/dL (calc)
Non-HDL Cholesterol (Calc): 84 mg/dL (calc) (ref <130)
Total CHOL/HDL Ratio: 3 (calc) (ref <5.0)
Triglycerides: 194 mg/dL — ABNORMAL HIGH (ref <150)

## 2020-01-26 LAB — PROTEIN / CREATININE RATIO, URINE
Creatinine, Urine: 110 mg/dL (ref 20–275)
Protein/Creat Ratio: 3155 mg/g creat — ABNORMAL HIGH (ref 21–161)
Protein/Creatinine Ratio: 3.155 mg/mg creat — ABNORMAL HIGH (ref 0.021–0.16)
Total Protein, Urine: 347 mg/dL — ABNORMAL HIGH (ref 5–24)

## 2020-01-26 LAB — TSH: TSH: 1.16 mIU/L

## 2020-01-31 ENCOUNTER — Telehealth: Payer: Self-pay | Admitting: Family Medicine

## 2020-01-31 NOTE — Telephone Encounter (Signed)
Patient is returning a phone call about labs

## 2020-02-01 NOTE — Telephone Encounter (Signed)
Spoke with the patient and she verbalized understanding her results. No other questions or concerns at this time.  

## 2020-02-01 NOTE — Telephone Encounter (Signed)
LVM for the patient to return my call in regards to her lab work. Office number was provided.

## 2020-02-02 DIAGNOSIS — G4700 Insomnia, unspecified: Secondary | ICD-10-CM | POA: Insufficient documentation

## 2020-02-03 ENCOUNTER — Other Ambulatory Visit: Payer: Self-pay | Admitting: Family Medicine

## 2020-02-03 DIAGNOSIS — E139 Other specified diabetes mellitus without complications: Secondary | ICD-10-CM

## 2020-02-04 ENCOUNTER — Other Ambulatory Visit: Payer: Self-pay | Admitting: Family Medicine

## 2020-02-04 DIAGNOSIS — E139 Other specified diabetes mellitus without complications: Secondary | ICD-10-CM

## 2020-02-04 NOTE — Telephone Encounter (Signed)
Patient is suppose to start taking Janumet, has not picked up medication yet.

## 2020-02-04 NOTE — Telephone Encounter (Signed)
Please call pt to clarify; is she still on metformin or was it stopped; at her last visit (recent), it was marked as not taking.  If still on it, document error and refill.  If not, refuse again. Thanks.

## 2020-03-24 ENCOUNTER — Other Ambulatory Visit: Payer: Self-pay | Admitting: Family Medicine

## 2020-05-27 IMAGING — US US BREAST*L* LIMITED INC AXILLA
1 series · 3 of 3 positions shown · non-contrast
Comparison: Previous exam(s).

CLINICAL DATA: Recall from screening mammography with
tomosynthesis, possible subtle architectural distortion involving
the UPPER LEFT breast at MIDDLE depth.

EXAM:
DIGITAL DIAGNOSTIC LEFT MAMMOGRAM WITH TOMO
ULTRASOUND LEFT BREAST

[Series 1: us breast*left* limited inc axilla · 0.07mm/px · 3 of 3 slices shown]
[im 1/3]
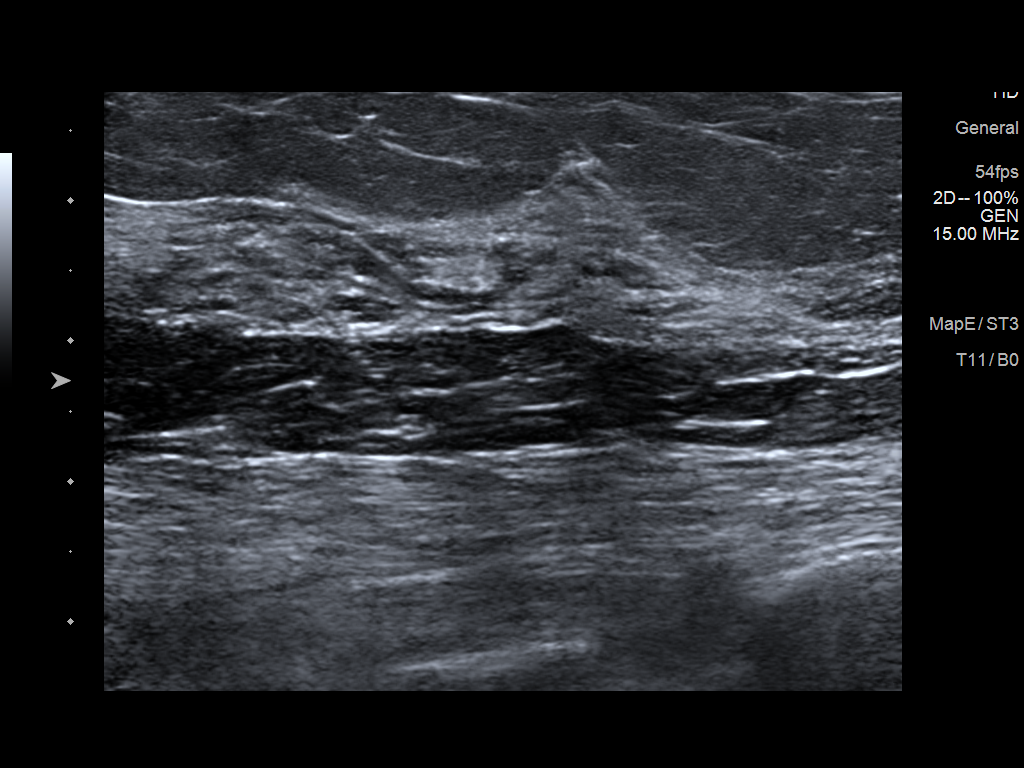
[im 2/3]
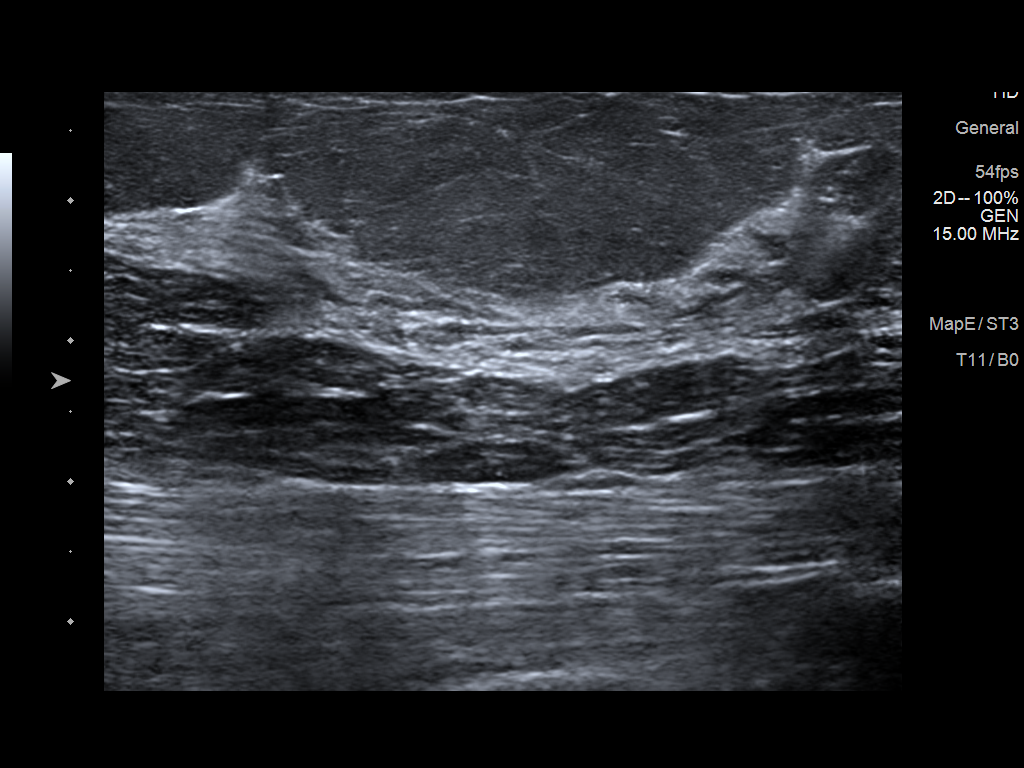
[im 3/3]
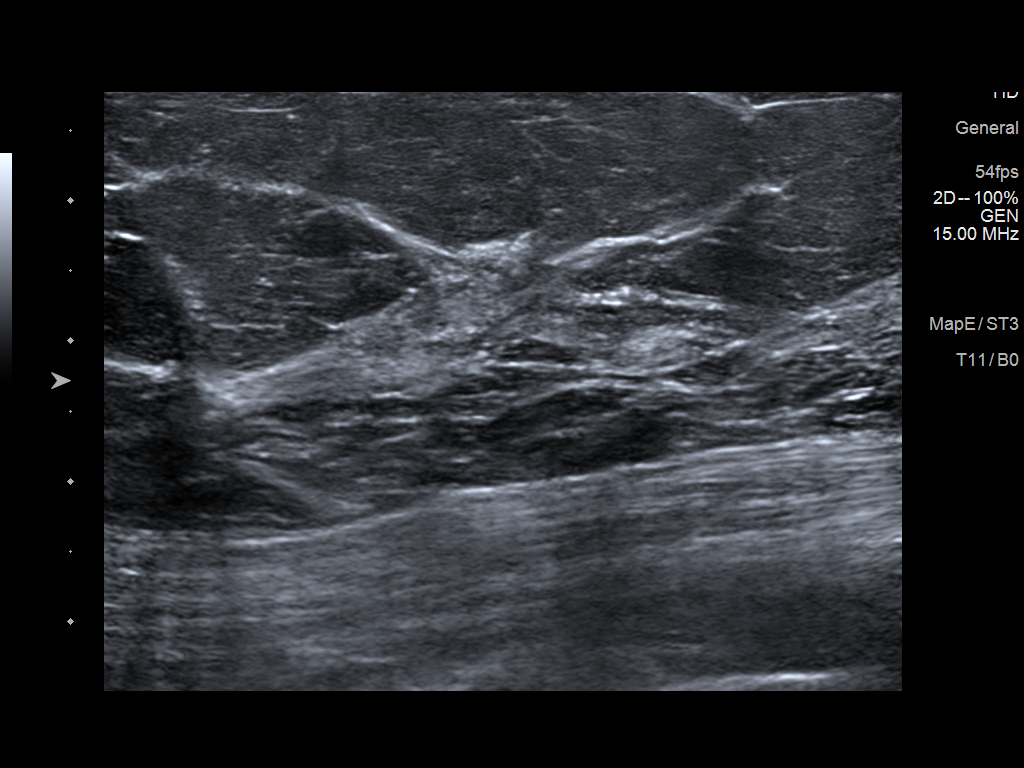

[3 of 3 positions shown; findings below may reference images not displayed]

ACR Breast Density Category b: There are scattered areas of
fibroglandular density.
FINDINGS: Tomosynthesis and synthesized spot-compression CC and MLO views of
the area of concern in the LEFT breast were obtained.

Spot compression images demonstrate no persistent architectural
distortion in the UPPER breast as questioned on the screening
mammogram. There is no mass or suspicious calcifications.

Targeted LEFT breast ultrasound is performed, showing normal
fibroglandular tissue in the UPPER breast with imaging from [DATE]
through [DATE] to [DATE]. At the 1 o'clock position at MIDDLE to
POSTERIOR depth is an island of fibroglandular tissue with
associated Cooper's ligaments that have a similar morphology to the
screening mammographic finding. No cyst, solid mass, abnormal
acoustic shadowing or architectural distortion is identified.
IMPRESSION: No mammographic or sonographic evidence of malignancy involving the
LEFT breast.

RECOMMENDATION:
Screening mammogram in one year.(Code:28-X-LCU)

I have discussed the findings and recommendations with the patient.
If applicable, a reminder letter will be sent to the patient
regarding the next appointment.

BI-RADS CATEGORY  1: Negative.

## 2020-06-12 ENCOUNTER — Other Ambulatory Visit: Payer: Self-pay

## 2020-06-12 DIAGNOSIS — F5101 Primary insomnia: Secondary | ICD-10-CM

## 2020-06-12 MED ORDER — ZOLPIDEM TARTRATE 10 MG PO TABS: ORAL_TABLET | ORAL | 5 refills | Status: DC

## 2020-06-12 NOTE — Telephone Encounter (Signed)
Last refill: 01/08/20 #30, 5 Last OV: 01/25/20 dx. CPE

## 2020-07-03 ENCOUNTER — Other Ambulatory Visit: Payer: Self-pay | Admitting: Family Medicine

## 2020-07-03 DIAGNOSIS — E1169 Type 2 diabetes mellitus with other specified complication: Secondary | ICD-10-CM

## 2020-07-03 DIAGNOSIS — E782 Mixed hyperlipidemia: Secondary | ICD-10-CM

## 2020-07-03 DIAGNOSIS — E139 Other specified diabetes mellitus without complications: Secondary | ICD-10-CM

## 2020-07-19 ENCOUNTER — Other Ambulatory Visit: Payer: Self-pay | Admitting: Family Medicine

## 2020-08-07 ENCOUNTER — Ambulatory Visit: Payer: Self-pay | Admitting: Surgery

## 2020-08-07 NOTE — H&P (Signed)
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 Last colonoscopy was in 2019, Dr. Cyndee Brightly, reports hemorrhoids as the only finding. She states that she has had these hemorrhoids for several years. She has had one in office procedure for these which she thinks was a banding procedure. Lately she has had more frequent episodes of bleeding, about once a week, probably by bowel movements. Denies any particular issues with pain or pruritus. Endorses regular bowel movements, usually diarrhea due to her transplant medications, denies pushing or straining and reports that she spends about 5-10 minutes on the toilet.     Allergies  Allergen Reactions  . Cefuroxime Swelling  . Benadryl [Diphenhydramine Hcl] Other (See Comments)    REACTION: anxiety, hyperactivity  . Ceftin Swelling  . Tape Rash and Other (See Comments)    Paper tape ok. Paper tape ok.    Past Medical History:  Diagnosis Date  . Chronic kidney disease    has kidney transplant - having aphoresis now every other week  . Diabetes mellitus without complication (HCC)    dm type 2 - fasting 150  . History of blood product transfusion   . Hyperlipidemia   . Hypertension   . Menopausal hot flushes 09/06/2017   Controlled on Lexapro by Dr. Corinna Capra  . Neuromuscular disorder [facility])     Past Surgical History:  Procedure Laterality Date  . COLONOSCOPY    . diateck catheter placement  09/2013  . KIDNEY TRANSPLANT    . ORIF ANKLE FRACTURE Left 2014    Family History  Problem Relation Age of Onset  . Hypertension Mother   . Other Father 8       amyloidosis  . Healthy Brother     Social History   Socioeconomic History  . Marital status: Married    Spouse name: Not on file  . Number of children:  1  . Years of education: Not on file  . Highest education level: Not on file  Occupational History  . Occupation: Corporate treasurer to hearing impaired    Employer: [employer]  Tobacco Use  . Smoking status: Former Smoker    Years: 30.00    Types: Cigarettes    Quit date: 12/23/2015    Years since quitting: 4.6  . Smokeless tobacco: Never Used  Vaping Use  . Vaping Use: Never used  Substance and Sexual Activity  . Alcohol use: Yes    Alcohol/week: 1.0 standard drink    Types: 1 Glasses of wine per week    Comment: weekends occasional  . Drug use: No  . Sexual activity: Yes  Other Topics Concern  . Not on file  Social History Narrative  . Not on file   Social Determinants of Health   Financial Resource Strain: Not on file  Food Insecurity: Not on file  Transportation Needs: Not on file  Physical Activity: Not on file  Stress: Not on file  Social Connections: Not on file    Current Outpatient Medications on File Prior to Visit  Medication Sig Dispense Refill  . Accu-Chek FastClix Lancets MISC USE AS DIRECTED UP TO 4 TIMES A DAY 100 each 2  . ACCU-CHEK GUIDE test strip USE TO TEST AS DIRECTED UP TO 4 TIMES DAILY 100 strip 2  . albuterol (PROVENTIL HFA;VENTOLIN HFA) 108 (90 BASE) MCG/ACT  inhaler Inhale into the lungs every 6 (six) hours as needed for wheezing or shortness of breath.    Marland Kitchen amLODipine (NORVASC) 5 MG tablet Take 5 mg by mouth daily.    Marland Kitchen aspirin EC 81 MG tablet Take 81 mg by mouth daily.    Marland Kitchen atenolol (TENORMIN) 100 MG tablet Take 100 mg by mouth daily.    Marland Kitchen atorvastatin (LIPITOR) 40 MG tablet TAKE 1 TABLET(40 MG) BY MOUTH DAILY 90 tablet 3  . benazepril (LOTENSIN) 40 MG tablet Take 40 mg by mouth 2 (two) times daily.    . blood glucose meter kit and supplies KIT Dispense based on patient and insurance preference. Use up to four times daily as directed. 1 each 0  . escitalopram (LEXAPRO) 20 MG tablet Take 1 tablet (20 mg total) by mouth daily. 90  tablet 3  . FARXIGA 5 MG TABS tablet Take 5 mg by mouth daily.    . furosemide (LASIX) 40 MG tablet Take 1 tablet (40 mg total) by mouth daily as needed. 30 tablet 3  . Insulin Glargine (BASAGLAR KWIKPEN) 100 UNIT/ML INJECT 0.1 MLS OR 10 UNITS INTO THE SKIN DAILY 9 mL 0  . Insulin Pen Needle 32G X 4 MM MISC 50 each by Does not apply route in the morning and at bedtime. 50 each 3  . magnesium gluconate (MAGONATE) 500 MG tablet Take 27.5 mg by mouth in the morning, at noon, in the evening, and at bedtime.    . mycophenolate (CELLCEPT) 500 MG tablet Take by mouth 2 (two) times daily.    . Omega-3 Fatty Acids (FISH OIL) 1000 MG CAPS Take 2 capsules by mouth daily.    . predniSONE (DELTASONE) 5 MG tablet Take 5 mg by mouth daily with breakfast.    . sitaGLIPtin-metformin (JANUMET) 50-1000 MG tablet Take 1 tablet by mouth 2 (two) times daily with a meal. 180 tablet 3  . sodium bicarbonate 325 MG tablet Take 650 mg by mouth 3 (three) times daily.     . tacrolimus (PROGRAF) 1 MG capsule Take 1 mg by mouth 2 (two) times daily.     Marland Kitchen zolpidem (AMBIEN) 10 MG tablet TAKE 1 TABLET BY MOUTH EVERY DAY AT BEDTIME AS NEEDED FOR SLEEP 30 tablet 5   Current Facility-Administered Medications on File Prior to Visit  Medication Dose Route Frequency Provider Last Rate Last Admin  . albuterol (PROVENTIL) (2.5 MG/3ML) 0.083% nebulizer solution 2.5 mg  2.5 mg Nebulization Once Leamon Arnt, MD        Review of Systems: General Not Present- Appetite Loss, Chills, Fatigue, Fever, Night Sweats, Weight Gain and Weight Loss. Skin Not Present- Change in Wart/Mole, Dryness, Hives, Jaundice, New Lesions, Non-Healing Wounds, Rash and Ulcer. HEENT Not Present- Earache, Hearing Loss, Hoarseness, Nose Bleed, Oral Ulcers, Ringing in the Ears, Seasonal Allergies, Sinus Pain, Sore Throat, Visual Disturbances, Wears glasses/contact lenses and Yellow Eyes. Respiratory Not Present- Bloody sputum, Chronic Cough, Difficulty Breathing,  Snoring and Wheezing. Breast Not Present- Breast Mass, Breast Pain, Nipple Discharge and Skin Changes. Cardiovascular Not Present- Chest Pain, Difficulty Breathing Lying Down, Leg Cramps, Palpitations, Rapid Heart Rate, Shortness of Breath and Swelling of Extremities. Gastrointestinal Present- Bloody Stool and Hemorrhoids. Not Present- Abdominal Pain, Bloating, Change in Bowel Habits, Chronic diarrhea, Constipation, Difficulty Swallowing, Excessive gas, Gets full quickly at meals, Indigestion, Nausea, Rectal Pain and Vomiting. Female Genitourinary Present- Nocturia. Not Present- Frequency, Painful Urination, Pelvic Pain and Urgency. Musculoskeletal Present- Joint Pain and Swelling of Extremities.  Not Present- Back Pain, Joint Stiffness, Muscle Pain and Muscle Weakness. Neurological Not Present- Decreased Memory, Fainting, Headaches, Numbness, Seizures, Tingling, Tremor, Trouble walking and Weakness. Psychiatric Not Present- Anxiety, Bipolar, Change in Sleep Pattern, Depression, Fearful and Frequent crying. Endocrine Present- Hot flashes. Not Present- Cold Intolerance, Excessive Hunger, Hair Changes, Heat Intolerance and New Diabetes.  Physical Exam: Vitals  Weight: 159.4 lb Height: 63in Body Surface Area: 1.76 m Body Mass Index: 28.24 kg/m  Pulse: 78 (Regular)  BP: 114/62(Sitting, Left Arm, Standard)  She is alert, well-appearing Unlabored respirations On rectal exam she has 3 external skin tags, the posterior one which is quite small does have some hyperkeratosis changes, the left lateral is somewhat pedunculated with overlying skin ulceration. On digital exam there is no mass or tenderness. Sphincter tone is normal. Anoscopy reveals grade 1 internal hemorrhoids without active prolapse.   CBC Latest Ref Rng & Units 01/25/2020 01/19/2019 05/26/2018  WBC 3.8 - 10.8 Thousand/uL 9.7 9.7 8.4  Hemoglobin 11.7 - 15.5 g/dL 15.2 14.3 13.7  Hematocrit 35.0 - 45.0 % 47.3(H) 44.0 41  Platelets  140 - 400 Thousand/uL 275 223.0 214    CMP Latest Ref Rng & Units 01/25/2020 01/19/2019 05/26/2018  Glucose 65 - 99 mg/dL 100(H) 93 -  BUN 7 - 25 mg/dL 25 23 22(A)  Creatinine 0.50 - 1.05 mg/dL 1.28(H) 1.17 1.1  Sodium 135 - 146 mmol/L 139 140 137  Potassium 3.5 - 5.3 mmol/L 4.6 4.3 4.9  Chloride 98 - 110 mmol/L 105 107 -  CO2 20 - 32 mmol/L 24 22 -  Calcium 8.6 - 10.4 mg/dL 9.6 9.4 -  Total Protein 6.1 - 8.1 g/dL 6.5 6.4 -  Total Bilirubin 0.2 - 1.2 mg/dL 0.5 0.6 -  Alkaline Phos 39 - 117 U/L - 73 74  AST 10 - 35 U/L 10 11 9(A)  ALT 6 - 29 U/L _0 Lab Results  Component Value Date   INR 0.90 12/20/2013    Imaging: No results found.   A/P: HEMORRHOIDS (K64.9) Story: On exam today there is nothing that appears suitable for banding and there is no prolapsing internal hemorrhoidal tissue. She does have a couple of unusual appearing skin tags for which I do recommend exam under anesthesia and excision. Possible hemorrhoidectomy at the time of this procedure. We discussed the procedure and risks of bleeding, infection, pain, scarring, hemorrhoid recurrence. Her risk of complications is increased due to her immunosuppression and diabetes. Questions were answered. She wishes to proceed.    Patient Active Problem List   Diagnosis Date Noted  . Insomnia 02/02/2020  . Internal hemorrhoids 12/20/2017  . Menopausal hot flushes 09/06/2017  . 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 Post-transplant diabetes mellitus ([place]) 07/30/2013  . Combined hyperlipidemia associated with type 2 diabetes mellitus ([place]) 07/18/2013  . Proteinuria 07/18/2013  . Sciatica of right side 07/18/2013  . 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  . CKD (chronic kidney disease) 07/18/2013  . Hypertension due to kidney transplant 06/10/2011  . History of living-donor kidney transplantation 04/19/2011  . Long-term use of immunosuppressant medication 04/19/2011       Romana Juniper, MD [facility]  See AMION to contact appropriate on-call provider

## 2020-08-12 ENCOUNTER — Telehealth: Payer: Self-pay

## 2020-08-12 NOTE — Telephone Encounter (Signed)
LMOVM to get names of both medications

## 2020-08-12 NOTE — Telephone Encounter (Signed)
Patient states that the pharmacist told her she should not be taking two medications together. After going thru med list with patient, she was not sure if its the combo of Janumet and [place] or if she was still taking Januvia with Janumet. Became upset when I told her we were not connected with [facility] EMR system and that I did not know what new medications Dr. Signe Colt has recently started her on. Told patient I would route message for PCP to review. Please advise

## 2020-08-12 NOTE — Telephone Encounter (Signed)
Medication Question (non symptomatic) Reason for Call Symptomatic / Request for Health Information Initial Comment Caller states she called this morning for refills. The pharmacist told her that she should not be taking 2 of the meds together. She takes Januvia and another newer one that is similar. Translation No Nurse Assessment Nurse: Michell Heinrich, RN, Lanora Manis Date/Time (Eastern Time): 08/11/2020 5:52:59 PM Confirm and document reason for call. If symptomatic, describe symptoms. ---Caller states that she called the pharmacy to get her medications refilled. States pharmacy states that 2 of her medications should not be taken together. States that she believes one of them was one of her new medications. Does not know the names. Denies any new s/s. Advised caller to call back in the AM and that I would send a note in to the office. Advised to call back if any symptoms. Verbalizes understanding.

## 2020-08-12 NOTE — Telephone Encounter (Signed)
Pt returned call. Asked the name of the medications but pt stated she needs to talk to CMA because she is getting medications confused. Please advise.

## 2020-08-12 NOTE — Telephone Encounter (Signed)
LMOVM for patient to return my call

## 2020-08-20 NOTE — Telephone Encounter (Signed)
Reviewed our current medication list. Current list is ok.  Recommend office visit every 6 months to manage diabetes.  Pt is due. Can bring in bottles of meds at that time to clarify further.

## 2020-08-21 NOTE — Telephone Encounter (Signed)
Discussed with patient that PCP reviewed med list and all medications combined today are OK. Patient is scheduled for in office visit and advised to bring meds into office with her. Medication list mailed to patient. I will call [facility] to get the most UTD medication list that they have faxed to [place].

## 2020-08-25 ENCOUNTER — Other Ambulatory Visit: Payer: Self-pay

## 2020-08-25 ENCOUNTER — Ambulatory Visit: Payer: BC Managed Care – PPO | Admitting: Family Medicine

## 2020-08-27 ENCOUNTER — Ambulatory Visit: Payer: BC Managed Care – PPO | Admitting: Family Medicine

## 2020-09-15 ENCOUNTER — Ambulatory Visit: Payer: BC Managed Care – PPO | Admitting: Family Medicine

## 2020-09-19 ENCOUNTER — Encounter: Payer: Self-pay | Admitting: Family Medicine

## 2020-10-28 ENCOUNTER — Telehealth: Payer: BC Managed Care – PPO | Admitting: Family Medicine

## 2020-11-03 ENCOUNTER — Other Ambulatory Visit: Payer: Self-pay

## 2020-11-03 ENCOUNTER — Ambulatory Visit (INDEPENDENT_AMBULATORY_CARE_PROVIDER_SITE_OTHER): Payer: BC Managed Care – PPO | Admitting: Family Medicine

## 2020-11-03 ENCOUNTER — Encounter: Payer: Self-pay | Admitting: Family Medicine

## 2020-11-03 VITALS — BP 100/65 | HR 68 | Temp 97.1°F | Ht 63.0 in | Wt 157.8 lb

## 2020-11-03 DIAGNOSIS — E782 Mixed hyperlipidemia: Secondary | ICD-10-CM

## 2020-11-03 DIAGNOSIS — E139 Other specified diabetes mellitus without complications: Secondary | ICD-10-CM | POA: Diagnosis not present

## 2020-11-03 DIAGNOSIS — N051 Unspecified nephritic syndrome with focal and segmental glomerular lesions: Secondary | ICD-10-CM

## 2020-11-03 DIAGNOSIS — Z940 Kidney transplant status: Secondary | ICD-10-CM

## 2020-11-03 DIAGNOSIS — Z79899 Other long term (current) drug therapy: Secondary | ICD-10-CM

## 2020-11-03 DIAGNOSIS — R801 Persistent proteinuria, unspecified: Secondary | ICD-10-CM

## 2020-11-03 DIAGNOSIS — N183 Chronic kidney disease, stage 3 unspecified: Secondary | ICD-10-CM

## 2020-11-03 DIAGNOSIS — E1169 Type 2 diabetes mellitus with other specified complication: Secondary | ICD-10-CM | POA: Diagnosis not present

## 2020-11-03 DIAGNOSIS — Z7960 Long term (current) use of unspecified immunomodulators and immunosuppressants: Secondary | ICD-10-CM

## 2020-11-03 DIAGNOSIS — I151 Hypertension secondary to other renal disorders: Secondary | ICD-10-CM

## 2020-11-03 LAB — POCT GLYCOSYLATED HEMOGLOBIN (HGB A1C): Hemoglobin A1C: 7.2 % — AB (ref 4.0–5.6)

## 2020-11-03 MED ORDER — ALBUTEROL SULFATE HFA 108 (90 BASE) MCG/ACT IN AERS
2.0000 | INHALATION_SPRAY | Freq: Four times a day (QID) | RESPIRATORY_TRACT | 5 refills | Status: AC | PRN
Start: 2020-11-03 — End: ?

## 2020-11-03 MED ORDER — BASAGLAR KWIKPEN 100 UNIT/ML ~~LOC~~ SOPN: 10.0000 [IU] | PEN_INJECTOR | Freq: Every day | SUBCUTANEOUS | 0 refills | Status: DC

## 2020-11-03 NOTE — Progress Notes (Signed)
Subjective  CC:  Chief Complaint  Patient presents with   Diabetes   Hypertension   Hyperlipidemia    HPI: Isabella Obrien is a 55 y.o. female who presents to the office today for follow up of diabetes and problems listed above in the chief complaint.  Diabetes follow up: Her diabetic control is reported as Unchanged.  She takes Janumet, , Social research officer, government insulin 10 units nightly.  She has not been checking her sugars.  She denies symptoms of hyperglycemia She denies exertional CP or SOB or symptomatic hypoglycemia. She denies foot sores or paresthesias.  She is due an eye exam Status post renal transplant: Reviewed nephrology notes from March.  We reviewed her medication list in detail.  Still with proteinuria.  Adjusting blood pressure and renal medicines as follows: Was supposed to stop amlodipine and start spironolactone.  However she has continued amlodipine.  Lower extremity edema has improved.  She denies symptoms of hypoglycemia Immunosuppressed on prednisone Recent bronchitic like infection with bronchospasm: She has been using albuterol and Atrovent nebulizers.  She reports she has resolved her infection.  She requests a refill of her albuterol inhaler that is outdated.  Typically rare asthma symptoms.  Wt Readings from Last 3 Encounters:  11/03/20 157 lb 12.8 oz (71.6 kg)  01/25/20 159 lb (72.1 kg)  07/25/19 165 lb 6.4 oz (75 kg)    BP Readings from Last 3 Encounters:  11/03/20 100/65  01/25/20 122/70  07/25/19 126/82    Assessment  1. Post-transplant diabetes mellitus (HCC)   2. Combined hyperlipidemia associated with type 2 diabetes mellitus (HCC)   3. FSGS (focal segmental glomerulosclerosis)   4. Stage 3 chronic kidney disease, unspecified whether stage 3a or 3b CKD (HCC)   5. 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 History of living-donor kidney transplantation   7. Long-term use of immunosuppressant medication   8. Persistent proteinuria      Plan   Diabetes is currently marginally controlled.  We will adjust up basal nighttime insulin to get fastings to goal.  See after visit summary for instructions.  Goal A1c less than 7.  She will work on her diet.  Continue Janumet and [place].  Renal labs are stable. Hypertension: Borderline low blood pressure today.  Stop amlodipine.  Continue atenolol and spironolactone.  Educated patient. Chronic kidney disease and status post renal transplant: Continue immunosuppressants. Asthma: Educated.  Refilled albuterol inhaler.  We will hold off on nebulizers.  If she gets into further episodes with shortness of breath or problems breathing she will follow-up in office. Hyperlipidemia: Well-controlled on statin.  We will recheck fasting levels at next visit.  Follow up: .  3 months to recheck diabetes and complete physical. No orders of the defined types were placed in this encounter.  No orders of the defined types were placed in this encounter.     Immunization History  Administered Date(s) Administered   Influenza Split 05/24/2013   Influenza,inj,Quad PF,6+ Mos 04/12/2011, 01/19/2019, 01/25/2020   Influenza-Unspecified 02/07/2018   PFIZER(Purple Top)SARS-COV-2 Vaccination 07/20/2019, 08/17/2019, 01/12/2020   Pneumococcal Conjugate-13 09/23/2014   Pneumococcal Polysaccharide-23 05/24/2013   Tdap 09/23/2014    Diabetes Related Lab Review: Lab Results  Component Value Date   HGBA1C 6.3 (A) 01/25/2020   HGBA1C 6.9 (A) 07/25/2019   HGBA1C 7.3 (H) 01/19/2019    No results found for: [place]ce[place]ion [place] Lab Results  Component Value Date   CREATININE 1.28 (H) 01/25/2020   BUN 25 01/25/2020  NA 139 01/25/2020   K 4.6 01/25/2020   CL 105 01/25/2020   CO2 24 01/25/2020   Lab Results  Component Value Date   CHOL 127 01/25/2020   CHOL 107 01/19/2019   CHOL 122 08/10/2017   Lab Results  Component Value Date   HDL 43 (L) 01/25/2020   HDL 37.40 (L) 01/19/2019   HDL 39 08/10/2017    Lab Results  Component Value Date   LDLCALC 57 01/25/2020   LDLCALC 57 08/10/2017   LDLCALC 72 07/26/2011   Lab Results  Component Value Date   TRIG 194 (H) 01/25/2020   TRIG 230.0 (H) 01/19/2019   TRIG 283 (A) 08/10/2017   Lab Results  Component Value Date   CHOLHDL 3.0 01/25/2020   CHOLHDL 3 01/19/2019   CHOLHDL 4.3 07/26/2011   Lab Results  Component Value Date   LDLDIRECT 43.0 01/19/2019   The ASCVD Risk score (Goff DC Jr., et al., 2013) failed to calculate for the following reasons:   The valid total cholesterol range is 130 to 320 mg/dL I have reviewed the PMH, Fam and Soc history. Patient Active Problem List   Diagnosis Date Noted   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    Priority: High    Formerly managed by Dr. Sharl Ma, endocrine; stopped in 2019     Combined hyperlipidemia associated with type 2 diabetes mellitus (HCC) 07/18/2013    Priority: High   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    Priority: High    Overview:  S/p living donor transplant, brother     CKD (chronic kidney disease) 07/18/2013    Priority: High    Overview:  Managed by Dr. 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 Troy Sine;      Menopausal hot flushes 09/06/2017    Priority: Low    Controlled on Lexapro by Dr. Rana Snare     Insomnia 02/02/2020   Sciatica of right side 07/18/2013    Overview:  Due to cyst causing L5-S1 nerve irritation - resolved with epidural steroid injection 09/2013, dr. Regino Schultze, [facility] 10/2013      Social  History: Patient  reports that she quit smoking about 4 years ago. Her smoking use included cigarettes. She has never used smokeless tobacco. She reports current alcohol use of about 1.0 standard drink of alcohol per week. She reports that she does not use drugs.  Review of Systems: Ophthalmic: negative for eye pain, loss of vision or double vision Cardiovascular: negative for chest pain Respiratory: negative for SOB or persistent cough Gastrointestinal: negative for abdominal pain Genitourinary: negative for dysuria or gross hematuria MSK: negative for foot lesions Neurologic: negative for weakness or gait disturbance  Objective  Vitals: BP 100/65   Pulse 68   Temp (!) 97.1 F (36.2 C) (Temporal)   Ht 5\' 3"  (1.6 m)   Wt 157 lb 12.8 oz (71.6 kg)   LMP 10/24/2012   SpO2 95%   BMI 27.95 kg/m  General: well appearing, no acute distress  Psych:  Alert and oriented, normal  mood and affect HEENT:  Normocephalic, atraumatic, moist mucous membranes, supple neck  Cardiovascular:  Nl S1 and S2, RRR without murmur, gallop or rub. no edema Respiratory:  Good breath sounds bilaterally, CTAB with normal effort, no rales Gastrointestinal: normal BS, soft, nontender Skin:  Warm, no rashes Neurologic:   Mental status is normal. normal gait Foot exam: no erythema, pallor, or cyanosis visible nl proprioception and sensation to monofilament testing bilaterally, +2 distal pulses bilaterally    Diabetic education: ongoing education regarding chronic disease management for diabetes was given today. We continue to reinforce the ABC's of diabetic management: A1c (<7 or 8 dependent upon patient), tight blood pressure control, and cholesterol management with goal LDL < 100 minimally. We discuss diet strategies, exercise recommendations, medication options and possible side effects. At each visit, we review recommended immunizations and preventive care recommendations for diabetics and stress that good  diabetic control can prevent other problems. See below for this patient's data.   Commons side effects, risks, benefits, and alternatives for medications and treatment plan prescribed today were discussed, and the patient expressed understanding of the given instructions. Patient is instructed to call or message via MyChart if he/she has any questions or concerns regarding our treatment plan. No barriers to understanding were identified. We discussed Red Flag symptoms and signs in detail. Patient expressed understanding regarding what to do in case of urgent or emergency type symptoms.  Medication list was reconciled, printed and provided to the patient in AVS. Patient instructions and summary information was reviewed with the patient as documented in the AVS. This note was prepared with assistance of Dragon voice recognition software. Occasional wrong-word or sound-a-like substitutions may have occurred due to the inherent limitations of voice recognition software  This visit occurred during the SARS-CoV-2 public health emergency.  Safety protocols were in place, including screening questions prior to the visit, additional usage of staff PPE, and extensive cleaning of exam room while observing appropriate contact time as indicated for disinfecting solutions.

## 2020-11-03 NOTE — Patient Instructions (Addendum)
Please return in 3 months for your annual complete physical; please come fasting. We will recheck your diabetes at that time as well.   Please start checking your fasting sugars again. We want them between 90 and 120. IF they are consistently > 120, increase the basaglar insulin un by 2 units every week until the fasting sugars are < 120. ( Can take up to 12-15 units per night if needed).   If you have any questions or concerns, please don't hesitate to send me a message via MyChart or call the office at [phone]. Thank you for visiting with [place] today! It's our pleasure caring for you.

## 2020-11-28 ENCOUNTER — Telehealth: Payer: Self-pay

## 2020-11-28 MED ORDER — BASAGLAR KWIKPEN 100 UNIT/ML ~~LOC~~ SOPN: 10.0000 [IU] | PEN_INJECTOR | Freq: Every day | SUBCUTANEOUS | 1 refills | Status: DC

## 2020-11-28 NOTE — Telephone Encounter (Signed)
  LAST APPOINTMENT DATE: 11/03/2020   NEXT APPOINTMENT DATE:@9 /14/2022  MEDICATION:Insulin Glargine [facility]) 100 UNIT/ML  PHARMACY: [facility]  [address] [place] [place], [place], [place] [address]

## 2020-11-28 NOTE — Telephone Encounter (Signed)
Spoke to pt told her will send Rx for Basaglar 90 day supply to [facility]. Pt verbalized understanding.

## 2020-12-07 ENCOUNTER — Other Ambulatory Visit: Payer: Self-pay | Admitting: Family Medicine

## 2020-12-30 ENCOUNTER — Telehealth: Payer: Self-pay

## 2020-12-30 NOTE — Telephone Encounter (Signed)
Pt stated that she received a no show bill in April. She stated that she called and made sure when her appt was so she could have been here then she stated that she knew she rescheduled an appt. Isabella Obrien then stated that she was never told that she had missed an appt. She also stated that she would not pay the bill. Is there anything we can do?

## 2021-01-01 NOTE — Telephone Encounter (Signed)
I have sent email to charge correction to void.  I have left patent VM in regard.

## 2021-01-29 ENCOUNTER — Other Ambulatory Visit: Payer: Self-pay

## 2021-01-29 ENCOUNTER — Encounter: Payer: Self-pay | Admitting: Family Medicine

## 2021-01-29 ENCOUNTER — Telehealth: Payer: Self-pay

## 2021-01-29 ENCOUNTER — Telehealth (INDEPENDENT_AMBULATORY_CARE_PROVIDER_SITE_OTHER): Payer: BC Managed Care – PPO | Admitting: Family Medicine

## 2021-01-29 DIAGNOSIS — U071 COVID-19: Secondary | ICD-10-CM

## 2021-01-29 MED ORDER — IPRATROPIUM BROMIDE 0.02 % IN SOLN
0.5000 mg | Freq: Two times a day (BID) | RESPIRATORY_TRACT | 12 refills | Status: DC | PRN
Start: 2021-01-29 — End: 2023-11-29

## 2021-01-29 MED ORDER — ALBUTEROL SULFATE (2.5 MG/3ML) 0.083% IN NEBU: 2.5000 mg | INHALATION_SOLUTION | Freq: Two times a day (BID) | RESPIRATORY_TRACT | 1 refills | Status: AC | PRN

## 2021-01-29 NOTE — Telephone Encounter (Signed)
Pt called back for [facility].

## 2021-01-29 NOTE — Patient Instructions (Addendum)
   ---------------------------------------------------------------------------------------------------------------------------      WORK SLIP:  Patient Isabella Obrien,  [birth year], was seen for a medical visit today, 01/29/21 . Please excuse from work for a COVID like illness. We advise 10 days minimum from the onset of symptoms (01/24/21) PLUS 1 day of no fever and improved symptoms. Will defer to employer for a sooner return to work if symptoms have resolved, it is greater than 5 days since the positive test and the patient can wear a high-quality, tight fitting mask such as N95 or KN95 at all times for an additional 5 days. Would also suggest COVID19 antigen testing is negative prior to return.  Sincerely: E-signature: Dr. Kriste Basque, DO [facility] Ph: [phone]   ------------------------------------------------------------------------------------------------------------------------------    HOME CARE TIPS:  -can use tylenol if needed for fevers, aches and pains per instructions; also could try tiger balm  -can use nasal saline a few times per day if you have nasal congestion  -stay hydrated, drink plenty of fluids and eat small healthy meals - avoid dairy  -follow up with your doctor in 2-3 days unless improving and feeling better  -stay home while sick, except to seek medical care. If you have COVID19, ideally it would be best to stay home for a full 10 days since the onset of symptoms PLUS one day of no fever and feeling better. Wear a good mask that fits snugly (such as N95 or KN95) if around others to reduce the risk of transmission.  It was nice to meet you today, and I really hope you are feeling better soon. I help [facility] out with telemedicine visits on Tuesdays and Thursdays and am available for visits on those days. If you have any concerns or questions following this visit please schedule a follow up visit with your Primary Care doctor or  seek care at a local urgent care clinic to avoid delays in care.    Seek in person care or schedule a follow up video visit promptly if your symptoms worsen, new concerns arise, difficulty breathing, chest pain, severe headache or you are not improving with treatment. Call 911 and/or seek emergency care if your symptoms are severe or life threatening.

## 2021-01-29 NOTE — Telephone Encounter (Signed)
Spoke with patient, got medications sent in for her.

## 2021-01-29 NOTE — Telephone Encounter (Signed)
Patient is requesting a refill on her  liquid albuterol for her machine.  States there is another med that goes along with this but not sure what it is.   Patient tested positive for COVID on 9/6.  Has appt with Dr. Selena Batten at 6:20pm.    Would to know if Dr. Mardelle Matte could send this script to her pharmacy - [facility] at [address] today.  States her pharmacy will be closed by the time her appt hits before.   Please follow up with patient in regard.

## 2021-01-29 NOTE — Telephone Encounter (Signed)
Left voicemail for patient to return call.

## 2021-01-29 NOTE — Progress Notes (Signed)
Virtual Visit via Video Note  I connected with Isabella Obrien  on 01/29/21 at  6:20 PM EDT by a video enabled telemedicine application and verified that I am speaking with the correct person using two identifiers.  Location patient: home, [place] Location provider:work or home office Persons participating in the virtual visit: patient, provider  I discussed the limitations of evaluation and management by telemedicine and the availability of in person appointments. The patient expressed understanding and agreed to proceed.   HPI:  Acute telemedicine visit for Covid19: -Onset: 5 days ago, tested positive for covid on Tuesday -reports she talked with PCP and transplant team and they told her not to do the antivirals at this point -Symptoms include: nasal congestion, headache, felt tired, cough - she thought it was a cold -feeling better today some, but wants a work note -also can not take anything except for tylenol for pain and wonders what else she can do for headaches (resolved today) -Denies: fever, CP, SOB, NVD (not changed from baseline), inability to eat/drink/get out of bed -Has tried:tylenol -Pertinent past medical history: see below -Pertinent medication allergies:  Allergies  Allergen Reactions   Cefuroxime Swelling   Benadryl [Diphenhydramine Hcl] Other (See Comments)    REACTION: anxiety, hyperactivity   Ceftin Swelling   Tape Rash and Other (See Comments)    Paper tape ok. Paper tape ok.  -COVID-19 vaccine status: 2 doses and 2 boosters  ROS: See pertinent positives and negatives per HPI.  Past Medical History:  Diagnosis Date   Chronic kidney disease    has kidney transplant - having aphoresis now every other week   Diabetes mellitus without complication (HCC)    dm type 2 - fasting 150   History of blood product transfusion    Hyperlipidemia    Hypertension    Menopausal hot flushes 09/06/2017   Controlled on Lexapro by Dr. Corinna Capra   Neuromuscular disorder [facility])      Past Surgical History:  Procedure Laterality Date   COLONOSCOPY     diateck catheter placement  09/2013   KIDNEY TRANSPLANT     ORIF ANKLE FRACTURE Left 2014     Current Outpatient Medications:    Accu-Chek FastClix Lancets MISC, USE AS DIRECTED UP TO 4 TIMES A DAY, Disp: 100 each, Rfl: 2   ACCU-CHEK GUIDE test strip, USE TO TEST AS DIRECTED UP TO 4 TIMES DAILY, Disp: 100 strip, Rfl: 2   albuterol (PROVENTIL) (2.5 MG/3ML) 0.083% nebulizer solution, Take 3 mLs (2.5 mg total) by nebulization 2 (two) times daily as needed for wheezing or shortness of breath., Disp: 150 mL, Rfl: 1   albuterol (VENTOLIN HFA) 108 (90 Base) MCG/ACT inhaler, Inhale 2 puffs into the lungs every 6 (six) hours as needed for wheezing or shortness of breath., Disp: 1 each, Rfl: 5   aspirin EC 81 MG tablet, Take 81 mg by mouth daily., Disp: , Rfl:    atenolol (TENORMIN) 100 MG tablet, Take 100 mg by mouth daily., Disp: , Rfl:    atorvastatin (LIPITOR) 40 MG tablet, TAKE 1 TABLET(40 MG) BY MOUTH DAILY, Disp: 90 tablet, Rfl: 3   benazepril (LOTENSIN) 40 MG tablet, Take 40 mg by mouth 2 (two) times daily., Disp: , Rfl:    blood glucose meter kit and supplies KIT, Dispense based on patient and insurance preference. Use up to four times daily as directed., Disp: 1 each, Rfl: 0   dapagliflozin propanediol (FARXIGA) 10 MG TABS tablet, Take 10 mg by mouth daily., Disp: ,  Rfl:    furosemide (LASIX) 40 MG tablet, Take 1 tablet (40 mg total) by mouth daily as needed., Disp: 30 tablet, Rfl: 3   Insulin Glargine (BASAGLAR KWIKPEN) 100 UNIT/ML, Inject 10 Units into the skin at bedtime., Disp: 9 mL, Rfl: 1   Insulin Pen Needle 32G X 4 MM MISC, 50 each by Does not apply route in the morning and at bedtime., Disp: 50 each, Rfl: 3   ipratropium (ATROVENT) 0.02 % nebulizer solution, Take 2.5 mLs (0.5 mg total) by nebulization 2 (two) times daily as needed for wheezing or shortness of breath., Disp: 75 mL, Rfl: 12   magnesium gluconate  (MAGONATE) 500 MG tablet, Take 27.5 mg by mouth in the morning, at noon, in the evening, and at bedtime., Disp: , Rfl:    mycophenolate (CELLCEPT) 500 MG tablet, Take by mouth 2 (two) times daily., Disp: , Rfl:    Omega-3 Fatty Acids (FISH OIL) 1000 MG CAPS, Take 2 capsules by mouth daily., Disp: , Rfl:    predniSONE (DELTASONE) 5 MG tablet, Take 5 mg by mouth daily with breakfast., Disp: , Rfl:    sitaGLIPtin-metformin (JANUMET) 50-1000 MG tablet, Take 1 tablet by mouth 2 (two) times daily with a meal., Disp: 180 tablet, Rfl: 3   sodium bicarbonate 325 MG tablet, Take 650 mg by mouth 3 (three) times daily. , Disp: , Rfl:    spironolactone (ALDACTONE) 25 MG tablet, Take 1 tablet by mouth daily., Disp: , Rfl:    tacrolimus (PROGRAF) 1 MG capsule, Take 1 mg by mouth 2 (two) times daily. , Disp: , Rfl:    zolpidem (AMBIEN) 10 MG tablet, TAKE 1 TABLET BY MOUTH EVERY DAY AT BEDTIME AS NEEDED FOR SLEEP, Disp: 30 tablet, Rfl: 5  EXAM:  VITALS per patient if applicable:  GENERAL: alert, oriented, appears well and in no acute distress  HEENT: atraumatic, conjunttiva clear, no obvious abnormalities on inspection of external nose and ears  NECK: normal movements of the head and neck  LUNGS: on inspection no signs of respiratory distress, breathing rate appears normal, no obvious gross SOB, gasping or wheezing  CV: no obvious cyanosis  MS: moves all visible extremities without noticeable abnormality  PSYCH/NEURO: pleasant and cooperative, no obvious depression or anxiety, speech and thought processing grossly intact  ASSESSMENT AND PLAN:  Discussed the following assessment and plan:  COVID-19   Discussed treatment options (infusions and oral options and risk of drug interactions), ideal treatment window, potential complications, isolation and precautions for COVID-19. She reports PCP/transplant team advised not to take treatment at this point and patient declined antiviral treatment at this  time. She can only take tylenol for the headaches and discussed not going over max dose. She also could try topical menthol - at this point she reports the headaches have resolved. Other symptomatic care measures summarized in patient instructions. Work/School slipped offered: provided in patient instructions   Advised to seek prompt in person care if worsening, new symptoms arise, or if is not continuing to improve.  Discussed options for inperson care if PCP office not available.    I discussed the assessment and treatment plan with the patient. The patient was provided an opportunity to ask questions and all were answered. The patient agreed with the plan and demonstrated an understanding of the instructions.     Lucretia Kern, DO

## 2021-01-30 ENCOUNTER — Other Ambulatory Visit: Payer: Self-pay | Admitting: Family Medicine

## 2021-02-02 ENCOUNTER — Telehealth: Payer: Self-pay

## 2021-02-02 NOTE — Telephone Encounter (Signed)
[facility] [place] TELEPHONE ADVICE RECORD [facility] Patient Name: Isabella Obrien Gender: Female DOB: 1965/08/13 Age: 55 Y 6 M Return Phone Number: [phone] (Primary) Address: City/ State/ Zip: [place]  [address] Client [facility] - Human resources officer [facility] Night Physician Asencion Partridge- MD Contact Type Call Who Is Calling Patient / Member / Family / Caregiver Call Type Triage / Clinical Relationship To Patient Self Return Phone Number [phone] (Primary) Chief Complaint BREATHING - shortness of breath or sounds breathless Reason for Call Symptomatic / Request for Health Information Initial Comment Caller needed to speak to provider about z-pak for lungs. Caller has covid and has gone i/o lungs, before gets bad wants something for it. When breathe feels moving around like stuff in lungs. Translation No Nurse Assessment Nurse: Letta Pate, RN, Ana Date/Time (Eastern Time): 01/31/2021 11:51:23 AM Confirm and document reason for call. If symptomatic, describe symptoms. ---Caller states she tested positive for covid, symptoms started last Saturday, still having some symptoms, states she feels like there is something in her lungs still. States it is hard to sleep and breathe. Has been doing nebulizer treatments but still has symptoms. States MD called in new rx for nebs yesterday or day before. States even after the nebs she is still having chest congestion. Does the patient have any new or worsening symptoms? ---Yes Will a triage be completed? ---Yes Related visit to physician within the last 2 weeks? ---Yes Does the PT have any chronic conditions? (i.e. diabetes, asthma, this includes High risk factors for pregnancy, etc.) ---Yes List chronic conditions. ---kidney transplant Is the patient pregnant or possibly pregnant? (Ask all females between the ages of  72-55) ---No Is this a behavioral health or substance abuse call? ---No PLEASE NOTE: All timestamps contained within this report are represented as [place] Standard Time. CONFIDENTIALTY NOTICE: This fax transmission is intended only for the addressee. It contains information that is legally privileged, confidential or otherwise protected from use or disclosure. If you are not the intended recipient, you are strictly prohibited from reviewing, disclosing, copying using or disseminating any of this information or taking any action in reliance on or regarding this information. If you have received this fax in error, please notify us immediately by telephone so that we can arrange for its return to [place]. Phone: [phone], Toll-Free: [phone], Fax: [phone] Page: 2 of 2 Call Id: [id number] Guidelines Guideline Title Affirmed Question Affirmed Notes Nurse Date/Time Lamount Cohen Time) COVID-19 - Diagnosed or Suspected Chest pain or pressure (Exception: MILD central chest pain, present only when coughing) Letta Pate, RN, Ana 01/31/2021 11:54:07 AM Disp. Time Lamount Cohen Time) Disposition Final User 01/31/2021 11:50:20 AM Send to Urgent Kyra Searles 01/31/2021 12:24:19 PM Called On-Call Provider Letta Pate, RN, Ana 01/31/2021 12:08:32 PM Go to ED Now (or PCP triage) Yes Letta Pate, RN, Dahlia Bailiff Disagree/Comply Comply Caller Understands Yes PreDisposition Did not know what to do Care Advice Given Per Guideline GO TO ED NOW (OR PCP TRIAGE): * IF NO PCP (PRIMARY CARE PROVIDER) SECOND-LEVEL TRIAGE: You need to be seen within the next hour. Go to the ED/UCC at _____________ Hospital. Leave as soon as you can. CARE ADVICE given per COVID-19 - DIAGNOSED OR SUSPECTED (Adult) guideline. Referrals GO TO FACILITY UNDECIDED Paging DoctorName Phone DateTime Result/ Outcome Message Type Notes Berniece Andreas - MD [phone] 01/31/2021 12:24:19 PM Called On Call Provider - Reached Doctor  Paged Berniece Andreas - MD 01/31/2021  12:24:45 PM Spoke with On Call - General Message Result Provider states patient needs to be examined, she cannot call anything in without patient being evaluated in person

## 2021-02-04 ENCOUNTER — Encounter: Payer: BC Managed Care – PPO | Admitting: Family Medicine

## 2021-03-18 ENCOUNTER — Encounter: Payer: BC Managed Care – PPO | Admitting: Family Medicine

## 2021-04-09 ENCOUNTER — Other Ambulatory Visit: Payer: Self-pay | Admitting: Family Medicine

## 2021-04-29 ENCOUNTER — Encounter: Payer: BC Managed Care – PPO | Admitting: Family Medicine

## 2021-05-11 ENCOUNTER — Other Ambulatory Visit: Payer: Self-pay | Admitting: Family Medicine

## 2021-05-29 ENCOUNTER — Telehealth: Payer: Self-pay | Admitting: Family Medicine

## 2021-05-29 NOTE — Telephone Encounter (Signed)
Patient is no longer a patient with Dr.Andy.

## 2021-05-29 NOTE — Telephone Encounter (Signed)
Pt called and said he needs a refill on zolpidem (AMBIEN) 10 MG tablet [facility] [place] [facility] [place], phone number:[phone] Pt call back if needed is [phone]

## 2021-06-04 ENCOUNTER — Other Ambulatory Visit: Payer: Self-pay | Admitting: Family Medicine

## 2021-06-04 DIAGNOSIS — F5101 Primary insomnia: Secondary | ICD-10-CM

## 2021-06-04 NOTE — Telephone Encounter (Signed)
Please advise 

## 2021-06-04 NOTE — Telephone Encounter (Signed)
Patient will need refill for all the prescriptions that Dr. Mardelle Matte prescribe her. Patient would like a call back regarding if these medications can be sent in.

## 2021-06-04 NOTE — Telephone Encounter (Signed)
Patient states she cant get in with her new provider till June and her insurance is no longer covered with [facility]. Patient would like to see if all her medications could be sent in at least till she is seen.

## 2021-06-05 ENCOUNTER — Other Ambulatory Visit: Payer: Self-pay

## 2021-06-05 MED ORDER — JANUMET 50-1000 MG PO TABS: ORAL_TABLET | ORAL | 3 refills | Status: DC

## 2021-06-05 MED ORDER — BASAGLAR KWIKPEN 100 UNIT/ML ~~LOC~~ SOPN: 10.0000 [IU] | PEN_INJECTOR | Freq: Every day | SUBCUTANEOUS | 1 refills | Status: DC

## 2021-06-05 NOTE — Telephone Encounter (Signed)
Since you are out of the office next week, patient would like to know if you are okay with her seeing someone else just so she can get her refills? Please advise

## 2021-06-05 NOTE — Telephone Encounter (Signed)
Medication already ordered

## 2021-06-05 NOTE — Telephone Encounter (Signed)
Please call and schedule patient for ADHD follow up. She is aware Dr.Andy is out next week.

## 2021-06-08 NOTE — Telephone Encounter (Signed)
Scheduled

## 2021-06-23 ENCOUNTER — Other Ambulatory Visit: Payer: Self-pay | Admitting: Family Medicine

## 2021-06-23 DIAGNOSIS — E1169 Type 2 diabetes mellitus with other specified complication: Secondary | ICD-10-CM

## 2021-06-24 ENCOUNTER — Other Ambulatory Visit: Payer: Self-pay

## 2021-06-24 ENCOUNTER — Encounter: Payer: Self-pay | Admitting: Family Medicine

## 2021-06-24 ENCOUNTER — Ambulatory Visit: Payer: BC Managed Care – PPO | Admitting: Family Medicine

## 2021-06-24 VITALS — BP 132/90 | HR 60 | Temp 97.3°F | Ht 63.0 in | Wt 154.8 lb

## 2021-06-24 DIAGNOSIS — N183 Chronic kidney disease, stage 3 unspecified: Secondary | ICD-10-CM

## 2021-06-24 DIAGNOSIS — L089 Local infection of the skin and subcutaneous tissue, unspecified: Secondary | ICD-10-CM | POA: Diagnosis not present

## 2021-06-24 DIAGNOSIS — E1169 Type 2 diabetes mellitus with other specified complication: Secondary | ICD-10-CM | POA: Diagnosis not present

## 2021-06-24 DIAGNOSIS — E139 Other specified diabetes mellitus without complications: Secondary | ICD-10-CM

## 2021-06-24 DIAGNOSIS — I151 Hypertension secondary to other renal disorders: Secondary | ICD-10-CM

## 2021-06-24 DIAGNOSIS — B351 Tinea unguium: Secondary | ICD-10-CM

## 2021-06-24 DIAGNOSIS — I10 Essential (primary) hypertension: Secondary | ICD-10-CM | POA: Diagnosis not present

## 2021-06-24 DIAGNOSIS — E782 Mixed hyperlipidemia: Secondary | ICD-10-CM | POA: Diagnosis not present

## 2021-06-24 DIAGNOSIS — K648 Other hemorrhoids: Secondary | ICD-10-CM

## 2021-06-24 DIAGNOSIS — Z940 Kidney transplant status: Secondary | ICD-10-CM

## 2021-06-24 LAB — POCT GLYCOSYLATED HEMOGLOBIN (HGB A1C): Hemoglobin A1C: 7.1 % — AB (ref 4.0–5.6)

## 2021-06-24 MED ORDER — BASAGLAR KWIKPEN 100 UNIT/ML ~~LOC~~ SOPN: 10.0000 [IU] | PEN_INJECTOR | Freq: Every day | SUBCUTANEOUS | 3 refills | Status: DC

## 2021-06-24 MED ORDER — ATORVASTATIN CALCIUM 40 MG PO TABS: 40.0000 mg | ORAL_TABLET | Freq: Every evening | ORAL | 3 refills | Status: DC

## 2021-06-24 MED ORDER — FUROSEMIDE 40 MG PO TABS: 40.0000 mg | ORAL_TABLET | Freq: Every day | ORAL | 3 refills | Status: AC | PRN

## 2021-06-24 MED ORDER — BACTROBAN NASAL 2 % NA OINT: 1.0000 "application " | TOPICAL_OINTMENT | Freq: Two times a day (BID) | NASAL | 0 refills | Status: DC

## 2021-06-24 NOTE — Patient Instructions (Signed)
Please return in 3 months for diabetes recheck and complete physical.   Check your fasting sugars and increase your basaglar to 12-14 units to keep fasting sugars < 120.   If you have any questions or concerns, please don't hesitate to send me a message via MyChart or call the office at [phone]. Thank you for visiting with Isabella Obrien today! It's our pleasure caring for you.

## 2021-06-24 NOTE — Progress Notes (Signed)
Subjective  CC:  Chief Complaint  Patient presents with   Follow-up    Review medications, pt has a sore on her nose pt stated the sore is getting bigger, toenail fungus     HPI: Isabella Obrien is a 56 y.o. female who presents to the office today for follow up of diabetes and problems listed above in the chief complaint.  Diabetes follow up: Her diabetic control is reported as Unchanged. Has questions about glp-1. On farxiga, janumet and basaglar. Last June asked to increase dose up but pt didn't. Doesn't check sugars. Feels well.  She denies exertional CP or SOB or symptomatic hypoglycemia. She denies foot sores or paresthesias.  HLD on statin due for recheck. Needs refills HTN: managed by renal; s/p renal tx. No cp Has small nodule on inner right nare x 2 years but now with soreness and inflamed. Wants to be sure not cancer.  Toenail fungus left great toe; has treated with topicals. No pain Hemorrhoids and bright red blood on TP intermittently. No melena, change in stooling, abdominal pain or weight loss. Colonoscopy nl 3 years ago. GYN gave her a hemorrhoidal cream.   Wt Readings from Last 3 Encounters:  06/24/21 154 lb 12.8 oz (70.2 kg)  11/03/20 157 lb 12.8 oz (71.6 kg)  01/25/20 159 lb (72.1 kg)    BP Readings from Last 3 Encounters:  06/24/21 132/90  11/03/20 100/65  01/25/20 122/70    Assessment  1. 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 Combined hyperlipidemia associated with type 2 diabetes mellitus (HCC)   3. Stage 3 chronic kidney disease, unspecified whether stage 3a or 3b CKD ([place])   4. Hypertension due to kidney transplant   5. Pustule of nostril   6. Onychomycosis   7. Internal hemorrhoid, bleeding      Plan  Diabetes is currently adequately controlled. Check labs. Continue current meds with slight increase in basaglar dose. Recheck 3 months. HLD: recheck on statin HTN: mildly elevated diastolic. Continue same meds and recheck 3 months CKD and s/p  transplant: see renal q 60months. Reports stable Pustule: bactroban. F/u if not improved.  Fungal toenail. Avoid oral antifungals. Reassured. To podiatry if needed Hemorroids. Reassured. No red flags.   I spent a total of 40 minutes for this patient encounter. Time spent included preparation, face-to-face counseling with the patient and coordination of care, review of chart and records, and documentation of the encounter.   Follow up: 3 mo for cpe and recheck dm and HTN. Orders Placed This Encounter  Procedures   CBC with Differential/Platelet   Lipid panel   Renal function panel   Hepatic function panel   POCT HgB A1C   Meds ordered this encounter  Medications   mupirocin nasal ointment (BACTROBAN NASAL) 2 %    Sig: Place 1 application into the nose 2 (two) times daily. Use one-half of tube in each nostril twice daily for five (5) days. After application, press sides of nose together and gently massage.    Dispense:  10 g    Refill:  0   atorvastatin (LIPITOR) 40 MG tablet    Sig: Take 1 tablet (40 mg total) by mouth at bedtime.    Dispense:  90 tablet    Refill:  3   furosemide (LASIX) 40 MG tablet    Sig: Take 1 tablet (40 mg total) by mouth daily as needed.    Dispense:  30 tablet    Refill:  3  Insulin Glargine (BASAGLAR KWIKPEN) 100 UNIT/ML    Sig: Inject 10 Units into the skin at bedtime.    Dispense:  9 mL    Refill:  3      Immunization History  Administered Date(s) Administered   Influenza Split 05/24/2013   Influenza,inj,Quad PF,6+ Mos 04/12/2011, 01/19/2019, 01/25/2020   Influenza-Unspecified 02/07/2018   PFIZER(Purple Top)SARS-COV-2 Vaccination 07/20/2019, 08/17/2019, 01/12/2020   Pneumococcal Conjugate-13 09/23/2014   Pneumococcal Polysaccharide-23 05/24/2013   Tdap 09/23/2014    Diabetes Related Lab Review: Lab Results  Component Value Date   HGBA1C 7.1 (A) 06/24/2021   HGBA1C 7.2 (A) 11/03/2020   HGBA1C 6.3 (A) 01/25/2020    No results found  for: [place] Lab Results  Component Value Date   CREATININE 1.28 (H) 01/25/2020   BUN 25 01/25/2020   NA 139 01/25/2020   K 4.6 01/25/2020   CL 105 01/25/2020   CO2 24 01/25/2020   Lab Results  Component Value Date   CHOL 127 01/25/2020   CHOL 107 01/19/2019   CHOL 122 08/10/2017   Lab Results  Component Value Date   HDL 43 (L) 01/25/2020   HDL 37.40 (L) 01/19/2019   HDL 39 08/10/2017   Lab Results  Component Value Date   LDLCALC 57 01/25/2020   LDLCALC 57 08/10/2017   LDLCALC 72 07/26/2011   Lab Results  Component Value Date   TRIG 194 (H) 01/25/2020   TRIG 230.0 (H) 01/19/2019   TRIG 283 (A) 08/10/2017   Lab Results  Component Value Date   CHOLHDL 3.0 01/25/2020   CHOLHDL 3 01/19/2019   CHOLHDL 4.3 07/26/2011   Lab Results  Component Value Date   LDLDIRECT 43.0 01/19/2019   The ASCVD Risk score (Arnett DK, et al., 2019) failed to calculate for the following reasons:   The valid total cholesterol range is 130 to 320 mg/dL I have reviewed the PMH, Fam and Soc history. Patient Active Problem List   Diagnosis Date Noted   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    Priority: High    Formerly managed by Dr. Sharl Ma, endocrine; stopped in 2019    Combined hyperlipidemia associated with type 2 diabetes mellitus (HCC) 07/18/2013    Priority: High   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    Priority: High    Overview:  S/p living donor transplant, brother    CKD (chronic kidney disease) 07/18/2013    Priority: High    Overview:  Managed by Dr. 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 Troy Sine;     Menopausal hot flushes 09/06/2017    Priority: Low    Controlled on Lexapro by Dr. Rana Snare    Insomnia 02/02/2020   Sciatica of right side 07/18/2013    Overview:  Due to cyst causing L5-S1 nerve irritation - resolved with epidural steroid injection 09/2013, dr. Regino Schultze, [facility] 10/2013     Social History: Patient  reports that she quit smoking about 5 years ago. Her smoking use included cigarettes. She has never used smokeless tobacco. She  reports current alcohol use of about 1.0 standard drink per week. She reports that she does not use drugs.  Review of Systems: Ophthalmic: negative for eye pain, loss of vision or double vision Cardiovascular: negative for chest pain Respiratory: negative for SOB or persistent cough Gastrointestinal: negative for abdominal pain Genitourinary: negative for dysuria or gross hematuria MSK: negative for foot lesions Neurologic: negative for weakness or gait disturbance  Objective  Vitals: BP 132/90    Pulse 60    Temp (!) 97.3 F (36.3 C) (Temporal)    Ht 5\' 3"  (1.6 m)    Wt 154 lb 12.8 oz (70.2 kg)    LMP 10/24/2012    SpO2 96%    BMI 27.42 kg/m  General: well appearing, no acute distress  Psych:  Alert and oriented, normal mood and affect HEENT:  Normocephalic, atraumatic, moist mucous membranes, supple neck , right medial nare with small pustule, red and swollen Cardiovascular:  Nl S1 and S2, RRR without murmur, gallop or rub. no edema Respiratory:  Good breath sounds bilaterally, CTAB with normal effort, no rales Foot exam: no erythema, pallor, or cyanosis visible nl proprioception and sensation to monofilament testing bilaterally, +2 distal pulses bilaterally    Diabetic education: ongoing education regarding chronic disease management for diabetes was given today. We continue to reinforce the ABC's of diabetic management: A1c (<7 or 8 dependent upon  patient), tight blood pressure control, and cholesterol management with goal LDL < 100 minimally. We discuss diet strategies, exercise recommendations, medication options and possible side effects. At each visit, we review recommended immunizations and preventive care recommendations for diabetics and stress that good diabetic control can prevent other problems. See below for this patient's data.   Commons side effects, risks, benefits, and alternatives for medications and treatment plan prescribed today were discussed, and the patient expressed understanding of the given instructions. Patient is instructed to call or message via MyChart if he/she has any questions or concerns regarding our treatment plan. No barriers to understanding were identified. We discussed Red Flag symptoms and signs in detail. Patient expressed understanding regarding what to do in case of urgent or emergency type symptoms.  Medication list was reconciled, printed and provided to the patient in AVS. Patient instructions and summary information was reviewed with the patient as documented in the AVS. This note was prepared with assistance of Dragon voice recognition software. Occasional wrong-word or sound-a-like substitutions may have occurred due to the inherent limitations of voice recognition software  This visit occurred during the SARS-CoV-2 public health emergency.  Safety protocols were in place, including screening questions prior to the visit, additional usage of staff PPE, and extensive cleaning of exam room while observing appropriate contact time as indicated for disinfecting solutions.

## 2021-06-25 LAB — RENAL FUNCTION PANEL
Albumin: 4.1 g/dL (ref 3.5–5.2)
BUN: 27 mg/dL — ABNORMAL HIGH (ref 6–23)
CO2: 24 mEq/L (ref 19–32)
Calcium: 9.5 mg/dL (ref 8.4–10.5)
Chloride: 104 mEq/L (ref 96–112)
Creatinine, Ser: 1.67 mg/dL — ABNORMAL HIGH (ref 0.40–1.20)
GFR: 34.66 mL/min — ABNORMAL LOW (ref >60.00)
Glucose, Bld: 114 mg/dL — ABNORMAL HIGH (ref 70–99)
Phosphorus: 4.4 mg/dL (ref 2.3–4.6)
Potassium: 5.1 mEq/L (ref 3.5–5.1)
Sodium: 138 mEq/L (ref 135–145)

## 2021-06-25 LAB — HEPATIC FUNCTION PANEL
ALT: 19 U/L (ref 0–35)
AST: 14 U/L (ref 0–37)
Albumin: 4.1 g/dL (ref 3.5–5.2)
Alkaline Phosphatase: 83 U/L (ref 39–117)
Bilirubin, Direct: 0.1 mg/dL (ref 0.0–0.3)
Total Bilirubin: 0.5 mg/dL (ref 0.2–1.2)
Total Protein: 6.3 g/dL (ref 6.0–8.3)

## 2021-06-25 LAB — CBC WITH DIFFERENTIAL/PLATELET
Basophils Absolute: 0.1 10*3/uL (ref 0.0–0.1)
Basophils Relative: 0.6 % (ref 0.0–3.0)
Eosinophils Absolute: 0.1 10*3/uL (ref 0.0–0.7)
Eosinophils Relative: 1.6 % (ref 0.0–5.0)
HCT: 47.7 % — ABNORMAL HIGH (ref 36.0–46.0)
Hemoglobin: 15.4 g/dL — ABNORMAL HIGH (ref 12.0–15.0)
Lymphocytes Relative: 12.7 % (ref 12.0–46.0)
Lymphs Abs: 1.2 10*3/uL (ref 0.7–4.0)
MCHC: 32.2 g/dL (ref 30.0–36.0)
MCV: 90.5 fl (ref 78.0–100.0)
Monocytes Absolute: 0.6 10*3/uL (ref 0.1–1.0)
Monocytes Relative: 6.9 % (ref 3.0–12.0)
Neutro Abs: 7.1 10*3/uL (ref 1.4–7.7)
Neutrophils Relative %: 78.2 % — ABNORMAL HIGH (ref 43.0–77.0)
Platelets: 238 10*3/uL (ref 150.0–400.0)
RBC: 5.27 Mil/uL — ABNORMAL HIGH (ref 3.87–5.11)
RDW: 14.4 % (ref 11.5–15.5)
WBC: 9.1 10*3/uL (ref 4.0–10.5)

## 2021-06-25 LAB — LIPID PANEL
Cholesterol: 112 mg/dL (ref 0–200)
HDL: 38.9 mg/dL — ABNORMAL LOW (ref >39.00)
NonHDL: 73.18
Total CHOL/HDL Ratio: 3
Triglycerides: 268 mg/dL — ABNORMAL HIGH (ref 0.0–149.0)
VLDL: 53.6 mg/dL — ABNORMAL HIGH (ref 0.0–40.0)

## 2021-06-25 LAB — LDL CHOLESTEROL, DIRECT: Direct LDL: 45 mg/dL

## 2021-08-12 LAB — HM DIABETES EYE EXAM

## 2021-08-27 ENCOUNTER — Encounter: Payer: Self-pay | Admitting: Family Medicine

## 2021-10-14 ENCOUNTER — Encounter: Payer: BC Managed Care – PPO | Admitting: Family Medicine

## 2021-12-23 ENCOUNTER — Telehealth: Payer: Self-pay

## 2021-12-23 NOTE — Telephone Encounter (Signed)
Notes scanned to referral 

## 2022-01-07 ENCOUNTER — Ambulatory Visit (INDEPENDENT_AMBULATORY_CARE_PROVIDER_SITE_OTHER): Payer: BC Managed Care – PPO | Admitting: Internal Medicine

## 2022-01-07 ENCOUNTER — Encounter: Payer: Self-pay | Admitting: Internal Medicine

## 2022-01-07 VITALS — BP 148/94 | HR 63 | Ht 62.5 in | Wt 147.6 lb

## 2022-01-07 DIAGNOSIS — R002 Palpitations: Secondary | ICD-10-CM | POA: Diagnosis not present

## 2022-01-07 NOTE — Patient Instructions (Signed)

## 2022-01-07 NOTE — Progress Notes (Signed)
Cardiology Office Note:    Date:  01/07/2022   ID:  Isabella Obrien, DOB 1965-12-20, MRN [id number]  PCP:  Caren Macadam, MD   [place] [facility] Cardiologist:  Janina Mayo, MD     Referring MD: Caren Macadam, MD   No chief complaint on file. Abnormal EKG  History of Present Illness:    Isabella Obrien is a 56 y.o. female with a hx of s/p renal tx c/b CKD s/p plasmapheresis, T2DM referral for palpitations and abnormal EKG.  EKG shows TWI anterolateral prior more pronounced than on prior EKG, but seen prior in 2015.  She is asymptomatic.   She feels palpitations. She is going through menopause. It's about every night. No syncope. She drinks 1 coke. She does smoke 1/2 ppd.  Long-term smoker, she stopped for awhile. Then she restarted recently  She had a pre-transplant stress test with lexiscan ~ 2006. They were normal   Past Medical History:  Diagnosis Date   Chronic kidney disease    has kidney transplant - having aphoresis now every other week   Diabetes mellitus without complication ([place])    dm type 2 - fasting 150   History of blood product transfusion    Hyperlipidemia    Hypertension    Menopausal hot flushes 09/06/2017   Controlled on Lexapro by Dr. Corinna Capra   Neuromuscular disorder [facility])     Past Surgical History:  Procedure Laterality Date   COLONOSCOPY     diateck catheter placement  09/2013   KIDNEY TRANSPLANT     ORIF ANKLE FRACTURE Left 2014    Current Medications: Current Meds  Medication Sig   Accu-Chek FastClix Lancets MISC USE AS DIRECTED UP TO FOUR TIMES DAILY   ACCU-CHEK GUIDE test strip USE TO TEST AS DIRECTED UP TO 4 TIMES DAILY   albuterol (PROVENTIL) (2.5 MG/3ML) 0.083% nebulizer solution Take 3 mLs (2.5 mg total) by nebulization 2 (two) times daily as needed for wheezing or shortness of breath.   albuterol (VENTOLIN HFA) 108 (90 Base) MCG/ACT inhaler Inhale 2 puffs into the lungs every 6 (six) hours as needed for wheezing or  shortness of breath.   aspirin EC 81 MG tablet Take 81 mg by mouth daily.   atenolol (TENORMIN) 100 MG tablet Take 100 mg by mouth daily.   atorvastatin (LIPITOR) 40 MG tablet Take 1 tablet (40 mg total) by mouth at bedtime.   benazepril (LOTENSIN) 40 MG tablet Take 40 mg by mouth 2 (two) times daily.   blood glucose meter kit and supplies KIT Dispense based on patient and insurance preference. Use up to four times daily as directed.   dapagliflozin propanediol (FARXIGA) 10 MG TABS tablet Take 10 mg by mouth daily.   furosemide (LASIX) 40 MG tablet Take 1 tablet (40 mg total) by mouth daily as needed.   Insulin Glargine (BASAGLAR KWIKPEN) 100 UNIT/ML Inject 10 Units into the skin at bedtime. (Patient taking differently: Inject 12 Units into the skin at bedtime.)   Insulin Pen Needle 32G X 4 MM MISC 50 each by Does not apply route in the morning and at bedtime.   ipratropium (ATROVENT) 0.02 % nebulizer solution Take 2.5 mLs (0.5 mg total) by nebulization 2 (two) times daily as needed for wheezing or shortness of breath.   mycophenolate (CELLCEPT) 500 MG tablet Take by mouth 2 (two) times daily.   Omega-3 Fatty Acids (FISH OIL) 1000 MG CAPS Take 2 capsules by mouth daily.   predniSONE (DELTASONE)  5 MG tablet Take 5 mg by mouth daily with breakfast.   sitaGLIPtin-metformin (JANUMET) 50-1000 MG tablet TAKE 1 TABLET BY MOUTH TWICE DAILY( EVERY TWELVE HOURS)   sodium bicarbonate 325 MG tablet Take 650 mg by mouth 3 (three) times daily.    spironolactone (ALDACTONE) 25 MG tablet Take 1 tablet by mouth daily.   tacrolimus (PROGRAF) 1 MG capsule Take 1 mg by mouth 2 (two) times daily.    zolpidem (AMBIEN) 10 MG tablet Take 10 mg by mouth at bedtime as needed.     Allergies:   Cefuroxime, Benadryl [diphenhydramine hcl], Ceftin, and Tape   Social History   Socioeconomic History   Marital status: Married    Spouse name: Not on file   Number of children: 1   Years of education: Not on file   Highest  education level: Not on file  Occupational History   Occupation: Corporate treasurer to hearing impaired    Employer: [employer]  Tobacco Use   Smoking status: Every Day    Years: 30.00    Types: Cigarettes    Last attempt to quit: 12/23/2015    Years since quitting: 6.0   Smokeless tobacco: Never  Vaping Use   Vaping Use: Never used  Substance and Sexual Activity   Alcohol use: Yes    Alcohol/week: 1.0 standard drink of alcohol    Types: 1 Glasses of wine per week    Comment: weekends occasional   Drug use: No   Sexual activity: Yes  Other Topics Concern   Not on file  Social History Narrative   Not on file   Social Determinants of Health   Financial Resource Strain: Not on file  Food Insecurity: Not on file  Transportation Needs: Not on file  Physical Activity: Not on file  Stress: Not on file  Social Connections: Not on file     Family History: The patient's family history includes Healthy in her brother; Hypertension in her mother; Other (age of onset: 34) in her father. Mother has atrial fibrillation s/p DCCV  ROS:   Please see the history of present illness.     All other systems reviewed and are negative.  EKGs/Labs/Other Studies Reviewed:    The following studies were reviewed today:   EKG:  EKG is  ordered today.  The ekg ordered today demonstrates   01/07/2022: bi-atrial enlargement, persistent inferolateral TWI  Recent Labs: 06/24/2021: ALT 19; BUN 27; Creatinine, Ser 1.67; Hemoglobin 15.4; Platelets 238.0; Potassium 5.1; Sodium 138   Recent Lipid Panel    Component Value Date/Time   CHOL 112 06/24/2021 1556   TRIG 268.0 (H) 06/24/2021 1556   HDL 38.90 (L) 06/24/2021 1556   CHOLHDL 3 06/24/2021 1556   VLDL 53.6 (H) 06/24/2021 1556   LDLCALC 57 01/25/2020 1137   LDLDIRECT 45.0 06/24/2021 1556     Risk Assessment/Calculations:      Physical Exam:    VS:  BP (!) 148/94 (BP Location: Right Arm, Patient Position: Sitting, Cuff Size:  Normal)   Pulse 63   Ht 5' 2.5" (1.588 m)   Wt 147 lb 9.6 oz (67 kg)   LMP 10/24/2012   SpO2 98%   BMI 26.57 kg/m     Wt Readings from Last 3 Encounters:  01/07/22 147 lb 9.6 oz (67 kg)  06/24/21 154 lb 12.8 oz (70.2 kg)  11/03/20 157 lb 12.8 oz (71.6 kg)     GEN:  Well nourished, well developed in no acute distress  HEENT: Normal NECK: No JVD; No carotid bruits LYMPHATICS: No lymphadenopathy CARDIAC: RRR, no murmurs, rubs, gallops RESPIRATORY:  Clear to auscultation without rales, wheezing or rhonchi  ABDOMEN: Soft, non-tender, non-distended MUSCULOSKELETAL:  No edema; No deformity  SKIN: Warm and dry NEUROLOGIC:  Alert and oriented x 3 PSYCHIATRIC:  Normal affect   ASSESSMENT:    Palpitations: She does not have high risk features including syncope c/f arrhythmia , family hx of SCD, or abnormalities on her EKG. Recommend smoking cessation  TWI: benign, this has been persistent over the years. Low suspicion for ischemia. No further cardiac w/u   PLAN:    In order of problems listed above:  Follow up as needed           Medication Adjustments/Labs and Tests Ordered: Current medicines are reviewed at length with the patient today.  Concerns regarding medicines are outlined above.  Orders Placed This Encounter  Procedures   EKG 12-Lead   No orders of the defined types were placed in this encounter.   Patient Instructions  Medication Instructions:  No Changes In Medications at this time.  *If you need a refill on your cardiac medications before your next appointment, please call your pharmacy*  Lab Work: None Ordered At This Time.  If you have labs (blood work) drawn today and your tests are completely normal, you will receive your results only by: [place] (if you have MyChart) OR A paper copy in the mail If you have any lab test that is abnormal or we need to change your treatment, we will call you to review the results.  Testing/Procedures: None  Ordered At This Time.   Follow-Up: At [facility], you and your health needs are our priority.  As part of our continuing mission to provide you with exceptional heart care, we have created designated Provider Care Teams.  These Care Teams include your primary Cardiologist (physician) and Advanced Practice Providers (APPs -  Physician Assistants and Nurse Practitioners) who all work together to provide you with the care you need, when you need it.  Your next appointment:   AS NEEDED   The format for your next appointment:   In Person  Provider:   Janina Mayo, MD           Signed, Janina Mayo, MD  01/07/2022 4:39 PM    [place]

## 2022-02-07 ENCOUNTER — Other Ambulatory Visit: Payer: Self-pay | Admitting: Family Medicine

## 2022-02-12 ENCOUNTER — Telehealth: Payer: Self-pay | Admitting: Family Medicine

## 2022-02-12 NOTE — Telephone Encounter (Signed)
Patient has changed care over to Dr. Welton Flakes.  States she had thought Dr. Jonni Sanger had changed Basaglar from 10 units to 12 units.  Patient states she needs to verify this for Dr. Mannie Stabile.

## 2022-02-15 NOTE — Telephone Encounter (Signed)
Message sent thru MyChart 

## 2022-09-20 ENCOUNTER — Other Ambulatory Visit: Payer: Self-pay | Admitting: Family Medicine

## 2022-09-20 DIAGNOSIS — E1169 Type 2 diabetes mellitus with other specified complication: Secondary | ICD-10-CM

## 2023-01-06 ENCOUNTER — Other Ambulatory Visit: Payer: Self-pay | Admitting: Nephrology

## 2023-01-06 ENCOUNTER — Encounter: Payer: Self-pay | Admitting: Nephrology

## 2023-01-06 DIAGNOSIS — Z940 Kidney transplant status: Secondary | ICD-10-CM

## 2023-01-11 ENCOUNTER — Emergency Department (HOSPITAL_COMMUNITY): Payer: BC Managed Care – PPO

## 2023-01-11 ENCOUNTER — Encounter (HOSPITAL_COMMUNITY): Payer: Self-pay | Admitting: Internal Medicine

## 2023-01-11 ENCOUNTER — Other Ambulatory Visit: Payer: Self-pay

## 2023-01-11 ENCOUNTER — Inpatient Hospital Stay (HOSPITAL_COMMUNITY)
Admission: EM | Admit: 2023-01-11 | Discharge: 2023-01-13 | DRG: 065 | Disposition: A | Discharge status: Home / Self Care | Payer: BC Managed Care – PPO | Attending: Internal Medicine | Admitting: Internal Medicine

## 2023-01-11 ENCOUNTER — Ambulatory Visit
Admission: EM | Admit: 2023-01-11 | Discharge: 2023-01-11 | Disposition: A | Discharge status: Home / Self Care | Payer: BC Managed Care – PPO | Attending: Internal Medicine | Admitting: Internal Medicine

## 2023-01-11 DIAGNOSIS — R499 Unspecified voice and resonance disorder: Secondary | ICD-10-CM

## 2023-01-11 DIAGNOSIS — I1310 Hypertensive heart and chronic kidney disease without heart failure, with stage 1 through stage 4 chronic kidney disease, or unspecified chronic kidney disease: Secondary | ICD-10-CM | POA: Diagnosis present

## 2023-01-11 DIAGNOSIS — Z7984 Long term (current) use of oral hypoglycemic drugs: Secondary | ICD-10-CM | POA: Diagnosis not present

## 2023-01-11 DIAGNOSIS — T8619 Other complication of kidney transplant: Secondary | ICD-10-CM | POA: Diagnosis present

## 2023-01-11 DIAGNOSIS — Z8249 Family history of ischemic heart disease and other diseases of the circulatory system: Secondary | ICD-10-CM

## 2023-01-11 DIAGNOSIS — E785 Hyperlipidemia, unspecified: Secondary | ICD-10-CM | POA: Diagnosis present

## 2023-01-11 DIAGNOSIS — Z79621 Long term (current) use of calcineurin inhibitor: Secondary | ICD-10-CM

## 2023-01-11 DIAGNOSIS — D849 Immunodeficiency, unspecified: Secondary | ICD-10-CM | POA: Diagnosis present

## 2023-01-11 DIAGNOSIS — I6789 Other cerebrovascular disease: Secondary | ICD-10-CM | POA: Diagnosis present

## 2023-01-11 DIAGNOSIS — R471 Dysarthria and anarthria: Secondary | ICD-10-CM | POA: Diagnosis present

## 2023-01-11 DIAGNOSIS — Z7985 Long-term (current) use of injectable non-insulin antidiabetic drugs: Secondary | ICD-10-CM | POA: Diagnosis not present

## 2023-01-11 DIAGNOSIS — E1122 Type 2 diabetes mellitus with diabetic chronic kidney disease: Secondary | ICD-10-CM | POA: Diagnosis present

## 2023-01-11 DIAGNOSIS — E1165 Type 2 diabetes mellitus with hyperglycemia: Secondary | ICD-10-CM | POA: Diagnosis present

## 2023-01-11 DIAGNOSIS — F17210 Nicotine dependence, cigarettes, uncomplicated: Secondary | ICD-10-CM | POA: Diagnosis present

## 2023-01-11 DIAGNOSIS — R29810 Facial weakness: Secondary | ICD-10-CM | POA: Diagnosis present

## 2023-01-11 DIAGNOSIS — Y830 Surgical operation with transplant of whole organ as the cause of abnormal reaction of the patient, or of later complication, without mention of misadventure at the time of the procedure: Secondary | ICD-10-CM | POA: Diagnosis present

## 2023-01-11 DIAGNOSIS — Z79624 Long term (current) use of inhibitors of nucleotide synthesis: Secondary | ICD-10-CM

## 2023-01-11 DIAGNOSIS — R1310 Dysphagia, unspecified: Secondary | ICD-10-CM | POA: Diagnosis present

## 2023-01-11 DIAGNOSIS — N184 Chronic kidney disease, stage 4 (severe): Secondary | ICD-10-CM | POA: Diagnosis present

## 2023-01-11 DIAGNOSIS — I639 Cerebral infarction, unspecified: Secondary | ICD-10-CM | POA: Diagnosis not present

## 2023-01-11 DIAGNOSIS — N179 Acute kidney failure, unspecified: Secondary | ICD-10-CM | POA: Diagnosis present

## 2023-01-11 DIAGNOSIS — R4701 Aphasia: Secondary | ICD-10-CM | POA: Diagnosis present

## 2023-01-11 DIAGNOSIS — Z91048 Other nonmedicinal substance allergy status: Secondary | ICD-10-CM

## 2023-01-11 DIAGNOSIS — Z794 Long term (current) use of insulin: Secondary | ICD-10-CM | POA: Diagnosis not present

## 2023-01-11 DIAGNOSIS — Z832 Family history of diseases of the blood and blood-forming organs and certain disorders involving the immune mechanism: Secondary | ICD-10-CM

## 2023-01-11 DIAGNOSIS — Z79899 Other long term (current) drug therapy: Secondary | ICD-10-CM | POA: Diagnosis not present

## 2023-01-11 DIAGNOSIS — Z7902 Long term (current) use of antithrombotics/antiplatelets: Secondary | ICD-10-CM | POA: Diagnosis not present

## 2023-01-11 DIAGNOSIS — I6389 Other cerebral infarction: Secondary | ICD-10-CM | POA: Diagnosis not present

## 2023-01-11 DIAGNOSIS — Z881 Allergy status to other antibiotic agents status: Secondary | ICD-10-CM

## 2023-01-11 DIAGNOSIS — F1210 Cannabis abuse, uncomplicated: Secondary | ICD-10-CM | POA: Diagnosis present

## 2023-01-11 DIAGNOSIS — I3139 Other pericardial effusion (noninflammatory): Secondary | ICD-10-CM | POA: Diagnosis present

## 2023-01-11 DIAGNOSIS — Z7982 Long term (current) use of aspirin: Secondary | ICD-10-CM

## 2023-01-11 DIAGNOSIS — E1169 Type 2 diabetes mellitus with other specified complication: Secondary | ICD-10-CM

## 2023-01-11 DIAGNOSIS — Z888 Allergy status to other drugs, medicaments and biological substances status: Secondary | ICD-10-CM

## 2023-01-11 LAB — COMPREHENSIVE METABOLIC PANEL
ALT: 21 U/L (ref 0–44)
AST: 20 U/L (ref 15–41)
Albumin: 3.4 g/dL — ABNORMAL LOW (ref 3.5–5.0)
Alkaline Phosphatase: 84 U/L (ref 38–126)
Anion gap: 12 (ref 5–15)
BUN: 30 mg/dL — ABNORMAL HIGH (ref 6–20)
CO2: 17 mmol/L — ABNORMAL LOW (ref 22–32)
Calcium: 8.5 mg/dL — ABNORMAL LOW (ref 8.9–10.3)
Chloride: 106 mmol/L (ref 98–111)
Creatinine, Ser: 2.11 mg/dL — ABNORMAL HIGH (ref 0.44–1.00)
GFR, Estimated: 27 mL/min — ABNORMAL LOW (ref >60)
Glucose, Bld: 91 mg/dL (ref 70–99)
Potassium: 4.1 mmol/L (ref 3.5–5.1)
Sodium: 135 mmol/L (ref 135–145)
Total Bilirubin: 0.8 mg/dL (ref 0.3–1.2)
Total Protein: 6.2 g/dL — ABNORMAL LOW (ref 6.5–8.1)

## 2023-01-11 LAB — APTT: aPTT: 27 seconds (ref 24–36)

## 2023-01-11 LAB — CBC
HCT: 49.5 % — ABNORMAL HIGH (ref 36.0–46.0)
Hemoglobin: 15.6 g/dL — ABNORMAL HIGH (ref 12.0–15.0)
MCH: 29.4 pg (ref 26.0–34.0)
MCHC: 31.5 g/dL (ref 30.0–36.0)
MCV: 93.4 fL (ref 80.0–100.0)
Platelets: 193 10*3/uL (ref 150–400)
RBC: 5.3 MIL/uL — ABNORMAL HIGH (ref 3.87–5.11)
RDW: 13.2 % (ref 11.5–15.5)
WBC: 10.7 10*3/uL — ABNORMAL HIGH (ref 4.0–10.5)
nRBC: 0 % (ref 0.0–0.2)

## 2023-01-11 LAB — DIFFERENTIAL
Abs Immature Granulocytes: 0.05 10*3/uL (ref 0.00–0.07)
Basophils Absolute: 0 10*3/uL (ref 0.0–0.1)
Basophils Relative: 0 %
Eosinophils Absolute: 0.2 10*3/uL (ref 0.0–0.5)
Eosinophils Relative: 1 %
Immature Granulocytes: 1 %
Lymphocytes Relative: 12 %
Lymphs Abs: 1.3 10*3/uL (ref 0.7–4.0)
Monocytes Absolute: 1.1 10*3/uL — ABNORMAL HIGH (ref 0.1–1.0)
Monocytes Relative: 11 %
Neutro Abs: 8 10*3/uL — ABNORMAL HIGH (ref 1.7–7.7)
Neutrophils Relative %: 75 %

## 2023-01-11 LAB — HEMOGLOBIN A1C
Hgb A1c MFr Bld: 7.5 % — ABNORMAL HIGH (ref 4.8–5.6)
Mean Plasma Glucose: 168.55 mg/dL

## 2023-01-11 LAB — CBG MONITORING, ED: Glucose-Capillary: 97 mg/dL (ref 70–99)

## 2023-01-11 LAB — URINALYSIS, ROUTINE W REFLEX MICROSCOPIC
Bacteria, UA: NONE SEEN
Bilirubin Urine: NEGATIVE
Glucose, UA: >=500 mg/dL — AB
Ketones, ur: NEGATIVE mg/dL
Leukocytes,Ua: NEGATIVE
Nitrite: NEGATIVE
Protein, ur: 100 mg/dL — AB
Specific Gravity, Urine: 1.006 (ref 1.005–1.030)
pH: 7 (ref 5.0–8.0)

## 2023-01-11 LAB — GLUCOSE, CAPILLARY
Glucose-Capillary: 172 mg/dL — ABNORMAL HIGH (ref 70–99)
Glucose-Capillary: 124 mg/dL — ABNORMAL HIGH (ref 70–99)

## 2023-01-11 LAB — RAPID URINE DRUG SCREEN, HOSP PERFORMED
Amphetamines: NOT DETECTED
Barbiturates: NOT DETECTED
Benzodiazepines: NOT DETECTED
Cocaine: NOT DETECTED
Opiates: NOT DETECTED
Tetrahydrocannabinol: POSITIVE — AB

## 2023-01-11 LAB — ETHANOL: Alcohol, Ethyl (B): <10 mg/dL (ref <10)

## 2023-01-11 LAB — PROTIME-INR
INR: 0.9 (ref 0.8–1.2)
Prothrombin Time: 12.3 seconds (ref 11.4–15.2)

## 2023-01-11 MED ORDER — INSULIN ASPART 100 UNIT/ML IJ SOLN
0.0000 [IU] | Freq: Three times a day (TID) | INTRAMUSCULAR | Status: DC
  Administered 2023-01-11 – 2023-01-12 (×2): 3 [IU] via SUBCUTANEOUS
  Administered 2023-01-12: 2 [IU] via SUBCUTANEOUS
  Filled 2023-01-11: qty 0.15

## 2023-01-11 MED ORDER — FOOD THICKENER (SIMPLYTHICK): 1.0000 | ORAL | Status: DC | PRN

## 2023-01-11 MED ORDER — ENOXAPARIN SODIUM 30 MG/0.3ML IJ SOSY
30.0000 mg | PREFILLED_SYRINGE | INTRAMUSCULAR | Status: DC
  Administered 2023-01-12 – 2023-01-13 (×2): 30 mg via SUBCUTANEOUS
  Filled 2023-01-11 (×2): qty 0.3

## 2023-01-11 MED ORDER — TACROLIMUS 1 MG PO CAPS
1.0000 mg | ORAL_CAPSULE | Freq: Two times a day (BID) | ORAL | Status: DC
  Administered 2023-01-11 – 2023-01-13 (×4): 1 mg via ORAL
  Filled 2023-01-11 (×4): qty 1

## 2023-01-11 MED ORDER — ROSUVASTATIN CALCIUM 20 MG PO TABS
40.0000 mg | ORAL_TABLET | Freq: Every day | ORAL | Status: DC
  Administered 2023-01-11 – 2023-01-12 (×2): 40 mg via ORAL
  Filled 2023-01-11 (×2): qty 2

## 2023-01-11 MED ORDER — ATORVASTATIN CALCIUM 40 MG PO TABS: 40.0000 mg | ORAL_TABLET | Freq: Every day | ORAL | Status: DC

## 2023-01-11 MED ORDER — MYCOPHENOLATE MOFETIL 250 MG PO CAPS
500.0000 mg | ORAL_CAPSULE | Freq: Two times a day (BID) | ORAL | Status: DC
  Administered 2023-01-12 – 2023-01-13 (×4): 500 mg via ORAL
  Filled 2023-01-11 (×4): qty 2

## 2023-01-11 MED ORDER — ORAL CARE MOUTH RINSE
15.0000 mL | OROMUCOSAL | Status: DC
  Administered 2023-01-11 – 2023-01-13 (×6): 15 mL via OROMUCOSAL

## 2023-01-11 MED ORDER — ONDANSETRON HCL 4 MG PO TABS: 4.0000 mg | ORAL_TABLET | Freq: Four times a day (QID) | ORAL | Status: DC | PRN

## 2023-01-11 MED ORDER — MYCOPHENOLATE MOFETIL 500 MG PO TABS
500.0000 mg | ORAL_TABLET | Freq: Two times a day (BID) | ORAL | Status: DC
  Filled 2023-01-11 (×3): qty 1

## 2023-01-11 MED ORDER — ACETAMINOPHEN 325 MG PO TABS: 650.0000 mg | ORAL_TABLET | Freq: Four times a day (QID) | ORAL | Status: DC | PRN

## 2023-01-11 MED ORDER — SODIUM CHLORIDE 0.9 % IV SOLN
100.0000 mL/h | INTRAVENOUS | Status: DC
  Administered 2023-01-11: 100 mL/h via INTRAVENOUS

## 2023-01-11 MED ORDER — SODIUM BICARBONATE 650 MG PO TABS
1300.0000 mg | ORAL_TABLET | Freq: Three times a day (TID) | ORAL | Status: DC
  Administered 2023-01-11 – 2023-01-13 (×5): 1300 mg via ORAL
  Filled 2023-01-11 (×5): qty 2

## 2023-01-11 MED ORDER — SODIUM CHLORIDE 0.9 % IV BOLUS
500.0000 mL | Freq: Once | INTRAVENOUS | Status: AC
  Administered 2023-01-11: 500 mL via INTRAVENOUS

## 2023-01-11 MED ORDER — STROKE: EARLY STAGES OF RECOVERY BOOK
Freq: Once | Status: AC
  Filled 2023-01-11: qty 1

## 2023-01-11 MED ORDER — ORAL CARE MOUTH RINSE: 15.0000 mL | OROMUCOSAL | Status: DC | PRN

## 2023-01-11 MED ORDER — INSULIN GLARGINE-YFGN 100 UNIT/ML ~~LOC~~ SOLN
10.0000 [IU] | Freq: Every day | SUBCUTANEOUS | Status: DC
  Administered 2023-01-11 – 2023-01-12 (×2): 10 [IU] via SUBCUTANEOUS
  Filled 2023-01-11 (×4): qty 0.1

## 2023-01-11 MED ORDER — ASPIRIN 81 MG PO TBEC
81.0000 mg | DELAYED_RELEASE_TABLET | Freq: Every day | ORAL | Status: DC
  Administered 2023-01-11 – 2023-01-12 (×2): 81 mg via ORAL
  Filled 2023-01-11 (×2): qty 1

## 2023-01-11 MED ORDER — ACETAMINOPHEN 650 MG RE SUPP: 650.0000 mg | Freq: Four times a day (QID) | RECTAL | Status: DC | PRN

## 2023-01-11 MED ORDER — ALBUTEROL SULFATE (2.5 MG/3ML) 0.083% IN NEBU: 2.5000 mg | INHALATION_SOLUTION | RESPIRATORY_TRACT | Status: DC | PRN

## 2023-01-11 MED ORDER — PREDNISONE 5 MG PO TABS
5.0000 mg | ORAL_TABLET | Freq: Every day | ORAL | Status: DC
  Administered 2023-01-12 – 2023-01-13 (×2): 5 mg via ORAL
  Filled 2023-01-11 (×2): qty 1

## 2023-01-11 MED ORDER — INSULIN ASPART 100 UNIT/ML IJ SOLN
0.0000 [IU] | Freq: Every day | INTRAMUSCULAR | Status: DC
  Filled 2023-01-11: qty 0.05

## 2023-01-11 MED ORDER — ZOLPIDEM TARTRATE 5 MG PO TABS
5.0000 mg | ORAL_TABLET | Freq: Every evening | ORAL | Status: DC | PRN
  Administered 2023-01-12: 5 mg via ORAL
  Filled 2023-01-11: qty 1

## 2023-01-11 MED ORDER — ESCITALOPRAM OXALATE 10 MG PO TABS
10.0000 mg | ORAL_TABLET | Freq: Every day | ORAL | Status: DC
  Administered 2023-01-11 – 2023-01-12 (×2): 10 mg via ORAL
  Filled 2023-01-11 (×2): qty 1

## 2023-01-11 MED ORDER — CLOPIDOGREL BISULFATE 75 MG PO TABS
75.0000 mg | ORAL_TABLET | Freq: Every day | ORAL | Status: DC
  Administered 2023-01-11 – 2023-01-13 (×3): 75 mg via ORAL
  Filled 2023-01-11 (×4): qty 1

## 2023-01-11 MED ORDER — ONDANSETRON HCL 4 MG/2ML IJ SOLN
4.0000 mg | Freq: Four times a day (QID) | INTRAMUSCULAR | Status: DC | PRN
  Administered 2023-01-11 – 2023-01-13 (×2): 4 mg via INTRAVENOUS
  Filled 2023-01-11 (×2): qty 2

## 2023-01-11 MED ORDER — ENOXAPARIN SODIUM 40 MG/0.4ML IJ SOSY: 40.0000 mg | PREFILLED_SYRINGE | INTRAMUSCULAR | Status: DC

## 2023-01-11 NOTE — ED Triage Notes (Signed)
Pt reports slurred speech since 2030 last night. No other symptoms reported

## 2023-01-11 NOTE — ED Notes (Signed)
Patient is being discharged from the Urgent Care and sent to the Emergency Department via friend's POV . Per Cheri Rous, NP, patient is in need of higher level of care due to slurred speech. Patient is aware and verbalizes understanding of plan of care.  Vitals:   01/11/23 0830  BP: (!) 168/123  Pulse: 81  Resp: 16  Temp: 98.3 F (36.8 C)  SpO2: 96%

## 2023-01-11 NOTE — ED Notes (Signed)
Pt did not pass swallow screen...was able to drink most of the water, but ended up choking slightly and spit up water Pt advised this is not normal for her

## 2023-01-11 NOTE — Progress Notes (Signed)
Patients Blood pressure 178/99 reported to MD Kirby Crigler to notify of hypertension and to obtain clarification on systolic blood pressure goals for patient.  MD Kirby Crigler advised goal is under 220 unless neurology says otherwise.

## 2023-01-11 NOTE — ED Provider Notes (Signed)
[facility]    CSN: [id number] Arrival date & time: 01/11/23  7829      History   Chief Complaint No chief complaint on file.   HPI Isabella Obrien is a 57 y.o. female presents for evaluation of possible stroke.  Patient has a past medical history of chronic kidney disease status post kidney transplant, diabetes, hyperlipidemia, hypertension.  She reports last night her husband made her smoothie and after that she felt as though she had cotton in her mouth and felt as though she had to concentrate more on swallowing and pronouncing her words.  denies any new fruits or ingredients in the smoothie that she has not had in the past.  States she went to bed and when she woke she thought things would be better but her husband felt as though she was slurring her words and her coworkers felt as though her face may be drooping prompting them to bring her in for evaluation.  Patient denies any unilateral weakness, tongue/lip/throat swelling, chest pain, shortness of breath, difficulty breathing, headache, dizziness, visual changes, sore throat, URI symptoms.  She does state it feels like there is cotton in her mouth and her voice "sounds funny" but denies any other symptoms.  She still reports having to concentrate to pronounce her words but denies difficulty finding her words.  She states she otherwise feels fine.  Denies history of stroke or any family history of stroke.  Only new medication is Ozempic.  She is not on blood thinning medications.  HPI  Past Medical History:  Diagnosis Date   Chronic kidney disease    has kidney transplant - having aphoresis now every other week   Diabetes mellitus without complication (HCC)    dm type 2 - fasting 150   History of blood product transfusion    Hyperlipidemia    Hypertension    Menopausal hot flushes 09/06/2017   Controlled on Lexapro by Dr. 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      Home Medications    Prior to Admission medications   Medication Sig Start Date End Date Taking? Authorizing Provider  Accu-Chek FastClix Lancets MISC USE AS DIRECTED UP TO FOUR TIMES DAILY 04/09/21   Willow Ora, MD  ACCU-CHEK GUIDE test strip USE TO TEST AS DIRECTED UP TO 4 TIMES DAILY 03/25/20   Willow Ora, MD  albuterol (PROVENTIL) (2.5 MG/3ML) 0.083% nebulizer solution Take 3 mLs (2.5 mg total) by nebulization 2 (two) times daily as needed for wheezing or shortness of breath. 01/29/21   Willow Ora, MD  albuterol (VENTOLIN HFA) 108 (90 Base) MCG/ACT  inhaler Inhale 2 puffs into the lungs every 6 (six) hours as needed for wheezing or shortness of breath. 11/03/20   Willow Ora, MD  aspirin EC 81 MG tablet Take 81 mg by mouth daily.    [provider]  atenolol (TENORMIN) 100 MG tablet Take 100 mg by mouth daily.    [provider]  atorvastatin (LIPITOR) 40 MG tablet TAKE 1 TABLET(40 MG) BY MOUTH AT BEDTIME 09/20/22   Willow Ora, MD  benazepril (LOTENSIN) 40 MG tablet Take 40 mg by mouth 2 (two) times daily.    [provider]  blood glucose  meter kit and supplies KIT Dispense based on patient and insurance preference. Use up to four times daily as directed. 08/22/19   Willow Ora, MD  dapagliflozin propanediol (FARXIGA) 10 MG TABS tablet Take 10 mg by mouth daily. 12/25/19   [provider]  furosemide (LASIX) 40 MG tablet Take 1 tablet (40 mg total) by mouth daily as needed. 06/24/21   Willow Ora, MD  Insulin Glargine [facility]) 100 UNIT/ML Inject 10 Units into the skin at bedtime. Patient taking differently: Inject 12 Units into the skin at bedtime. 06/24/21 01/07/22  Willow Ora, MD  Insulin Pen Needle 32G X 4 MM MISC 50 each by Does not apply route in the morning and at bedtime. 07/25/19   Willow Ora, MD  ipratropium (ATROVENT) 0.02 % nebulizer solution Take 2.5 mLs (0.5 mg total) by nebulization 2 (two) times daily as needed for wheezing or shortness of breath. 01/29/21   Willow Ora, MD  mycophenolate (CELLCEPT) 500 MG tablet Take by mouth 2 (two) times daily.    [provider]  Omega-3 Fatty Acids (FISH OIL) 1000 MG CAPS Take 2 capsules by mouth daily. 02/13/14   [provider]  OZEMPIC, 0.25 OR 0.5 MG/DOSE, 2 MG/3ML SOPN INJECT 0.5MG  UNDER THE SKIN ONCE WEEKLY    [provider]  predniSONE (DELTASONE) 5 MG tablet Take 5 mg by mouth daily with breakfast.    [provider]  sitaGLIPtin-metformin (JANUMET) 50-1000 MG tablet TAKE 1 TABLET BY MOUTH EVERY 12 HOURS 02/08/22   Willow Ora, MD  sodium bicarbonate 325 MG tablet Take 650 mg by mouth 3 (three) times daily.     [provider]  spironolactone (ALDACTONE) 25 MG tablet Take 1 tablet by mouth daily. 08/28/20   [provider]  tacrolimus (PROGRAF) 1 MG capsule Take 1 mg by mouth 2 (two) times daily.     [provider]  zolpidem (AMBIEN) 10 MG tablet Take 10 mg by mouth at bedtime as needed. 12/06/21   [provider]    Family History Family History  Problem Relation Age  of Onset   Hypertension Mother    Other Father 15       amyloidosis   Healthy Brother     Social History Social History   Tobacco Use   Smoking status: Every Day    Current packs/day: 0.00    Types: Cigarettes    Start date: 12/22/1985    Last attempt to quit: 12/23/2015    Years since quitting: 7.0   Smokeless tobacco: Never  Vaping Use   Vaping status: Never Used  Substance Use Topics   Alcohol use: Yes    Alcohol/week: 1.0 standard drink of alcohol    Types: 1 Glasses of wine per week    Comment: weekends occasional   Drug use: No  Allergies   Cefuroxime, Benadryl [diphenhydramine hcl], Ceftin, and Tape   Review of Systems Review of Systems  HENT:  Positive for voice change.      Physical Exam Triage Vital Signs ED Triage Vitals  Encounter Vitals Group     BP 01/11/23 0830 (!) 168/123     Systolic BP Percentile --      Diastolic BP Percentile --      Pulse Rate 01/11/23 0830 81     Resp 01/11/23 0830 16     Temp 01/11/23 0830 98.3 F (36.8 C)     Temp Source 01/11/23 0830 Oral     SpO2 01/11/23 0830 96 %     Weight --      Height --      Head Circumference --      Peak Flow --      Pain Score 01/11/23 0833 0     Pain Loc --      Pain Education --      Exclude from Growth Chart --    No data found.  Updated Vital Signs BP (!) 168/123 (BP Location: Left Arm)   Pulse 81   Temp 98.3 F (36.8 C) (Oral)   Resp 16   LMP 10/24/2012   SpO2 96%   Visual Acuity Right Eye Distance:   Left Eye Distance:   Bilateral Distance:    Right Eye Near:   Left Eye Near:    Bilateral Near:     Physical Exam Vitals and nursing note reviewed.  Constitutional:      General: She is not in acute distress.    Appearance: She is not ill-appearing, toxic-appearing or diaphoretic.  HENT:     Head: Normocephalic and atraumatic.     Mouth/Throat:     Lips: Pink.     Mouth: Mucous membranes are moist. No angioedema.     Pharynx: Oropharynx is clear. Uvula  midline. No pharyngeal swelling or uvula swelling.  Eyes:     Extraocular Movements: Extraocular movements intact.     Conjunctiva/sclera: Conjunctivae normal.     Pupils: Pupils are equal, round, and reactive to light.  Cardiovascular:     Rate and Rhythm: Normal rate and regular rhythm.     Heart sounds: Normal heart sounds.  Pulmonary:     Effort: Pulmonary effort is normal.     Breath sounds: Normal breath sounds.  Skin:    General: Skin is warm and dry.  Neurological:     General: No focal deficit present.     Mental Status: She is alert and oriented to person, place, and time.     GCS: GCS eye subscore is 4. GCS verbal subscore is 5. GCS motor subscore is 6.     Cranial Nerves: No facial asymmetry.     Motor: No weakness or pronator drift.     Coordination: Romberg sign negative. Coordination normal. Finger-Nose-Finger Test normal.     Gait: Gait normal.  Psychiatric:        Mood and Affect: Mood normal.        Behavior: Behavior normal.      UC Treatments / Results  Labs (all labs ordered are listed, but only abnormal results are displayed) Labs Reviewed - No data to display  EKG   Radiology No results found.  Procedures Procedures (including critical care time)  Medications Ordered in UC Medications - No data to display  Initial Impression / Assessment and Plan / UC Course  I have  reviewed the triage vital signs and the nursing notes.  Pertinent labs & imaging results that were available during my care of the patient were reviewed by me and considered in my medical decision making (see chart for details).     I reviewed exam and symptoms with patient.  Discussed limitations and abilities of urgent care.  Patient presenting with possible stroke.  Per patient only symptom is feeling her voice is different and having to concentrate more to pronounce her words but denies unilateral weakness, throat or tongue swelling, headache, dizziness, visual changes.   Negative FAST exam.  BP is elevated at intake but she denies chest pain or shortness of breath.  Discussed with patient that I am unable to rule out any life-threatening causes of her symptoms including stroke and as this is her primary concern I advise she go to the emergency room for further evaluation.  She declined EMS transfer and will have her coworkers drive her.  Discussed risks of going POV including permanent disability and/or death and she verbalized understanding.  She was instructed to pull over and call 911 for any worsening symptoms that occur in transit and she verbalized understanding. Final Clinical Impressions(s) / UC Diagnoses   Final diagnoses:  Change in voice     Discharge Instructions      Patient sent to the emergency room for further evaluation of her concerns     ED Prescriptions   None    PDMP not reviewed this encounter.   Radford Pax, NP 01/11/23 [phone]

## 2023-01-11 NOTE — H&P (Signed)
History and Physical  Isabella Obrien MWU:[id number] DOB: 02-Jul-1965 DOA: 01/11/2023  PCP: Aliene Beams, MD   Chief Complaint: Speech change, trouble swallowing  HPI: Isabella Obrien is a 57 y.o. female with medical history significant for hypertension, hyperlipidemia, insulin-dependent type 2 diabetes, status post kidney transplant for FSGS being admitted to the hospital with acute CVA.  She is compliant with her medications, does not check her blood pressure frequently, says that when she comes to the office it is slightly on the higher side.  She has been in her usual state of health until last evening at about 8:20 PM, when she was having a smoothie with her husband, felt like the left side of her face was a little bit numb, felt awkward to swallow, and husband noticed that her speech was a little bit slurred.  He feels like it has improved a little bit overnight, but is still not completely normal.  She denies any double vision, confusion, extremity numbness tingling or weakness.  States that through the night, she could tell that she needed to concentrate a little bit extra in order to swallow, but was not choking.  ED Course: On evaluation here in the emergency department, she has been afebrile, blood pressure little bit on the high side but otherwise vital signs are unremarkable.  Lab work shows mild AKI creatinine 2.11, otherwise relatively unremarkable.  MRI of the brain was done, which as detailed below shows evidence of acute infarction in the left frontal white matter.  Hospitalist was contacted for admission, ER provider discussed with neurology who recommended admission to [facility] for formal neurology consultation.  Review of Systems: Please see HPI for pertinent positives and negatives. A complete 10 system review of systems are otherwise negative.  Past Medical History:  Diagnosis Date   Chronic kidney disease    has kidney transplant - having aphoresis now every other week    Diabetes mellitus without complication (HCC)    dm type 2 - fasting 150   History of blood product transfusion    Hyperlipidemia    Hypertension    Menopausal hot flushes 09/06/2017   Controlled on Lexapro by Dr. Rana Snare   Neuromuscular disorder [facility])    Past Surgical History:  Procedure Laterality Date   COLONOSCOPY     diateck catheter placement  09/2013   KIDNEY TRANSPLANT     ORIF ANKLE FRACTURE Left 2014    Social History:  reports that she has been smoking cigarettes. She started smoking about 37 years ago. She has never used smokeless tobacco. She reports current alcohol use of about 1.0 standard drink of alcohol per week. She reports that she does not use drugs.   Allergies  Allergen Reactions   Cefuroxime Swelling   Benadryl [Diphenhydramine Hcl] Other (See Comments)    REACTION: anxiety, hyperactivity   Ceftin Swelling   Tape Rash and Other (See Comments)    Paper tape ok. Paper tape ok.    Family History  Problem Relation Age of Onset   Hypertension Mother    Other Father 45       amyloidosis   Healthy Brother      Prior to Admission medications   Medication Sig Start Date End Date Taking? Authorizing Provider  Accu-Chek FastClix Lancets MISC USE AS DIRECTED UP TO FOUR TIMES DAILY 04/09/21   Willow Ora, MD  ACCU-CHEK GUIDE test strip USE TO TEST AS DIRECTED UP TO 4 TIMES DAILY 03/25/20   Willow Ora,  MD  albuterol (PROVENTIL) (2.5 MG/3ML) 0.083% nebulizer solution Take 3 mLs (2.5 mg total) by nebulization 2 (two) times daily as needed for wheezing or shortness of breath. 01/29/21   Willow Ora, MD  albuterol (VENTOLIN HFA) 108 (90 Base) MCG/ACT inhaler Inhale 2 puffs into the lungs every 6 (six) hours as needed for wheezing or shortness of breath. 11/03/20   Willow Ora, MD  aspirin EC 81 MG tablet Take 81 mg by mouth daily.    [provider]  atenolol (TENORMIN) 100 MG tablet Take 100 mg by mouth daily.    [provider]   atorvastatin (LIPITOR) 40 MG tablet TAKE 1 TABLET(40 MG) BY MOUTH AT BEDTIME 09/20/22   Willow Ora, MD  benazepril (LOTENSIN) 40 MG tablet Take 40 mg by mouth 2 (two) times daily.    [provider]  blood glucose meter kit and supplies KIT Dispense based on patient and insurance preference. Use up to four times daily as directed. 08/22/19   Willow Ora, MD  dapagliflozin propanediol (FARXIGA) 10 MG TABS tablet Take 10 mg by mouth daily. 12/25/19   [provider]  furosemide (LASIX) 40 MG tablet Take 1 tablet (40 mg total) by mouth daily as needed. 06/24/21   Willow Ora, MD  Insulin Glargine [facility]) 100 UNIT/ML Inject 10 Units into the skin at bedtime. Patient taking differently: Inject 12 Units into the skin at bedtime. 06/24/21 01/07/22  Willow Ora, MD  Insulin Pen Needle 32G X 4 MM MISC 50 each by Does not apply route in the morning and at bedtime. 07/25/19   Willow Ora, MD  ipratropium (ATROVENT) 0.02 % nebulizer solution Take 2.5 mLs (0.5 mg total) by nebulization 2 (two) times daily as needed for wheezing or shortness of breath. 01/29/21   Willow Ora, MD  mycophenolate (CELLCEPT) 500 MG tablet Take by mouth 2 (two) times daily.    [provider]  Omega-3 Fatty Acids (FISH OIL) 1000 MG CAPS Take 2 capsules by mouth daily. 02/13/14   [provider]  OZEMPIC, 0.25 OR 0.5 MG/DOSE, 2 MG/3ML SOPN INJECT 0.5MG  UNDER THE SKIN ONCE WEEKLY    [provider]  predniSONE (DELTASONE) 5 MG tablet Take 5 mg by mouth daily with breakfast.    [provider]  sitaGLIPtin-metformin (JANUMET) 50-1000 MG tablet TAKE 1 TABLET BY MOUTH EVERY 12 HOURS 02/08/22   Willow Ora, MD  sodium bicarbonate 325 MG tablet Take 650 mg by mouth 3 (three) times daily.     [provider]  spironolactone (ALDACTONE) 25 MG tablet Take 1 tablet by mouth daily. 08/28/20   [provider]  tacrolimus (PROGRAF) 1 MG capsule Take 1 mg  by mouth 2 (two) times daily.     [provider]  zolpidem (AMBIEN) 10 MG tablet Take 10 mg by mouth at bedtime as needed. 12/06/21   [provider]    Physical Exam: BP (!) 153/120 Comment: nurse notified  Pulse 68   Temp 98.5 F (36.9 C) (Oral)   Resp 20   Ht 5\' 2"  (1.575 m)   Wt 67 kg   LMP 10/24/2012   SpO2 96%   BMI 27.02 kg/m   General:  Alert, oriented, calm, in no acute distress  Eyes: EOMI, clear conjuctivae, white sclerea Neck: supple, no masses, trachea mildline  Cardiovascular: RRR, no murmurs or rubs, no peripheral edema  Respiratory: clear to auscultation bilaterally, no wheezes, no  crackles  Abdomen: soft, nontender, nondistended, normal bowel tones heard  Skin: dry, no rashes  Musculoskeletal: no joint effusions, normal range of motion  Psychiatric: appropriate affect, normal speech  Neurologic: extraocular muscles intact, clear speech but per husband slightly slurred, moving all extremities with intact sensorium          Labs on Admission:  Basic Metabolic Panel: Recent Labs  Lab 01/11/23 1000  NA 135  K 4.1  CL 106  CO2 17*  GLUCOSE 91  BUN 30*  CREATININE 2.11*  CALCIUM 8.5*   Liver Function Tests: Recent Labs  Lab 01/11/23 1000  AST 20  ALT 21  ALKPHOS 84  BILITOT 0.8  PROT 6.2*  ALBUMIN 3.4*   No results for input(s): "LIPASE", "AMYLASE" in the last 168 hours. No results for input(s): "AMMONIA" in the last 168 hours. CBC: Recent Labs  Lab 01/11/23 1000  WBC 10.7*  NEUTROABS 8.0*  HGB 15.6*  HCT 49.5*  MCV 93.4  PLT 193   Cardiac Enzymes: No results for input(s): "CKTOTAL", "CKMB", "CKMBINDEX", "TROPONINI" in the last 168 hours.  BNP (last 3 results) No results for input(s): "BNP" in the last 8760 hours.  ProBNP (last 3 results) No results for input(s): "PROBNP" in the last 8760 hours.  CBG: Recent Labs  Lab 01/11/23 0858  GLUCAP 97    Radiological Exams on Admission: MR BRAIN WO  CONTRAST  Result Date: 01/11/2023 CLINICAL DATA:  Neuro deficit, acute, stroke suspected aphasia x 13 hours EXAM: MRI HEAD WITHOUT CONTRAST TECHNIQUE: Multiplanar, multiecho pulse sequences of the brain and surrounding structures were obtained without intravenous contrast. COMPARISON:  None Available. FINDINGS: Brain: Acute infarct in the left frontal white matter. Minimal associated edema without mass effect. No evidence of acute hemorrhage, mass lesion, midline shift or hydrocephalus. Vascular: Diminished left proximal MCA flow void. Other major arterial flow voids are maintained Skull and upper cervical spine: Normal marrow signal. Sinuses/Orbits: Clear sinuses.  No acute orbital findings. Other: No mastoid effusions. IMPRESSION: 1. Acute infarct in the left frontal white matter. 2. Diminished left proximal MCA flow void. This could be due to technique or secondary to significant stenosis. Recommend CTA or MRA to further evaluate. Electronically Signed   By: Feliberto Harts M.D.   On: 01/11/2023 11:53    Assessment/Plan Isabella Obrien is a 57 y.o. female with medical history significant for hypertension, hyperlipidemia, insulin-dependent type 2 diabetes, status post kidney transplant for FSGS being admitted to the hospital with acute CVA.    Acute left frontal infarct -Inpatient admission with medical telemetry -Failed nurse bedside swallow screen, keep n.p.o. until SLP evaluation -PT/OT -Hold antihypertensives for now (took them at home this morning) -Check hemoglobin A1c, fasting lipids -2D echo -CTA head and neck in the morning, as renal function hopefully improves -Anticipate formal neurology consultation upon transfer to [facility] -Start aspirin and Plavix  Acute on chronic renal failure-baseline creatinine roughly 1.5 -Hold nephrotoxins -Gentle IV fluid  History of related donor renal transplantation due to FSGS -Continue antirejection medications once confirmed  Hypertension-hold  further doses of home antihypertensive medication  Hypelipidemia-continue Lipitor, check fasting lipids in the morning  Type 2 diabetes -Carb controlled diet when eating -Continue basal Lantus, will reduce to 5 units nightly while n.p.o. -Moderate dose sliding scale  DVT prophylaxis: Lovenox     Code Status: Full Code  Consults called: Neurology  Admission status: The appropriate patient status for this patient is INPATIENT. Inpatient status is judged to be reasonable and  necessary in order to provide the required intensity of service to ensure the patient's safety. The patient's presenting symptoms, physical exam findings, and initial radiographic and laboratory data in the context of their chronic comorbidities is felt to place them at high risk for further clinical deterioration. Furthermore, it is not anticipated that the patient will be medically stable for discharge from the hospital within 2 midnights of admission.    I certify that at the point of admission it is my clinical judgment that the patient will require inpatient hospital care spanning beyond 2 midnights from the point of admission due to high intensity of service, high risk for further deterioration and high frequency of surveillance required  Time spent: 59 minutes  Shatima Zalar Sharlette Dense MD [facility] Pager [phone]  If 7PM-7AM, please contact night-coverage [email or web address] Password [email or web address]  01/11/2023, 1:16 PM

## 2023-01-11 NOTE — Discharge Instructions (Addendum)
Patient sent to the emergency room for further evaluation of her concerns

## 2023-01-11 NOTE — ED Notes (Signed)
Report given to [facility] nurse and [facility] contacted as well

## 2023-01-11 NOTE — Evaluation (Signed)
Clinical/Bedside Swallow Evaluation Patient Details  Name: Isabella Obrien MRN: [id number] Date of Birth: January 07, 1966  Today's Date: 01/11/2023 Time: SLP Start Time (ACUTE ONLY): 1539 SLP Stop Time (ACUTE ONLY): 1620 SLP Time Calculation (min) (ACUTE ONLY): 41 min  Past Medical History:  Past Medical History:  Diagnosis Date   Chronic kidney disease    has kidney transplant - having aphoresis now every other week   Diabetes mellitus without complication (HCC)    dm type 2 - fasting 150   History of blood product transfusion    Hyperlipidemia    Hypertension    Menopausal hot flushes 09/06/2017   Controlled on Lexapro by Dr. Rana Snare   Neuromuscular disorder [facility])    Past Surgical History:  Past Surgical History:  Procedure Laterality Date   COLONOSCOPY     diateck catheter placement  09/2013   KIDNEY TRANSPLANT     ORIF ANKLE FRACTURE Left 2014   HPI:  Isabella Obrien is a 57 y.o. female adm to [facility] with dysarthria, and dysphagia. 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  Swallow and speech eval ordered.  Pt with PMH + for smoking, renal disease s/p renal transplant, "neuromuscular disorder".    Assessment / Plan / Recommendation  Clinical Impression  Patient presents with clinical indications of dysphagia - suspect oral more than pharyngeal - likely premature spillage into larynx prior to swallow trigger. Patient with overt cough with liquids approx 75% of opportunities, chin tuck mitigated this coughing but did not fully prevent it.  No issues noted with swallowing slightly thicker liquids, applesauce and solids *full sandwich*.   Recommend regular/nectar liquids, allowing thin water between meals.  Will follow up for dysphagia and speech/language evaluation.  Pt appears with dysarthria but no language deficits. SLP Visit Diagnosis: Dysphagia, oral phase (R13.11)    Aspiration Risk  Mild aspiration risk    Diet Recommendation Regular;Nectar-thick liquid (water Frazier protocol)     Liquid Administration via: Straw;Cup Medication Administration: Whole meds with puree Supervision: Patient able to self feed Compensations: Slow rate;Small sips/bites Postural Changes: Seated upright at 90 degrees;Remain upright for at least 30 minutes after po intake    Other  Recommendations Oral Care Recommendations: Oral care BID    Recommendations for follow up therapy are one component of a multi-disciplinary discharge planning process, led by the attending physician.  Recommendations may be updated based on patient status, additional functional criteria and insurance authorization.  Follow up Recommendations Outpatient SLP      Assistance Recommended at Discharge  TBD  Functional Status Assessment Patient has had a recent decline in their functional status and demonstrates the ability to make significant improvements in function in a reasonable and predictable amount of time.  Frequency and Duration min 1 x/week  1 week       Prognosis Prognosis for improved oropharyngeal function: Good      Swallow Study   General Date of Onset: 01/11/23 HPI: Isabella Obrien is a 57 y.o. female adm to [facility] with dysarthria, and dysphagia. 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  Swallow and speech eval ordered.  Pt with PMH + for smoking, renal disease s/p renal transplant, "neuromuscular disorder". Type of Study: Bedside Swallow Evaluation Diet Prior to this Study: NPO Temperature Spikes Noted: No Respiratory Status: Room air History of Recent Intubation: No Behavior/Cognition: Alert;Cooperative;Pleasant mood Oral Cavity Assessment: Within Functional Limits Oral Care Completed by SLP: No Oral Cavity - Dentition: Adequate natural dentition Vision: Functional for  self-feeding Self-Feeding Abilities: Able to feed self Patient Positioning: Upright in bed Baseline Vocal Quality: Low vocal intensity Volitional Cough: Strong Volitional Swallow: Able to elicit    Oral/Motor/Sensory  Function Overall Oral Motor/Sensory Function: Mild impairment Facial ROM: Reduced right Facial Symmetry: Abnormal symmetry right Facial Strength: Reduced right Facial Sensation: Reduced right;Suspected CN V (Trigeminal) dysfunction Lingual ROM: Reduced right Lingual Symmetry: Within Functional Limits Lingual Strength: Reduced Lingual Sensation: Within Functional Limits Velum: Within Functional Limits Mandible: Within Functional Limits   Ice Chips Ice chips: Within functional limits   Thin Liquid Thin Liquid: Impaired Presentation: Self Fed;Cup;Straw;Spoon Pharyngeal  Phase Impairments: Cough - Immediate    Nectar Thick Nectar Thick Liquid: Within functional limits Presentation: Straw   Honey Thick Honey Thick Liquid: Not tested   Puree Puree: Within functional limits Presentation: Self Fed;Spoon   Solid     Solid: Within functional limits Presentation: Self Orvan July 01/11/2023,4:45 PM  Rolena Infante, MS [facility] SLP Acute Rehab Services Office [phone]

## 2023-01-11 NOTE — ED Triage Notes (Signed)
Pt presents to UC w/ c/o numbness in face, tongue, difficulty swallowing since last night.

## 2023-01-11 NOTE — Consult Note (Signed)
Neurology Consultation  Reason for Consult: Stroke Referring Physician: Dr. Jeraldine Loots  CC: Speech abnormality  History is obtained from: Chart, patient  HPI: Isabella Obrien is a 57 y.o. female past medical history of CKD, recipient of kidney transplant, diabetes, hypertension, hyperlipidemia, tobacco abuse, presented to the emergency department for evaluation of speech difficulty with a last known well somewhere around 8 PM on 01/10/2023.  She said that she had a smoothie for dinner that her husband made which was very cold.  She felt that her mouth was frozen from the smoothie and her speech was somewhat different.  Her husband felt that her speech was off and was concern for strokelike symptoms and asked her to come to the emergency department but she did not think she was having a stroke and did not come in for evaluation until this morning when the symptoms still were somewhat present but had already started to improve.  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  The husband also felt that she had a facial droop on the right side, which has since become better/resolved. No prior history of strokes.  On an aspirin every day at home.   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   Past Medical History:  Diagnosis Date   Chronic kidney disease    has kidney transplant - having aphoresis now every other week   Diabetes mellitus without complication (HCC)    dm type 2 - fasting 150   History of blood product transfusion    Hyperlipidemia    Hypertension    Menopausal hot flushes 09/06/2017   Controlled on Lexapro by Dr. Rana Snare   Neuromuscular disorder [facility])     Family History  Problem Relation Age of Onset   Hypertension Mother     Other Father 69       amyloidosis   Healthy Brother     Social History:   reports that she has been smoking cigarettes. She started smoking about 37 years ago. She has never used smokeless tobacco. She reports current alcohol use of about 1.0 standard drink of alcohol per week. She reports that she does not use drugs.  Medications  Current Facility-Administered Medications:    [START ON 01/12/2023]  stroke: early stages of recovery book, , Does not apply, Once, Kirby Crigler, Mir M, MD   acetaminophen (TYLENOL) tablet 650 mg, 650 mg, Oral, Q6H PRN **OR** acetaminophen (TYLENOL) suppository 650 mg, 650 mg, Rectal, Q6H PRN, Kirby Crigler, Mir M, MD   albuterol (PROVENTIL) (2.5 MG/3ML) 0.083% nebulizer solution 2.5 mg, 2.5 mg, Nebulization, Q2H PRN, Kirby Crigler, Mir M, MD   aspirin EC tablet 81 mg, 81 mg, Oral, Daily, Kirby Crigler, Mir M, MD   clopidogrel (PLAVIX) tablet 75 mg, 75 mg, Oral, Daily, Kirby Crigler, Mir M, MD, 75 mg at 01/11/23 1827   [START ON 01/12/2023] enoxaparin (LOVENOX) injection 30 mg, 30 mg, Subcutaneous, Q24H, Kirby Crigler, Mir M, MD   escitalopram (LEXAPRO) tablet 10 mg, 10 mg, Oral, QHS, Kirby Crigler, Mir M, MD   food thickener (SIMPLYTHICK (NECTAR/LEVEL 2/MILDLY THICK)) 1 packet, 1 packet, Oral, PRN, Kirby Crigler, Mir M, MD   insulin aspart (novoLOG) injection 0-15 Units, 0-15 Units, Subcutaneous, TID WC, Kirby Crigler, Mir M, MD, 3 Units at 01/11/23 1825  insulin aspart (novoLOG) injection 0-5 Units, 0-5 Units, Subcutaneous, QHS, Kirby Crigler, Mir M, MD   insulin glargine-yfgn (SEMGLEE) injection 10 Units, 10 Units, Subcutaneous, QHS, Kirby Crigler, Mir M, MD   mycophenolate (CELLCEPT) tablet 500 mg, 500 mg, Oral, BID, Kirby Crigler, Mir M, MD   ondansetron [facility]) tablet 4 mg, 4 mg, Oral, Q6H PRN **OR** ondansetron (ZOFRAN) injection 4 mg, 4 mg, Intravenous, Q6H PRN, Kirby Crigler, Mir M, MD, 4 mg at 01/11/23 1942   Oral care mouth rinse, 15 mL, Mouth Rinse, 4 times per day, Kirby Crigler, Mir M,  MD   Oral care mouth rinse, 15 mL, Mouth Rinse, PRN, Kirby Crigler, Mir M, MD   Melene Muller ON 01/12/2023] predniSONE (DELTASONE) tablet 5 mg, 5 mg, Oral, Q breakfast, Kirby Crigler, Mir M, MD   rosuvastatin (CRESTOR) tablet 40 mg, 40 mg, Oral, QHS, Kirby Crigler, Mir M, MD   sodium bicarbonate tablet 1,300 mg, 1,300 mg, Oral, TID, Kirby Crigler, Mir M, MD   tacrolimus (PROGRAF) capsule 1 mg, 1 mg, Oral, BID, Kirby Crigler, Mir M, MD   zolpidem (AMBIEN) tablet 5 mg, 5 mg, Oral, QHS PRN, Kirby Crigler, Mir M, MD   Exam: Current vital signs: BP (!) 178/99 (BP Location: Right Arm)   Pulse 64   Temp 99.5 F (37.5 C) (Oral)   Resp 16   Ht 5\' 3"  (1.6 m)   Wt 66.4 kg   LMP 10/24/2012   SpO2 100%   BMI 25.93 kg/m  Vital signs in last 24 hours: Temp:  [97.9 F (36.6 C)-99.5 F (37.5 C)] 99.5 F (37.5 C) (08/20 1800) Pulse Rate:  [52-81] 64 (08/20 1645) Resp:  [16-20] 16 (08/20 1645) BP: (153-184)/(99-123) 178/99 (08/20 1800) SpO2:  [96 %-100 %] 100 % (08/20 1800) Weight:  [66.4 kg-67 kg] 66.4 kg (08/20 1737) General: Awake alert no distress HEENT: Normocephalic atraumatic Lungs: Clear Cardiovascular: Regular rhythm Abdomen nondistended nontender Extremities warm well-perfused Neurological exam Awake alert oriented x 3 No dysarthria No aphasia on my exam Cranial nerves: Pupils equal round react light, extraocular movements intact, visual fields full, very subtle right facial weakness at rest which she is able to correct when smiling. Motor examination unremarkable with normal strength without drift in all 4 extremities Sensation intact to light touch without extinction in all 4 extremities Coordination intact NIH stroke scale-1 for facial.   Labs I have reviewed labs in epic and the results pertinent to this consultation are:  CBC    Component Value Date/Time   WBC 10.7 (H) 01/11/2023 1000   RBC 5.30 (H) 01/11/2023 1000   HGB 15.6 (H) 01/11/2023 1000   HCT 49.5 (H) 01/11/2023 1000   PLT 193  01/11/2023 1000   MCV 93.4 01/11/2023 1000   MCH 29.4 01/11/2023 1000   MCHC 31.5 01/11/2023 1000   RDW 13.2 01/11/2023 1000   LYMPHSABS 1.3 01/11/2023 1000   MONOABS 1.1 (H) 01/11/2023 1000   EOSABS 0.2 01/11/2023 1000   BASOSABS 0.0 01/11/2023 1000    CMP     Component Value Date/Time   NA 135 01/11/2023 1000   NA 137 05/26/2018 0000   K 4.1 01/11/2023 1000   CL 106 01/11/2023 1000   CO2 17 (L) 01/11/2023 1000   GLUCOSE 91 01/11/2023 1000   BUN 30 (H) 01/11/2023 1000   BUN 22 (A) 05/26/2018 0000   CREATININE 2.11 (H) 01/11/2023 1000   CREATININE 1.28 (H) 01/25/2020 1137   CALCIUM 8.5 (L) 01/11/2023 1000   PROT 6.2 (L) 01/11/2023 1000   ALBUMIN 3.4 (L) 01/11/2023 1000  AST 20 01/11/2023 1000   ALT 21 01/11/2023 1000   ALKPHOS 84 01/11/2023 1000   BILITOT 0.8 01/11/2023 1000   GFRNONAA 27 (L) 01/11/2023 1000   GFRNONAA 48 (L) 01/25/2020 1137   GFRAA 56 (L) 01/25/2020 1137    Imaging I have reviewed the images obtained personally:  MRI examination of the brain: Acute infarction in the left frontal white matter.  Diminished left proximal MCA flow void-could be technique related or due to significant stenosis.  Further evaluation with CTA or MRI recommended.  Assessment:  57 year old with above past medical history with sudden onset of speech difficulty and right-sided facial weakness, presenting for evaluation outside the window for IV TNKase.  Clinical exam not concerning for LVO.  Needs full stroke workup for risk factor optimization.  Impression Acute ischemic stroke-etiology under investigation  Recommendations: Admit to hospitalist Frequent neurochecks Telemetry Aspirin 81+ Plavix 75--duration TBD after vessel imaging MRA head without contrast due to CKD Carotid Dopplers 2D echo A1c Lipid panel PT OT Speech therapy Permissive hypertension-allow for permissive hypertension and treat only if systolic is greater than 220 on a as needed basis for the next  24 to 48 hours.  Eventual blood pressure goal on discharge should be normotension Needs counseling on smoking cessation.  Stroke team to follow Plan was discussed with the patient   -- Milon Dikes, MD Neurologist [facility] Pager: [phone]

## 2023-01-11 NOTE — ED Provider Notes (Signed)
[place] EMERGENCY DEPARTMENT AT [facility] Provider Note   CSN: [id number] Arrival date & time: 01/11/23  4098     History  Chief Complaint  Patient presents with   Aphasia    Isabella Obrien is a 57 y.o. female.  HPI Patient presents with her husband assist with a history.  She presents with concern for aphasia.  Onset was 13 hours ago, after previously being normal.  Since onset aphasia has been persistent.  There is transient difficulty with swallowing secretions, but this has resolved.  Patient's history is notable for kidney transplant 13 years ago, and she notes that she is compliant with all medication.  Patient smokes, has no history of stroke.     Home Medications Prior to Admission medications   Medication Sig Start Date End Date Taking? Authorizing Provider  Accu-Chek FastClix Lancets MISC USE AS DIRECTED UP TO FOUR TIMES DAILY 04/09/21   Willow Ora, MD  ACCU-CHEK GUIDE test strip USE TO TEST AS DIRECTED UP TO 4 TIMES DAILY 03/25/20   Willow Ora, MD  albuterol (PROVENTIL) (2.5 MG/3ML) 0.083% nebulizer solution Take 3 mLs (2.5 mg total) by nebulization 2 (two) times daily as needed for wheezing or shortness of breath. 01/29/21   Willow Ora, MD  albuterol (VENTOLIN HFA) 108 (90 Base) MCG/ACT inhaler Inhale 2 puffs into the lungs every 6 (six) hours as needed for wheezing or shortness of breath. 11/03/20   Willow Ora, MD  aspirin EC 81 MG tablet Take 81 mg by mouth daily.    [provider]  atenolol (TENORMIN) 100 MG tablet Take 100 mg by mouth daily.    [provider]  atorvastatin (LIPITOR) 40 MG tablet TAKE 1 TABLET(40 MG) BY MOUTH AT BEDTIME 09/20/22   Willow Ora, MD  benazepril (LOTENSIN) 40 MG tablet Take 40 mg by mouth 2 (two) times daily.    [provider]  blood glucose meter kit and supplies KIT Dispense based on patient and insurance preference. Use up to four times daily as directed. 08/22/19   Willow Ora, MD  dapagliflozin propanediol (FARXIGA) 10 MG TABS tablet Take 10 mg by mouth daily. 12/25/19   [provider]  furosemide (LASIX) 40 MG tablet Take 1 tablet (40 mg total) by mouth daily as needed. 06/24/21   Willow Ora, MD  Insulin Glargine [facility]) 100 UNIT/ML Inject 10 Units into the skin at bedtime. Patient taking differently: Inject 12 Units into the skin at bedtime. 06/24/21 01/07/22  Willow Ora, MD  Insulin Pen Needle 32G X 4 MM MISC 50 each by Does not apply route in the morning and at bedtime. 07/25/19   Willow Ora, MD  ipratropium (ATROVENT) 0.02 % nebulizer solution Take 2.5 mLs (0.5 mg total) by nebulization 2 (two) times daily as needed for wheezing or shortness of breath. 01/29/21   Willow Ora, MD  mycophenolate (CELLCEPT) 500 MG tablet Take by mouth 2 (two) times daily.    [provider]  Omega-3 Fatty Acids (FISH OIL) 1000 MG CAPS Take 2 capsules by mouth daily. 02/13/14   [provider]  OZEMPIC, 0.25 OR 0.5 MG/DOSE, 2 MG/3ML SOPN INJECT 0.5MG  UNDER THE SKIN ONCE WEEKLY    [provider]  predniSONE (DELTASONE) 5 MG tablet Take 5 mg by mouth daily with breakfast.    [provider]  sitaGLIPtin-metformin (JANUMET) 50-1000 MG tablet TAKE 1 TABLET BY MOUTH EVERY 12 HOURS  02/08/22   Willow Ora, MD  sodium bicarbonate 325 MG tablet Take 650 mg by mouth 3 (three) times daily.     [provider]  spironolactone (ALDACTONE) 25 MG tablet Take 1 tablet by mouth daily. 08/28/20   [provider]  tacrolimus (PROGRAF) 1 MG capsule Take 1 mg by mouth 2 (two) times daily.     [provider]  zolpidem (AMBIEN) 10 MG tablet Take 10 mg by mouth at bedtime as needed. 12/06/21   [provider]      Allergies    Cefuroxime, Benadryl [diphenhydramine hcl], Ceftin, and Tape    Review of Systems   Review of Systems  All other systems reviewed and are negative.   Physical  Exam Updated Vital Signs BP (!) 153/120 Comment: nurse notified  Pulse 68   Temp 98.5 F (36.9 C) (Oral)   Resp 20   Ht 5\' 2"  (1.575 m)   Wt 67 kg   LMP 10/24/2012   SpO2 96%   BMI 27.02 kg/m  Physical Exam Vitals and nursing note reviewed.  Constitutional:      General: She is not in acute distress.    Appearance: She is well-developed.  HENT:     Head: Normocephalic and atraumatic.  Eyes:     Conjunctiva/sclera: Conjunctivae normal.  Cardiovascular:     Rate and Rhythm: Normal rate and regular rhythm.  Pulmonary:     Effort: Pulmonary effort is normal. No respiratory distress.     Breath sounds: Normal breath sounds. No stridor.  Abdominal:     General: There is no distension.  Skin:    General: Skin is warm and dry.  Neurological:     Mental Status: She is alert and oriented to person, place, and time.     Cranial Nerves: Dysarthria present. No cranial nerve deficit or facial asymmetry.     Motor: Atrophy present. No weakness, tremor, abnormal muscle tone or pronator drift.  Psychiatric:        Mood and Affect: Mood normal.     ED Results / Procedures / Treatments   Labs (all labs ordered are listed, but only abnormal results are displayed) Labs Reviewed  CBC - Abnormal; Notable for the following components:      Result Value   WBC 10.7 (*)    RBC 5.30 (*)    Hemoglobin 15.6 (*)    HCT 49.5 (*)    All other components within normal limits  DIFFERENTIAL - Abnormal; Notable for the following components:   Neutro Abs 8.0 (*)    Monocytes Absolute 1.1 (*)    All other components within normal limits  COMPREHENSIVE METABOLIC PANEL - Abnormal; Notable for the following components:   CO2 17 (*)    BUN 30 (*)    Creatinine, Ser 2.11 (*)    Calcium 8.5 (*)    Total Protein 6.2 (*)    Albumin 3.4 (*)    GFR, Estimated 27 (*)    All other components within normal limits  PROTIME-INR  APTT  ETHANOL  RAPID URINE DRUG SCREEN, HOSP PERFORMED  URINALYSIS,  ROUTINE W REFLEX MICROSCOPIC  CBG MONITORING, ED    EKG EKG Interpretation Date/Time:  Tuesday January 11 2023 09:00:24 EDT Ventricular Rate:  54 PR Interval:  125 QRS Duration:  85 QT Interval:  457 QTC Calculation: 434 R Axis:   152  Text Interpretation: Sinus rhythm Atrial premature complex Consider right atrial enlargement Consider right ventricular hypertrophy Abnormal T,  consider ischemia, diffuse leads Minimal ST elevation, inferior leads T wave changes old Confirmed by Coralee Pesa [phone]) on 01/11/2023 9:20:23 AM  Radiology MR BRAIN WO CONTRAST  Result Date: 01/11/2023 CLINICAL DATA:  Neuro deficit, acute, stroke suspected aphasia x 13 hours EXAM: MRI HEAD WITHOUT CONTRAST TECHNIQUE: Multiplanar, multiecho pulse sequences of the brain and surrounding structures were obtained without intravenous contrast. COMPARISON:  None Available. FINDINGS: Brain: Acute infarct in the left frontal white matter. Minimal associated edema without mass effect. No evidence of acute hemorrhage, mass lesion, midline shift or hydrocephalus. Vascular: Diminished left proximal MCA flow void. Other major arterial flow voids are maintained Skull and upper cervical spine: Normal marrow signal. Sinuses/Orbits: Clear sinuses.  No acute orbital findings. Other: No mastoid effusions. IMPRESSION: 1. Acute infarct in the left frontal white matter. 2. Diminished left proximal MCA flow void. This could be due to technique or secondary to significant stenosis. Recommend CTA or MRA to further evaluate. Electronically Signed   By: Feliberto Harts M.D.   On: 01/11/2023 11:53    Procedures Procedures    Medications Ordered in ED Medications  sodium chloride 0.9 % bolus 500 mL (500 mLs Intravenous New Bag/Given 01/11/23 1017)    Followed by  0.9 %  sodium chloride infusion (has no administration in time range)    ED Course/ Medical Decision Making/ A&P                                 Medical Decision  Making Adult female immunocompromised with history of kidney transplant, also with active smoking presents with new aphasia.  Patient is mildly hypertensive, and risks for stroke include hypertension, smoking, immunomodulation. Patient's neurodeficits are concerning, MRI ordered, labs sent. Cardiac 60 sinus normal Pulse ox 100% room air normal   Amount and/or Complexity of Data Reviewed Independent Historian: spouse External Data Reviewed: notes.    Details: Urgent care note from today reviewed Labs: ordered. Decision-making details documented in ED Course. Radiology: ordered and independent interpretation performed. Decision-making details documented in ED Course. ECG/medicine tests: ordered and independent interpretation performed. Decision-making details documented in ED Course.  Risk Prescription drug management. Decision regarding hospitalization. Diagnosis or treatment significantly limited by social determinants of health.   12:33 PM Patient in no distress, continues to have mild slurring of speech.  She, her family member both aware of all findings, most notable for MRI with evidence for acute stroke.  Labs otherwise unremarkable. With new stroke, I discussed the patient's case with our neurology colleagues, the patient will be admitted to the hospitalist team for ongoing monitoring, management, stroke evaluation.        Final Clinical Impression(s) / ED Diagnoses Final diagnoses:  Acute CVA (cerebrovascular accident) [facility])     Gerhard Munch, MD 01/11/23 1234

## 2023-01-12 ENCOUNTER — Inpatient Hospital Stay (HOSPITAL_COMMUNITY): Payer: BC Managed Care – PPO

## 2023-01-12 DIAGNOSIS — I639 Cerebral infarction, unspecified: Secondary | ICD-10-CM

## 2023-01-12 DIAGNOSIS — F17210 Nicotine dependence, cigarettes, uncomplicated: Secondary | ICD-10-CM

## 2023-01-12 DIAGNOSIS — I6389 Other cerebral infarction: Secondary | ICD-10-CM

## 2023-01-12 LAB — LIPID PANEL
Cholesterol: 112 mg/dL (ref 0–200)
HDL: 30 mg/dL — ABNORMAL LOW (ref >40)
LDL Cholesterol: 29 mg/dL (ref 0–99)
Total CHOL/HDL Ratio: 3.7 ratio
Triglycerides: 266 mg/dL — ABNORMAL HIGH (ref <150)
VLDL: 53 mg/dL — ABNORMAL HIGH (ref 0–40)

## 2023-01-12 LAB — CBC
HCT: 50.3 % — ABNORMAL HIGH (ref 36.0–46.0)
Hemoglobin: 16.4 g/dL — ABNORMAL HIGH (ref 12.0–15.0)
MCH: 29.5 pg (ref 26.0–34.0)
MCHC: 32.6 g/dL (ref 30.0–36.0)
MCV: 90.6 fL (ref 80.0–100.0)
Platelets: 198 10*3/uL (ref 150–400)
RBC: 5.55 MIL/uL — ABNORMAL HIGH (ref 3.87–5.11)
RDW: 13.2 % (ref 11.5–15.5)
WBC: 8.2 10*3/uL (ref 4.0–10.5)
nRBC: 0 % (ref 0.0–0.2)

## 2023-01-12 LAB — ECHOCARDIOGRAM COMPLETE BUBBLE STUDY
AR max vel: 2.13 cm2
AV Area VTI: 2 cm2
AV Area mean vel: 1.99 cm2
AV Mean grad: 6 mmHg
AV Peak grad: 11.8 mmHg
Ao pk vel: 1.72 m/s
Area-P 1/2: 3.89 cm2
Height: 63 in
S' Lateral: 2.6 cm
Weight: 2342.4 oz

## 2023-01-12 LAB — BASIC METABOLIC PANEL
Anion gap: 11 (ref 5–15)
BUN: 25 mg/dL — ABNORMAL HIGH (ref 6–20)
CO2: 22 mmol/L (ref 22–32)
Calcium: 8.8 mg/dL — ABNORMAL LOW (ref 8.9–10.3)
Chloride: 105 mmol/L (ref 98–111)
Creatinine, Ser: 2.16 mg/dL — ABNORMAL HIGH (ref 0.44–1.00)
GFR, Estimated: 26 mL/min — ABNORMAL LOW (ref >60)
Glucose, Bld: 104 mg/dL — ABNORMAL HIGH (ref 70–99)
Potassium: 3.8 mmol/L (ref 3.5–5.1)
Sodium: 138 mmol/L (ref 135–145)

## 2023-01-12 LAB — GLUCOSE, CAPILLARY
Glucose-Capillary: 95 mg/dL (ref 70–99)
Glucose-Capillary: 191 mg/dL — ABNORMAL HIGH (ref 70–99)
Glucose-Capillary: 132 mg/dL — ABNORMAL HIGH (ref 70–99)
Glucose-Capillary: 204 mg/dL — ABNORMAL HIGH (ref 70–99)

## 2023-01-12 MED ORDER — ALBUTEROL SULFATE (2.5 MG/3ML) 0.083% IN NEBU: 2.5000 mg | INHALATION_SOLUTION | Freq: Two times a day (BID) | RESPIRATORY_TRACT | Status: DC | PRN

## 2023-01-12 MED ORDER — DAPAGLIFLOZIN PROPANEDIOL 10 MG PO TABS
10.0000 mg | ORAL_TABLET | Freq: Every morning | ORAL | Status: DC
  Administered 2023-01-13: 10 mg via ORAL
  Filled 2023-01-12: qty 1

## 2023-01-12 MED ORDER — FINERENONE 20 MG PO TABS
20.0000 mg | ORAL_TABLET | Freq: Every day | ORAL | Status: DC
  Administered 2023-01-13: 20 mg via ORAL
  Filled 2023-01-12 (×2): qty 1

## 2023-01-12 MED ORDER — SODIUM CHLORIDE 0.9 % IV SOLN: INTRAVENOUS | Status: DC

## 2023-01-12 MED ORDER — IPRATROPIUM BROMIDE 0.02 % IN SOLN: 0.5000 mg | Freq: Two times a day (BID) | RESPIRATORY_TRACT | Status: DC | PRN

## 2023-01-12 NOTE — Hospital Course (Signed)
57 y.o.f w/ hypertension, hyperlipidemia, insulin-dependent type 2 diabetes, status post kidney transplant for FSGS being admitted to the hospital with acute CVA. On 8/19 around 8:20 PM, when she was having a smoothie with her husband, felt like the left side of her face was a little bit numb, felt awkward to swallow, and husband noticed that her speech was a little bit slurred.  He feels like it has improved a little bit overnight, but is still not completely normal. She states that through the night, she could tell that she needed to concentrate a little bit extra in order to swallow, but was not choking. ED Course:afebrile, blood pressure little bit on the high side but otherwise vital signs are unremarkable.  Lab work shows mild AKI creatinine 2.11, otherwise relatively unremarkable.  MRI of the brain was done, which as detailed below shows evidence of acute infarction in the left frontal white matter.  Hospitalist was contacted for admission, ER provider discussed with neurology who recommended admission to [facility] for formal neurology consultation. MRI showed acute left frontal infarct.Ldl at goal 29, A1c 7.5 .DOING WELL and neuro intact. S/p TEE 8/22>Negative bubble study, no LAA thrombus seen, normal function. Appendage images did not save to echo. Severe LVH; suspect related to long standing HTN . PER Neuro- consider 30 day cardiac monitor. Cont asa+ plavix x 3 wk then plavix alone, cont statins. She is mobilizing well ambulating w/ no weakness.  Plan for discharge home

## 2023-01-12 NOTE — Progress Notes (Signed)
PROGRESS NOTE JOUD BUSTILLO  BMW:[id number] DOB: Jun 30, 1965 DOA: 01/11/2023 PCP: Aliene Beams, MD  Brief Narrative/Hospital Course: 57 y.o.f w/ hypertension, hyperlipidemia, insulin-dependent type 2 diabetes, status post kidney transplant for FSGS being admitted to the hospital with acute CVA. On 8/19 around 8:20 PM, when she was having a smoothie with her husband, felt like the left side of her face was a little bit numb, felt awkward to swallow, and husband noticed that her speech was a little bit slurred.  He feels like it has improved a little bit overnight, but is still not completely normal. She states that through the night, she could tell that she needed to concentrate a little bit extra in order to swallow, but was not choking. ED Course:afebrile, blood pressure little bit on the high side but otherwise vital signs are unremarkable.  Lab work shows mild AKI creatinine 2.11, otherwise relatively unremarkable.  MRI of the brain was done, which as detailed below shows evidence of acute infarction in the left frontal white matter.  Hospitalist was contacted for admission, ER provider discussed with neurology who recommended admission to [facility] for formal neurology consultation     Subjective: Patient seen and examined this morning resting comfortably she reports her speech sounds better No numbness or focal weakness  Assessment and Plan: Principal Problem:   Acute CVA (cerebrovascular accident) (HCC)  Acute left frontal infarct: Continue to complete stroke workup so far MR angio head no acute finding, carotid duplex and tte pending. Ldl at goal 29, A1c 7.5.  Appreciate neurology input continue aspirin and Plavix x 3 weeks afterwards aspirin alone. Continue PT OT speech eval, monitor neurocheck per stroke protocol.  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  Monitor renal  function closely, resume her home meds including anticoagulation med steroid-if not in formulary patient will bring it  HTN: Allow permissive hypertension for 24 to 48 hours  Type 2 diabetes mellitus: A1c stable continue SSI and basal Lantus Recent Labs  Lab 01/11/23 0858 01/11/23 1400 01/11/23 1811 01/11/23 2108 01/12/23 0607 01/12/23 1154  GLUCAP 97  --  172* 124* 95 191*  HGBA1C  --  7.5*  --   --   --   --    HLD lipid panel at goal,   DVT prophylaxis: enoxaparin (LOVENOX) injection 30 mg Start: 01/12/23 1300 SCDs Start: 01/11/23 1252 Code Status:   Code Status: Full Code Family Communication: plan of care discussed with patient at bedside. Patient status is: Inpatient because of acute stroke Level of care: Telemetry Medical   Dispo: The patient is from: home            Anticipated disposition: tbd Objective: Vitals last 24 hrs: Vitals:   01/11/23 2338 01/12/23 0334 01/12/23 0725 01/12/23 1155  BP: (!) 173/92 (!) 162/109 (!) 142/100 (!) 161/99  Pulse: (!) 59 (!) 57 79 (!) 58  Resp: 18 18 17 16   Temp: 98.6 F (37 C) 98.3 F (36.8 C) 98 F (36.7 C) 97.9 F (36.6 C)  TempSrc: Oral Oral Oral Oral  SpO2: 92% 92% 95% 97%  Weight:      Height:       Weight change:   Physical Examination:  General exam: alert awake, older than stated age HEENT:Oral mucosa moist, Ear/Nose WNL grossly Respiratory system: bilaterally clear BS, no use of accessory muscle Cardiovascular system: S1 & S2 +, No  JVD. Gastrointestinal system: Abdomen soft,NT,ND, BS+ Nervous System:Alert, awake, moving extremities. Extremities: LE edema neg,distal peripheral pulses palpable.  Skin: No rashes,no icterus. MSK: Normal muscle bulk,tone, power  Medications reviewed:  Scheduled Meds:  aspirin EC  81 mg Oral Daily   clopidogrel  75 mg Oral Daily   [START ON 01/13/2023] dapagliflozin propanediol  10 mg Oral q AM   enoxaparin (LOVENOX) injection  30 mg Subcutaneous Q24H   escitalopram  10  mg Oral QHS   [START ON 01/13/2023] Finerenone  20 mg Oral q AM   insulin aspart  0-15 Units Subcutaneous TID WC   insulin aspart  0-5 Units Subcutaneous QHS   insulin glargine-yfgn  10 Units Subcutaneous QHS   mycophenolate  500 mg Oral BID   mouth rinse  15 mL Mouth Rinse 4 times per day   predniSONE  5 mg Oral Q breakfast   rosuvastatin  40 mg Oral QHS   sodium bicarbonate  1,300 mg Oral TID   tacrolimus  1 mg Oral BID   Continuous Infusions:  sodium chloride 75 mL/hr at 01/12/23 0848      Diet Order             Diet Carb Modified Fluid consistency: Nectar Thick; Room service appropriate? Yes  Diet effective now                 No intake or output data in the 24 hours ending 01/12/23 1304  Net IO Since Admission: 499 mL [01/12/23 1304]  Wt Readings from Last 3 Encounters:  01/11/23 66.4 kg  01/07/22 67 kg  06/24/21 70.2 kg     Unresulted Labs (From admission, onward)     Start     Ordered   01/11/23 1700  HIV Antibody (routine testing w rflx)  Once,   R        01/11/23 1700          Data Reviewed: I have personally reviewed following labs and imaging studies CBC: Recent Labs  Lab 01/11/23 1000 01/12/23 0734  WBC 10.7* 8.2  NEUTROABS 8.0*  --   HGB 15.6* 16.4*  HCT 49.5* 50.3*  MCV 93.4 90.6  PLT 193 198   Basic Metabolic Panel: Recent Labs  Lab 01/11/23 1000 01/12/23 0734  NA 135 138  K 4.1 3.8  CL 106 105  CO2 17* 22  GLUCOSE 91 104*  BUN 30* 25*  CREATININE 2.11* 2.16*  CALCIUM 8.5* 8.8*   GFR: Estimated Creatinine Clearance: 26.9 mL/min (A) (by C-G formula based on SCr of 2.16 mg/dL (H)). Liver Function Tests: Recent Labs  Lab 01/11/23 1000  AST 20  ALT 21  ALKPHOS 84  BILITOT 0.8  PROT 6.2*  ALBUMIN 3.4*   Recent Labs  Lab 01/11/23 1000  INR 0.9  BNP (last 3 results) No results for input(s): "PROBNP" in the last 8760 hours. HbA1C: Recent Labs    01/11/23 1400  HGBA1C 7.5*   CBG: Recent Labs  Lab 01/11/23 0858  01/11/23 1811 01/11/23 2108 01/12/23 0607 01/12/23 1154  GLUCAP 97 172* 124* 95 191*    No results found for this or any previous visit (from the past 240 hour(s)).  Antimicrobials: Anti-infectives (From admission, onward)    None      Culture/Microbiology No results found for: "SDES", "SPECREQUEST", "CULT", "REPTSTATUS"    Radiology Studies: MR ANGIO HEAD WO CONTRAST  Result Date: 01/12/2023 CLINICAL DATA:  Acute left frontal infarct and diminished left MCA flow void on  MRI EXAM: MRA HEAD WITHOUT CONTRAST TECHNIQUE: Angiographic images of the Circle of Willis were acquired using MRA technique without intravenous contrast. COMPARISON:  No prior MRA available, correlation is made with MRI head 01/11/2023 FINDINGS: MRA HEAD FINDINGS Anterior circulation: Both internal carotid arteries are patent to the termini, without significant stenosis. A1 segments patent, hypoplastic on the left. Normal anterior communicating artery. Anterior cerebral arteries are patent to their distal aspects without significant stenosis. No M1 stenosis or occlusion. Distal MCA branches perfused to their distal aspects without significant stenosis. Posterior circulation: Vertebral arteries patent to the vertebrobasilar junction without stenosis. Posterior inferior cerebral arteries patent bilaterally. Basilar patent to its distal aspect. Superior cerebellar arteries patent proximally. Patent P1 segments. PCAs perfused to their distal aspects without significant stenosis. The bilateral posterior communicating arteries are patent, diminutive on the right. Anatomic variants: None significant IMPRESSION: No intracranial large vessel occlusion or hemodynamically significant stenosis. Electronically Signed   By: Wiliam Ke M.D.   On: 01/12/2023 04:12   MR BRAIN WO CONTRAST  Result Date: 01/11/2023 CLINICAL DATA:  Neuro deficit, acute, stroke suspected aphasia x 13 hours EXAM: MRI HEAD WITHOUT CONTRAST TECHNIQUE:  Multiplanar, multiecho pulse sequences of the brain and surrounding structures were obtained without intravenous contrast. COMPARISON:  None Available. FINDINGS: Brain: Acute infarct in the left frontal white matter. Minimal associated edema without mass effect. No evidence of acute hemorrhage, mass lesion, midline shift or hydrocephalus. Vascular: Diminished left proximal MCA flow void. Other major arterial flow voids are maintained Skull and upper cervical spine: Normal marrow signal. Sinuses/Orbits: Clear sinuses.  No acute orbital findings. Other: No mastoid effusions. IMPRESSION: 1. Acute infarct in the left frontal white matter. 2. Diminished left proximal MCA flow void. This could be due to technique or secondary to significant stenosis. Recommend CTA or MRA to further evaluate. Electronically Signed   By: Feliberto Harts M.D.   On: 01/11/2023 11:53     LOS: 1 day   Lanae Boast, MD [facility]  01/12/2023, 1:04 PM

## 2023-01-12 NOTE — TOC Initial Note (Signed)
Transition of Care [facility]) - Initial/Assessment Note    Patient Details  Name: Isabella Obrien MRN: [id number] Date of Birth: December 17, 1965  Transition of [facility]) CM/SW Contact:    Isabella Balo, RN Phone Number: 01/12/2023, 11:31 AM  Clinical Narrative:                 Pt is from home with her spouse and daughter. She still works. She drives self.  Pt states her spouse can provide transportation if needed.  She manages her own medications and denies any issues.  Outpatient therapy referral sent to [facility] out[facility]. Information on the AVS and pt will call to schedule the first appointment.  Pts family will provide transport at time of d/c.   Expected Discharge Plan: OP Rehab Barriers to Discharge: Continued Medical Work up   Patient Goals and CMS Choice     Choice offered to / list presented to : Patient      Expected Discharge Plan and Services   Discharge Planning Services: CM Consult   Living arrangements for the past 2 months: Single Family Home                                      Prior Living Arrangements/Services Living arrangements for the past 2 months: Single Family Home Lives with:: Spouse, Adult Children Patient language and need for interpreter reviewed:: Yes Do you feel safe going back to the place where you live?: Yes            Criminal Activity/Legal Involvement Pertinent to Current Situation/Hospitalization: No - Comment as needed  Activities of Daily Living Home Assistive Devices/Equipment: CBG Meter ADL Screening (condition at time of admission) Patient's cognitive ability adequate to safely complete daily activities?: Yes Is the patient deaf or have difficulty hearing?: No Does the patient have difficulty seeing, even when wearing glasses/contacts?: No Does the patient have difficulty concentrating, remembering, or making decisions?: No Patient able to express need for assistance with ADLs?: Yes Does the patient have difficulty  dressing or bathing?: No Independently performs ADLs?: Yes (appropriate for developmental age) Does the patient have difficulty walking or climbing stairs?: No Weakness of Legs: None Weakness of Arms/Hands: None  Permission Sought/Granted                  Emotional Assessment Appearance:: Appears stated age Attitude/Demeanor/Rapport: Engaged Affect (typically observed): Accepting Orientation: : Oriented to Self, Oriented to Place, Oriented to  Time, Oriented to Situation   Psych Involvement: No (comment)  Admission diagnosis:  Acute CVA (cerebrovascular accident) [facility]) [I63.9] Patient Active Problem List   Diagnosis Date Noted   Acute CVA (cerebrovascular accident) (HCC) 01/11/2023   Insomnia 02/02/2020   Internal hemorrhoids 12/20/2017   Menopausal hot flushes 09/06/2017   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   Combined hyperlipidemia associated with type 2 diabetes mellitus (HCC) 07/18/2013   Proteinuria 07/18/2013   Sciatica of right side 07/18/2013   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   CKD (chronic kidney disease) 07/18/2013   Hypertension due to kidney transplant 06/10/2011   History of living-donor kidney transplantation 04/19/2011   Long-term use of immunosuppressant medication 04/19/2011   PCP:  Isabella Beams, MD Pharmacy:   [facility] [phone] [place], [place] - [address] AT [facility] OF [address] & [facility] [address] [place] [address] Phone: [phone] Fax:  [phone]     Social Determinants of Health (SDOH) Social History: SDOH Screenings   Food Insecurity: No Food Insecurity (01/11/2023)  Housing: Low Risk  (01/11/2023)  Transportation Needs: No Transportation Needs (01/11/2023)  Utilities: Not At Risk (01/11/2023)  Depression (PHQ2-9): Low Risk  (11/03/2020)  Tobacco Use: High Risk (01/11/2023)   SDOH Interventions:     Readmission Risk  Interventions     No data to display

## 2023-01-12 NOTE — Progress Notes (Addendum)
STROKE TEAM PROGRESS NOTE   BRIEF HPI [name] is a 57 y.o. female with history of CKD, kidney transplant, diabetes, hypertension, hyperlipidemia and tobacco use presenting with slurred speech and right facial droop.  Patient states that symptoms began sometime in the evening of 8/19, but that she initially attributed them to drinking a cold beverage.  The following day, her colleagues noted that her speech was off and noted her facial droop and directed her to seek medical assistance.  She was found to have a small acute infarct in left frontal white matter on MRI.   SIGNIFICANT HOSPITAL EVENTS   INTERIM HISTORY/SUBJECTIVE Patient has been hemodynamically stable and reports that her speech is sounding more normal today.  OBJECTIVE  CBC    Component Value Date/Time   WBC 8.2 01/12/2023 0734   RBC 5.55 (H) 01/12/2023 0734   HGB 16.4 (H) 01/12/2023 0734   HCT 50.3 (H) 01/12/2023 0734   PLT 198 01/12/2023 0734   MCV 90.6 01/12/2023 0734   MCH 29.5 01/12/2023 0734   MCHC 32.6 01/12/2023 0734   RDW 13.2 01/12/2023 0734   LYMPHSABS 1.3 01/11/2023 1000   MONOABS 1.1 (H) 01/11/2023 1000   EOSABS 0.2 01/11/2023 1000   BASOSABS 0.0 01/11/2023 1000    BMET    Component Value Date/Time   NA 138 01/12/2023 0734   NA 137 05/26/2018 0000   K 3.8 01/12/2023 0734   CL 105 01/12/2023 0734   CO2 22 01/12/2023 0734   GLUCOSE 104 (H) 01/12/2023 0734   BUN 25 (H) 01/12/2023 0734   BUN 22 (A) 05/26/2018 0000   CREATININE 2.16 (H) 01/12/2023 0734   CREATININE 1.28 (H) 01/25/2020 1137   CALCIUM 8.8 (L) 01/12/2023 0734   GFRNONAA 26 (L) 01/12/2023 0734   GFRNONAA 48 (L) 01/25/2020 1137    IMAGING past 24 hours MR ANGIO HEAD WO CONTRAST  Result Date: 01/12/2023 CLINICAL DATA:  Acute left frontal infarct and diminished left MCA flow void on MRI EXAM: MRA HEAD WITHOUT CONTRAST TECHNIQUE: Angiographic images of the Circle of Willis were acquired using MRA technique without  intravenous contrast. COMPARISON:  No prior MRA available, correlation is made with MRI head 01/11/2023 FINDINGS: MRA HEAD FINDINGS Anterior circulation: Both internal carotid arteries are patent to the termini, without significant stenosis. A1 segments patent, hypoplastic on the left. Normal anterior communicating artery. Anterior cerebral arteries are patent to their distal aspects without significant stenosis. No M1 stenosis or occlusion. Distal MCA branches perfused to their distal aspects without significant stenosis. Posterior circulation: Vertebral arteries patent to the vertebrobasilar junction without stenosis. Posterior inferior cerebral arteries patent bilaterally. Basilar patent to its distal aspect. Superior cerebellar arteries patent proximally. Patent P1 segments. PCAs perfused to their distal aspects without significant stenosis. The bilateral posterior communicating arteries are patent, diminutive on the right. Anatomic variants: None significant IMPRESSION: No intracranial large vessel occlusion or hemodynamically significant stenosis. Electronically Signed   By: Wiliam Ke M.D.   On: 01/12/2023 04:12    Vitals:   01/11/23 2338 01/12/23 0334 01/12/23 0725 01/12/23 1155  BP: (!) 173/92 (!) 162/109 (!) 142/100 (!) 161/99  Pulse: (!) 59 (!) 57 79 (!) 58  Resp: 18 18 17 16   Temp: 98.6 F (37 C) 98.3 F (36.8 C) 98 F (36.7 C) 97.9 F (36.6 C)  TempSrc: Oral Oral Oral Oral  SpO2: 92% 92% 95% 97%  Weight:      Height:  PHYSICAL EXAM General:  Alert, well-nourished, well-developed patient in no acute distress Psych:  Mood and affect appropriate for situation CV: Regular rate and rhythm on monitor Respiratory:  Regular, unlabored respirations on room air GI: Abdomen soft and nontender   NEURO:  Mental Status: AA&Ox3, patient is able to give clear and coherent history Speech/Language: speech is without dysarthria or aphasia.    Cranial Nerves:  II: PERRL. Visual  fields full.  III, IV, VI: EOMI. Eyelids elevate symmetrically.  V: Sensation is intact to light touch and symmetrical to face.  VII: Slight right facial droop VIII: hearing intact to voice. IX, X: Phonation is normal.  [id number] shrug 5/5. XII: tongue is midline without fasciculations. Motor: 5/5 strength to all muscle groups tested.  Tone: is normal and bulk is normal Sensation- Intact to light touch bilaterally.   Coordination: FTN intact bilaterally.No drift.  Gait- deferred   ASSESSMENT/PLAN  Acute Ischemic Infarct:  left frontal white matter acute infarct, etiology: Uncertain at this time, most likely small vessel disease MRI small acute infarct in left frontal white matter MRA no LVO or hemodynamically significant stenosis Carotid Doppler unremarkable 2D Echo EF 55 to 60% TCD bubble study no PFO TEE pending Consider 30-day cardiac monitor as outpt if above workup negative LDL 29 HgbA1c 7.5 UDS positive for THC VTE prophylaxis -Lovenox aspirin 81 mg daily prior to admission, now on aspirin 81 mg daily and clopidogrel 75 mg daily for 3 weeks and then Plavix alone. Therapy recommendations:  Outpatient PT/OT Disposition: Pending  Hypertension Home meds: Atenolol 100 mg daily, Benzeapril 40 mg twice daily Stable on the high end Long-term BP goal normotensive  Hyperlipidemia Home meds: Rosuvastatin 40 mg daily, resumed in hospital LDL 29, goal < 70 Continue statin at discharge  Diabetes type II Uncontrolled Home meds:  Farxiga 10 mg daily, Ozempic 1 mg weekly, insulin glargine 10 units daily HgbA1c 7.5, goal < 7.0 CBGs SSI Recommend close follow-up with PCP for better DM control  Tobacco Abuse Patient smokes  Patient is willing to quit Patient states she will request nicotine replacement therapy if needed  Substance Abuse UDS positive for THC  Will discuss cessation with patient  Other Stroke Risk Factors None  Other Active Problems History of renal  transplant-continue home Prograf, prednisone and CellCept, creatinine 2.11 Mild leukocytosis WBC 10.7  Hospital day # 1    ATTENDING NOTE: I reviewed above note and agree with the assessment and plan. Pt was seen and examined.   Patient was seen at vascular lab, TCD bubble study, carotid Doppler and 2D echo, LE venous Doppler all negative.  Neuro intact except slight right nasolabial fold flattening, otherwise symptom has resolved.  MRI showed left frontal white matter versus juxtacortical infarcts.  Etiology unclear, likely small vessel disease, however recommend TEE to rule out cardioembolic source.  If negative, recommend 30-day CardioNet monitor as outpatient to rule out A-fib.  Continue DAPT and statin.  Will follow.  For detailed assessment and plan, please refer to above/below as I have made changes wherever appropriate.   Marvel Plan, MD PhD Stroke Neurology 01/12/2023 7:27 PM      To contact Stroke Continuity provider, please refer to [email or web address]. After hours, contact General Neurology

## 2023-01-12 NOTE — Evaluation (Signed)
Occupational Therapy Evaluation/Discharge Patient Details Name: Isabella Obrien MRN: [id number] DOB: [birth year] Today's Date: 01/12/2023   History of Present Illness Pt is a 57 y/o female presenting with altered speech and facial droop. MRI Brain showed infarct in L frontal white matter. PMH: HTN, HLD, DM2, hx of kidney transplant   Clinical Impression   PTA, pt lives with family, typically completely Independent with all daily tasks and works as a Geophysicist/field seismologist. Pt presents now at baseline for ADLs, mobility and stair mgmt. No LOB or safety concerns. Strength WFL and pt denies any vision changes. Educated on BE FAST stroke signs/symptoms. No further skilled OT services needed at this time.       If plan is discharge home, recommend the following:      Functional Status Assessment  Patient has not had a recent decline in their functional status  Equipment Recommendations  None recommended by OT    Recommendations for Other Services       Precautions / Restrictions Precautions Precautions: None Restrictions Weight Bearing Restrictions: No      Mobility Bed Mobility Overal bed mobility: Independent                  Transfers Overall transfer level: Independent Equipment used: None                      Balance Overall balance assessment: Independent                                         ADL either performed or assessed with clinical judgement   ADL Overall ADL's : Independent                                             Vision Baseline Vision/History: 0 No visual deficits Ability to See in Adequate Light: 0 Adequate Patient Visual Report: No change from baseline Vision Assessment?: No apparent visual deficits     Perception         Praxis         Pertinent Vitals/Pain Pain Assessment Pain Assessment: No/denies pain     Extremity/Trunk Assessment Upper Extremity Assessment Upper Extremity  Assessment: Overall WFL for tasks assessed   Lower Extremity Assessment Lower Extremity Assessment: Overall WFL for tasks assessed   Cervical / Trunk Assessment Cervical / Trunk Assessment: Normal   Communication Communication Communication: No apparent difficulties (reports much improved with occasional issues)   Cognition Arousal: Alert Behavior During Therapy: WFL for tasks assessed/performed Overall Cognitive Status: Within Functional Limits for tasks assessed                                       General Comments       Exercises     Shoulder Instructions      Home Living Family/patient expects to be discharged to:: Private residence Living Arrangements: Spouse/significant other Available Help at Discharge: Family Type of Home: House Home Access: Stairs to enter Secretary/administrator of Steps: 6 Entrance Stairs-Rails: Right;Left Home Layout: One level     Bathroom Shower/Tub: Tub/shower unit;Walk-in shower   Bathroom Toilet: Standard     Home Equipment: None  Prior Functioning/Environment Prior Level of Function : Independent/Modified Independent;Working/employed;Driving               ADLs Comments: works as a Geologist, 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[employer], quantity Problem List:        OT Treatment/Interventions:      OT Goals(Current goals can be found in the care plan section) Acute Rehab OT Goals Patient Stated Goal: back to work OT Goal Formulation: All assessment and education complete, DC therapy  OT Frequency:      Co-evaluation              AM-PAC OT "6 Clicks" Daily Activity     Outcome Measure Help from another person eating meals?: None Help from another person taking care of personal grooming?: None Help from another person toileting, which includes using toliet, bedpan, or urinal?: None Help from another person bathing (including washing, rinsing, drying)?: None Help from another person to put on and  taking off regular upper body clothing?: None Help from another person to put on and taking off regular lower body clothing?: None 6 Click Score: 24   End of Session Nurse Communication: Mobility status  Activity Tolerance: Patient tolerated treatment well Patient left: in bed;with call bell/phone within reach  OT Visit Diagnosis: Other abnormalities of gait and mobility (R26.89)                Time: 1610-9604 OT Time Calculation (min): 15 min Charges:  OT General Charges $OT Visit: 1 Visit OT Evaluation $OT Eval Low Complexity: 1 Low  Bradd Canary, OTR/L Acute Rehab Services Office: [phone]   Lorre Munroe 01/12/2023, 9:38 AM

## 2023-01-12 NOTE — H&P (View-Only) (Signed)
STROKE TEAM PROGRESS NOTE   BRIEF HPI [name] is a 58 y.o. female with history of CKD, kidney transplant, diabetes, hypertension, hyperlipidemia and tobacco use presenting with slurred speech and right facial droop.  Patient states that symptoms began sometime in the evening of 8/19, but that she initially attributed them to drinking a cold beverage.  The following day, her colleagues noted that her speech was off and noted her facial droop and directed her to seek medical assistance.  She was found to have a small acute infarct in left frontal white matter on MRI.   SIGNIFICANT HOSPITAL EVENTS   INTERIM HISTORY/SUBJECTIVE Patient has been hemodynamically stable and reports that her speech is sounding more normal today.  OBJECTIVE  CBC    Component Value Date/Time   WBC 8.2 01/12/2023 0734   RBC 5.55 (H) 01/12/2023 0734   HGB 16.4 (H) 01/12/2023 0734   HCT 50.3 (H) 01/12/2023 0734   PLT 198 01/12/2023 0734   MCV 90.6 01/12/2023 0734   MCH 29.5 01/12/2023 0734   MCHC 32.6 01/12/2023 0734   RDW 13.2 01/12/2023 0734   LYMPHSABS 1.3 01/11/2023 1000   MONOABS 1.1 (H) 01/11/2023 1000   EOSABS 0.2 01/11/2023 1000   BASOSABS 0.0 01/11/2023 1000    BMET    Component Value Date/Time   NA 138 01/12/2023 0734   NA 137 05/26/2018 0000   K 3.8 01/12/2023 0734   CL 105 01/12/2023 0734   CO2 22 01/12/2023 0734   GLUCOSE 104 (H) 01/12/2023 0734   BUN 25 (H) 01/12/2023 0734   BUN 22 (A) 05/26/2018 0000   CREATININE 2.16 (H) 01/12/2023 0734   CREATININE 1.28 (H) 01/25/2020 1137   CALCIUM 8.8 (L) 01/12/2023 0734   GFRNONAA 26 (L) 01/12/2023 0734   GFRNONAA 48 (L) 01/25/2020 1137    IMAGING past 24 hours MR ANGIO HEAD WO CONTRAST  Result Date: 01/12/2023 CLINICAL DATA:  Acute left frontal infarct and diminished left MCA flow void on MRI EXAM: MRA HEAD WITHOUT CONTRAST TECHNIQUE: Angiographic images of the Circle of Willis were acquired using MRA technique without  intravenous contrast. COMPARISON:  No prior MRA available, correlation is made with MRI head 01/11/2023 FINDINGS: MRA HEAD FINDINGS Anterior circulation: Both internal carotid arteries are patent to the termini, without significant stenosis. A1 segments patent, hypoplastic on the left. Normal anterior communicating artery. Anterior cerebral arteries are patent to their distal aspects without significant stenosis. No M1 stenosis or occlusion. Distal MCA branches perfused to their distal aspects without significant stenosis. Posterior circulation: Vertebral arteries patent to the vertebrobasilar junction without stenosis. Posterior inferior cerebral arteries patent bilaterally. Basilar patent to its distal aspect. Superior cerebellar arteries patent proximally. Patent P1 segments. PCAs perfused to their distal aspects without significant stenosis. The bilateral posterior communicating arteries are patent, diminutive on the right. Anatomic variants: None significant IMPRESSION: No intracranial large vessel occlusion or hemodynamically significant stenosis. Electronically Signed   By: Wiliam Ke M.D.   On: 01/12/2023 04:12    Vitals:   01/11/23 2338 01/12/23 0334 01/12/23 0725 01/12/23 1155  BP: (!) 173/92 (!) 162/109 (!) 142/100 (!) 161/99  Pulse: (!) 59 (!) 57 79 (!) 58  Resp: 18 18 17 16   Temp: 98.6 F (37 C) 98.3 F (36.8 C) 98 F (36.7 C) 97.9 F (36.6 C)  TempSrc: Oral Oral Oral Oral  SpO2: 92% 92% 95% 97%  Weight:      Height:  PHYSICAL EXAM General:  Alert, well-nourished, well-developed patient in no acute distress Psych:  Mood and affect appropriate for situation CV: Regular rate and rhythm on monitor Respiratory:  Regular, unlabored respirations on room air GI: Abdomen soft and nontender   NEURO:  Mental Status: AA&Ox3, patient is able to give clear and coherent history Speech/Language: speech is without dysarthria or aphasia.    Cranial Nerves:  II: PERRL. Visual  fields full.  III, IV, VI: EOMI. Eyelids elevate symmetrically.  V: Sensation is intact to light touch and symmetrical to face.  VII: Slight right facial droop VIII: hearing intact to voice. IX, X: Phonation is normal.  ZO:XWRUEAVW shrug 5/5. XII: tongue is midline without fasciculations. Motor: 5/5 strength to all muscle groups tested.  Tone: is normal and bulk is normal Sensation- Intact to light touch bilaterally.   Coordination: FTN intact bilaterally.No drift.  Gait- deferred   ASSESSMENT/PLAN  Acute Ischemic Infarct:  left frontal white matter acute infarct, etiology: Uncertain at this time, most likely small vessel disease MRI small acute infarct in left frontal white matter MRA no LVO or hemodynamically significant stenosis Carotid Doppler unremarkable 2D Echo EF 55 to 60% TCD bubble study no PFO TEE pending Consider 30-day cardiac monitor as outpt if above workup negative LDL 29 HgbA1c 7.5 UDS positive for THC VTE prophylaxis -Lovenox aspirin 81 mg daily prior to admission, now on aspirin 81 mg daily and clopidogrel 75 mg daily for 3 weeks and then Plavix alone. Therapy recommendations:  Outpatient PT/OT Disposition: Pending  Hypertension Home meds: Atenolol 100 mg daily, Benzeapril 40 mg twice daily Stable on the high end Long-term BP goal normotensive  Hyperlipidemia Home meds: Rosuvastatin 40 mg daily, resumed in hospital LDL 29, goal < 70 Continue statin at discharge  Diabetes type II Uncontrolled Home meds:  Farxiga 10 mg daily, Ozempic 1 mg weekly, insulin glargine 10 units daily HgbA1c 7.5, goal < 7.0 CBGs SSI Recommend close follow-up with PCP for better DM control  Tobacco Abuse Patient smokes  Patient is willing to quit Patient states she will request nicotine replacement therapy if needed  Substance Abuse UDS positive for THC  Will discuss cessation with patient  Other Stroke Risk Factors None  Other Active Problems History of renal  transplant-continue home Prograf, prednisone and CellCept, creatinine 2.11 Mild leukocytosis WBC 10.7  Hospital day # 1    ATTENDING NOTE: I reviewed above note and agree with the assessment and plan. Pt was seen and examined.   Patient was seen at vascular lab, TCD bubble study, carotid Doppler and 2D echo, LE venous Doppler all negative.  Neuro intact except slight right nasolabial fold flattening, otherwise symptom has resolved.  MRI showed left frontal white matter versus juxtacortical infarcts.  Etiology unclear, likely small vessel disease, however recommend TEE to rule out cardioembolic source.  If negative, recommend 30-day CardioNet monitor as outpatient to rule out A-fib.  Continue DAPT and statin.  Will follow.  For detailed assessment and plan, please refer to above/below as I have made changes wherever appropriate.   Marvel Plan, MD PhD Stroke Neurology 01/12/2023 7:27 PM      To contact Stroke Continuity provider, please refer to [email or web address]. After hours, contact General Neurology

## 2023-01-12 NOTE — Progress Notes (Signed)
   [facility] has been requested to perform a transesophageal echocardiogram on Isabella Obrien for CVA.  After careful review of history and examination, the risks and benefits of transesophageal echocardiogram have been explained including risks of esophageal damage, perforation (1:10,000 risk), bleeding, pharyngeal hematoma as well as other potential complications associated with conscious sedation including aspiration, arrhythmia, respiratory failure and death. Alternatives to treatment were discussed, questions were answered. Patient is willing to proceed.   Has not taken GLP-1 since last Wednesday.  Abagail Kitchens, PA-C  01/12/2023 4:08 PM

## 2023-01-12 NOTE — Progress Notes (Signed)
Physical Therapy Note  Spoke with occupational therapy after their initial evaluation. OT reports patient is functioning at a high level of independence and no physical therapy is indicated at this time. PT is signing-off. Please re-order if there is any significant change in status. Thank you for this referral.   Kathlyn Sacramento, PT, DPT [facility]  Rehabilitation Services Physical Therapist Office: [phone] Website: [email or web address]

## 2023-01-12 NOTE — Plan of Care (Signed)

## 2023-01-13 ENCOUNTER — Inpatient Hospital Stay (HOSPITAL_COMMUNITY): Payer: BC Managed Care – PPO | Admitting: Anesthesiology

## 2023-01-13 ENCOUNTER — Other Ambulatory Visit (HOSPITAL_COMMUNITY): Payer: Self-pay

## 2023-01-13 ENCOUNTER — Other Ambulatory Visit: Payer: Self-pay | Admitting: Cardiology

## 2023-01-13 ENCOUNTER — Inpatient Hospital Stay (HOSPITAL_COMMUNITY): Payer: BC Managed Care – PPO

## 2023-01-13 ENCOUNTER — Encounter (HOSPITAL_COMMUNITY): Payer: Self-pay | Admitting: Internal Medicine

## 2023-01-13 ENCOUNTER — Encounter (HOSPITAL_COMMUNITY)
Admission: EM | Disposition: A | Discharge status: Home / Self Care | Payer: Self-pay | Source: Home / Self Care | Attending: Internal Medicine

## 2023-01-13 ENCOUNTER — Other Ambulatory Visit: Payer: Self-pay

## 2023-01-13 DIAGNOSIS — I3139 Other pericardial effusion (noninflammatory): Secondary | ICD-10-CM

## 2023-01-13 DIAGNOSIS — I639 Cerebral infarction, unspecified: Secondary | ICD-10-CM | POA: Diagnosis not present

## 2023-01-13 HISTORY — PX: TEE WITHOUT CARDIOVERSION: SHX5443

## 2023-01-13 LAB — GLUCOSE, CAPILLARY
Glucose-Capillary: 124 mg/dL — ABNORMAL HIGH (ref 70–99)
Glucose-Capillary: 129 mg/dL — ABNORMAL HIGH (ref 70–99)

## 2023-01-13 LAB — BASIC METABOLIC PANEL
Anion gap: 11 (ref 5–15)
BUN: 29 mg/dL — ABNORMAL HIGH (ref 6–20)
CO2: 23 mmol/L (ref 22–32)
Calcium: 8.7 mg/dL — ABNORMAL LOW (ref 8.9–10.3)
Chloride: 106 mmol/L (ref 98–111)
Creatinine, Ser: 2.28 mg/dL — ABNORMAL HIGH (ref 0.44–1.00)
GFR, Estimated: 25 mL/min — ABNORMAL LOW (ref >60)
Glucose, Bld: 103 mg/dL — ABNORMAL HIGH (ref 70–99)
Potassium: 3.6 mmol/L (ref 3.5–5.1)
Sodium: 140 mmol/L (ref 135–145)

## 2023-01-13 LAB — ECHO TEE

## 2023-01-13 SURGERY — ECHOCARDIOGRAM, TRANSESOPHAGEAL
Anesthesia: Monitor Anesthesia Care

## 2023-01-13 MED ORDER — HYDRALAZINE HCL 20 MG/ML IJ SOLN
5.0000 mg | INTRAMUSCULAR | Status: DC | PRN
  Administered 2023-01-13: 5 mg via INTRAVENOUS

## 2023-01-13 MED ORDER — HYDRALAZINE HCL 20 MG/ML IJ SOLN
5.0000 mg | Freq: Once | INTRAMUSCULAR | Status: AC
  Administered 2023-01-13: 5 mg via INTRAVENOUS

## 2023-01-13 MED ORDER — HYDRALAZINE HCL 20 MG/ML IJ SOLN
INTRAMUSCULAR | Status: AC
  Administered 2023-01-13: 5 mg via INTRAVENOUS
  Filled 2023-01-13: qty 1

## 2023-01-13 MED ORDER — PROPOFOL 500 MG/50ML IV EMUL
INTRAVENOUS | Status: DC | PRN
  Administered 2023-01-13: 200 ug/kg/min via INTRAVENOUS

## 2023-01-13 MED ORDER — LIDOCAINE 2% (20 MG/ML) 5 ML SYRINGE
INTRAMUSCULAR | Status: DC | PRN
  Administered 2023-01-13: 100 mg via INTRAVENOUS

## 2023-01-13 MED ORDER — PROPOFOL 10 MG/ML IV BOLUS
INTRAVENOUS | Status: DC | PRN
Start: 2023-01-13 — End: 2023-01-13
  Administered 2023-01-13: 50 mg via INTRAVENOUS

## 2023-01-13 MED ORDER — CLOPIDOGREL BISULFATE 75 MG PO TABS
75.0000 mg | ORAL_TABLET | Freq: Every day | ORAL | 0 refills | Status: AC
  Filled 2023-01-13: qty 30, 30d supply, fill #0

## 2023-01-13 MED ORDER — ASPIRIN 81 MG PO TBEC
81.0000 mg | DELAYED_RELEASE_TABLET | Freq: Every day | ORAL | 0 refills | Status: AC
  Filled 2023-01-13: qty 21, 21d supply, fill #0

## 2023-01-13 MED ORDER — ROSUVASTATIN CALCIUM 40 MG PO TABS
40.0000 mg | ORAL_TABLET | Freq: Every day | ORAL | 0 refills | Status: DC
  Filled 2023-01-13: qty 30, 30d supply, fill #0

## 2023-01-13 NOTE — Anesthesia Preprocedure Evaluation (Addendum)
Anesthesia Evaluation  Patient identified by MRN, date of birth, ID band Patient awake    Reviewed: Allergy & Precautions, NPO status , Patient's Chart, lab work & pertinent test results, reviewed documented beta blocker date and time   Airway Mallampati: II  TM Distance: >3 FB Neck ROM: Full    Dental  (+) Teeth Intact, Dental Advisory Given   Pulmonary Current Smoker   Pulmonary exam normal breath sounds clear to auscultation       Cardiovascular hypertension, Pt. on home beta blockers and Pt. on medications Normal cardiovascular exam Rhythm:Regular Rate:Normal     Neuro/Psych  Neuromuscular disease CVA    GI/Hepatic negative GI ROS, Neg liver ROS,,,  Endo/Other  diabetes, Type 2, Insulin Dependent    Renal/GU Renal InsufficiencyRenal disease (s/p renal tx)     Musculoskeletal negative musculoskeletal ROS (+)    Abdominal   Peds  Hematology negative hematology ROS (+)   Anesthesia Other Findings Day of surgery medications reviewed with the patient.  Reproductive/Obstetrics                             Anesthesia Physical Anesthesia Plan  ASA: 3  Anesthesia Plan: MAC   Post-op Pain Management: Minimal or no pain anticipated   Induction: Intravenous  PONV Risk Score and Plan: 1 and TIVA and Treatment may vary due to age or medical condition  Airway Management Planned: Natural Airway and Simple Face Mask  Additional Equipment:   Intra-op Plan:   Post-operative Plan:   Informed Consent: I have reviewed the patients History and Physical, chart, labs and discussed the procedure including the risks, benefits and alternatives for the proposed anesthesia with the patient or authorized representative who has indicated his/her understanding and acceptance.     Dental advisory given  Plan Discussed with: CRNA  Anesthesia Plan Comments:        Anesthesia Quick Evaluation

## 2023-01-13 NOTE — Transfer of Care (Signed)
Immediate Anesthesia Transfer of Care Note  Patient: Isabella Obrien  Procedure(s) Performed: TRANSESOPHAGEAL ECHOCARDIOGRAM  Patient Location: PACU  Anesthesia Type:MAC  Level of Consciousness: awake, alert , and oriented  Airway & Oxygen Therapy: Patient Spontanous Breathing  Post-op Assessment: Report given to RN and Post -op Vital signs reviewed and stable  Post vital signs: Reviewed and stable  Last Vitals:  Vitals Value Taken Time  BP    Temp    Pulse    Resp    SpO2      Last Pain:  Vitals:   01/13/23 0943  TempSrc: Temporal  PainSc:          Complications: No notable events documented.

## 2023-01-13 NOTE — Progress Notes (Addendum)
STROKE TEAM PROGRESS NOTE   BRIEF HPI [name] is a 57 y.o. female with history of CKD, kidney transplant, diabetes, hypertension, hyperlipidemia and tobacco use presenting with slurred speech and right facial droop.  Patient states that symptoms began sometime in the evening of 8/19, but that she initially attributed them to drinking a cold beverage.  The following day, her colleagues noted that her speech was off and noted her facial droop and directed her to seek medical assistance.  She was found to have a small acute infarct in left frontal white matter on MRI.   SIGNIFICANT HOSPITAL EVENTS   INTERIM HISTORY/SUBJECTIVE Patient has been stable and underwent her TEE today, which demonstrated left ventricular hypertrophy but no clot or intra-atrial shunt.  She is anticipating discharge today.  OBJECTIVE  CBC    Component Value Date/Time   WBC 8.2 01/12/2023 0734   RBC 5.55 (H) 01/12/2023 0734   HGB 16.4 (H) 01/12/2023 0734   HCT 50.3 (H) 01/12/2023 0734   PLT 198 01/12/2023 0734   MCV 90.6 01/12/2023 0734   MCH 29.5 01/12/2023 0734   MCHC 32.6 01/12/2023 0734   RDW 13.2 01/12/2023 0734   LYMPHSABS 1.3 01/11/2023 1000   MONOABS 1.1 (H) 01/11/2023 1000   EOSABS 0.2 01/11/2023 1000   BASOSABS 0.0 01/11/2023 1000    BMET    Component Value Date/Time   NA 140 01/13/2023 0312   NA 137 05/26/2018 0000   K 3.6 01/13/2023 0312   CL 106 01/13/2023 0312   CO2 23 01/13/2023 0312   GLUCOSE 103 (H) 01/13/2023 0312   BUN 29 (H) 01/13/2023 0312   BUN 22 (A) 05/26/2018 0000   CREATININE 2.28 (H) 01/13/2023 0312   CREATININE 1.28 (H) 01/25/2020 1137   CALCIUM 8.7 (L) 01/13/2023 0312   GFRNONAA 25 (L) 01/13/2023 0312   GFRNONAA 48 (L) 01/25/2020 1137    IMAGING past 24 hours ECHO TEE  Result Date: 01/13/2023    TRANSESOPHOGEAL ECHO REPORT   Patient Name:   Isabella Obrien Date of Exam: 01/13/2023 Medical Rec #:  [id number]       Height:       63.0 in Accession #:     [id number]      Weight:       146.4 lb Date of Birth:  1965-08-24       BSA:          1.694 m Patient Age:    57 years        BP:           164/92 mmHg Patient Gender: F               HR:           97 bpm. Exam Location:  Inpatient Procedure: Color Doppler, Cardiac Doppler, Saline Contrast Bubble Study,            Transesophageal Echo and 3D Echo Indications:     Stroke  History:         Patient has prior history of Echocardiogram examinations, most                  recent 01/12/2023. Stroke and CKD; Risk Factors:Diabetes,                  Hypertension and Dyslipidemia.  Sonographer:     Milbert Coulter Referring Phys:  [id number] Abagail Kitchens Diagnosing Phys: Riley Lam MD PROCEDURE: After discussion of the risks  and benefits of a TEE, an informed consent was obtained from the patient. The transesophogeal probe was passed without difficulty through the esophogus of the patient. Imaged were obtained with the patient in a left lateral decubitus position. Sedation performed by different physician. The patient was monitored while under deep sedation. Anesthestetic sedation was provided intravenously by Anesthesiology: 297.01mg  of Propofol, 100mg  of Lidocaine. Image quality was adequate. The patient's vital signs; including heart rate, blood pressure, and oxygen saturation; remained stable throughout the procedure. The patient developed no complications during the procedure.  IMPRESSIONS  1. Left ventricular ejection fraction, by estimation, is 65 to 70%. The left ventricle has normal function. There is severe concentric left ventricular hypertrophy.  2. Right ventricular systolic function is normal. The right ventricular size is normal. Mildly increased right ventricular wall thickness.  3. Appendage assessment imaging did not save to study images. No left atrial/left atrial appendage thrombus was detected.  4. A small pericardial effusion is present. The pericardial effusion is circumferential.  5. Billowing  without proplapse. The mitral valve is grossly normal. No evidence of mitral valve regurgitation. No evidence of mitral stenosis.  6. The aortic valve is tricuspid. Aortic valve regurgitation is not visualized. No aortic stenosis is present.  7. The inferior vena cava is normal in size with greater than 50% respiratory variability, suggesting right atrial pressure of 3 mmHg. FINDINGS  Left Ventricle: Left ventricular ejection fraction, by estimation, is 65 to 70%. The left ventricle has normal function. The left ventricular internal cavity size was normal in size. There is severe concentric left ventricular hypertrophy. Right Ventricle: The right ventricular size is normal. Mildly increased right ventricular wall thickness. Right ventricular systolic function is normal. Left Atrium: Appendage assessment imaging did not save to study images. Left atrial size was normal in size. No left atrial/left atrial appendage thrombus was detected. Right Atrium: Right atrial size was normal in size. Pericardium: A small pericardial effusion is present. The pericardial effusion is circumferential. Mitral Valve: Billowing without proplapse. The mitral valve is grossly normal. No evidence of mitral valve regurgitation. No evidence of mitral valve stenosis. Tricuspid Valve: The tricuspid valve is normal in structure. Tricuspid valve regurgitation is trivial. Aortic Valve: The aortic valve is tricuspid. Aortic valve regurgitation is not visualized. No aortic stenosis is present. Pulmonic Valve: The pulmonic valve was normal in structure. Pulmonic valve regurgitation is trivial. No evidence of pulmonic stenosis. Aorta: The aortic root, ascending aorta, aortic arch and descending aorta are all structurally normal, with no evidence of dilitation or obstruction. Venous: The inferior vena cava is normal in size with greater than 50% respiratory variability, suggesting right atrial pressure of 3 mmHg. IAS/Shunts: The interatrial septum  appears to be lipomatous. No atrial level shunt detected by color flow Doppler. Agitated saline contrast was given intravenously to evaluate for intracardiac shunting. Additional Comments: Spectral Doppler performed. Riley Lam MD Electronically signed by Riley Lam MD Signature Date/Time: 01/13/2023/11:25:08 AM    Final    EP STUDY  Result Date: 01/13/2023 See surgical note for result.   Vitals:   01/13/23 1127 01/13/23 1130 01/13/23 1140 01/13/23 1249  BP: 116/78 121/69 128/79 (!) 164/104  Pulse: 68 63 81 74  Resp: (!) 26 17 (!) 21 18  Temp: 97.8 F (36.6 C)   98.8 F (37.1 C)  TempSrc: Temporal   Oral  SpO2: 99% 100% 97% 98%  Weight:      Height:         PHYSICAL EXAM General:  Alert, well-nourished, well-developed patient in no acute distress Psych:  Mood and affect appropriate for situation CV: Regular rate and rhythm on monitor Respiratory:  Regular, unlabored respirations on room air GI: Abdomen soft and nontender   NEURO:  Mental Status: AA&Ox3, patient is able to give clear and coherent history Speech/Language: speech is without dysarthria or aphasia.    Cranial Nerves:  II: PERRL. Visual fields full.  III, IV, VI: EOMI. Eyelids elevate symmetrically.  V: Sensation is intact to light touch and symmetrical to face.  VII: Slight right facial droop VIII: hearing intact to voice. IX, X: Phonation is normal.  ZO:XWRUEAVW shrug 5/5. XII: tongue is midline without fasciculations. Motor: 5/5 strength to all muscle groups tested.  Tone: is normal and bulk is normal Sensation- Intact to light touch bilaterally.   Coordination: FTN intact bilaterally.No drift.  Gait- deferred   ASSESSMENT/PLAN  Acute Ischemic Infarct:  left frontal white matter acute infarct, etiology: Uncertain at this time, most likely small vessel disease MRI small acute infarct in left frontal white matter MRA no LVO or hemodynamically significant stenosis Carotid Doppler  unremarkable 2D Echo EF 55 to 60% TCD bubble study no PFO TEE lipomatous interatrial septal hypertrophy without evidence of PFO, negative bubble study, severe LVH with small pericardial effusion and papillary muscle hypertrophy 30-day cardiac monitor as outpt LDL 29 HgbA1c 7.5 UDS positive for THC VTE prophylaxis -Lovenox aspirin 81 mg daily prior to admission, now on aspirin 81 mg daily and clopidogrel 75 mg daily for 3 weeks and then Plavix alone. Therapy recommendations:  Outpatient PT/OT Disposition: Pending  Hypertension Home meds: Atenolol 100 mg daily, Benzeapril 40 mg twice daily Stable on the high end Long-term BP goal normotensive  Hyperlipidemia Home meds: Rosuvastatin 40 mg daily, resumed in hospital LDL 29, goal < 70 Continue statin at discharge  Diabetes type II Uncontrolled Home meds:  Farxiga 10 mg daily, Ozempic 1 mg weekly, insulin glargine 10 units daily HgbA1c 7.5, goal < 7.0 CBGs SSI Recommend close follow-up with PCP for better DM control  Tobacco Abuse Patient smokes  Patient is willing to quit Patient states she will request nicotine replacement therapy if needed  Substance Abuse UDS positive for THC  Will discuss cessation with patient  Other Stroke Risk Factors None  Other Active Problems History of renal transplant-continue home Prograf, prednisone and CellCept, creatinine 2.11 Mild leukocytosis WBC 10.7  Hospital day # 2  Patient seen by NP and then by MD, MD to edit note as needed. Cortney E Ernestina Columbia , MSN, AGACNP-BC [facility] See Amion for schedule and pager information 01/13/2023 2:33 PM   ATTENDING NOTE: I reviewed above note and agree with the assessment and plan. Pt was seen and examined.   No acute event overnight.  Neuro stable.  TEE done showed no LV thrombus or cardioembolic source but severe LVH, indicating uncontrolled hypertension.  Aggressive risk factor modification education provided.  Recommend  30-day CardioNet monitor as outpatient to rule out A-fib.  Continue DAPT and statin.  PT and OT outpatient.  For detailed assessment and plan, please refer to above/below as I have made changes wherever appropriate.   Neurology will sign off. Please call with questions. Pt will follow up with stroke clinic NP at [facility] in about 4 weeks. Thanks for the consult.   Marvel Plan, MD PhD Stroke Neurology 01/13/2023 7:19 PM    To contact Stroke Continuity provider, please refer to [email or web address]. After hours, contact General Neurology

## 2023-01-13 NOTE — Progress Notes (Signed)
SLP Cancellation Note  Patient Details Name: Isabella Obrien MRN: [id number] DOB: [birth year]   Cancelled treatment:       Reason Eval/Treat Not Completed: Patient at procedure or test/unavailable    Mahala Menghini., M.A. CCC-SLP Acute Rehabilitation Services Office [phone]  Secure chat preferred  01/13/2023, 10:30 AM

## 2023-01-13 NOTE — Interval H&P Note (Signed)
History and Physical Interval Note:  01/13/2023 10:20 AM  Isabella Obrien  has presented today for surgery, with the diagnosis of stroke.  The various methods of treatment have been discussed with the patient and family. After consideration of risks, benefits and other options for treatment, the patient has consented to  Procedure(s): TRANSESOPHAGEAL ECHOCARDIOGRAM (N/A) as a surgical intervention.  The patient's history has been reviewed, patient examined, no change in status, stable for surgery.  I have reviewed the patient's chart and labs.  Questions were answered to the patient's satisfaction.     Dewane Timson A Tanisha Lutes

## 2023-01-13 NOTE — CV Procedure (Signed)
    TRANSESOPHAGEAL ECHOCARDIOGRAM   NAME:  Isabella Obrien    MRN: [id number] DOB:  02-Feb-1966    ADMIT DATE: 01/11/2023  INDICATIONS: Stroke  PROCEDURE:   Informed consent was obtained prior to the procedure. The risks, benefits and alternatives for the procedure were discussed and the patient comprehended these risks.  Risks include, but are not limited to, cough, sore throat, vomiting, nausea, somnolence, esophageal and stomach trauma or perforation, bleeding, low blood pressure, aspiration, pneumonia, infection, trauma to the teeth and death.    Procedural time out performed. The oropharynx was anesthetized with topical 1% benzocaine.    Anesthesia was administered by Dr. Desmond Lope and team.  The patient's heart rate, blood pressure, and oxygen saturation are monitored continuously during the procedure.  The transesophageal probe was inserted in the esophagus and stomach without difficulty and multiple views were obtained.   COMPLICATIONS:    There were no immediate complications.  KEY FINDINGS:  Lipomatous interatrial septal hypertrophy without evidence of PFO; negative bubble study. Severe left ventricular hypertrophy with small pericardial effusion and papillary muscle hypertrophy.  Full report to follow. Further management per primary team.   Riley Lam, MD [place]  [facility]  11:11 AM

## 2023-01-13 NOTE — Progress Notes (Signed)
AVS discussed and explained to the patient. Medications, appointments and discharge instructions explained.

## 2023-01-13 NOTE — Discharge Summary (Signed)
Physician Discharge Summary  Isabella Obrien PIR:[id number] DOB: 30-Oct-1965 DOA: 01/11/2023  PCP: Aliene Beams, MD  Admit date: 01/11/2023 Discharge date: 01/13/2023 Recommendations for Outpatient Follow-up:  Follow up with PCP in 1 weeks-call for appointment Follow-up with neurology as outpatient Please obtain BMP/CBC in one week  Discharge Dispo: Home Discharge Condition: Stable Code Status:   Code Status: Full Code Diet recommendation:  Diet Order             Diet Carb Modified Fluid consistency: Nectar Thick; Room service appropriate? Yes  Diet effective now                    Brief/Interim Summary: 57 y.o.f w/ hypertension, hyperlipidemia, insulin-dependent type 2 diabetes, status post kidney transplant for FSGS being admitted to the hospital with acute CVA. On 8/19 around 8:20 PM, when she was having a smoothie with her husband, felt like the left side of her face was a little bit numb, felt awkward to swallow, and husband noticed that her speech was a little bit slurred.  He feels like it has improved a little bit overnight, but is still not completely normal. She states that through the night, she could tell that she needed to concentrate a little bit extra in order to swallow, but was not choking. ED Course:afebrile, blood pressure little bit on the high side but otherwise vital signs are unremarkable.  Lab work shows mild AKI creatinine 2.11, otherwise relatively unremarkable.  MRI of the brain was done, which as detailed below shows evidence of acute infarction in the left frontal white matter.  Hospitalist was contacted for admission, ER provider discussed with neurology who recommended admission to [facility] for formal neurology consultation. MRI showed acute left frontal infarct.Ldl at goal 29, A1c 7.5 .DOING WELL and neuro intact. S/p TEE 8/22>Negative bubble study, no LAA thrombus seen, normal function. Appendage images did not save to echo. Severe LVH; suspect related  to long standing HTN . PER Neuro- consider 30 day cardiac monitor. Cont asa+ plavix x 3 wk then plavix alone, cont statins. She is mobilizing well ambulating w/ no weakness.  Plan for discharge home    Discharge Diagnoses:  Principal Problem:   Acute CVA (cerebrovascular accident) (HCC)  Acute left frontal infarct: Continue to complete stroke workup so far MR angio head no acute finding MRI showed acute left frontal infarct.Ldl at goal 29, A1c 7.5 .DOING WELL and neuro intact. S/p TEE 8/22>Negative bubble study, no LAA thrombus seen, normal function. Appendage images did not save to echo. Severe LVH; suspect related to long standing HTN . PER Neuro- consider 30 day cardiac monitor. Cont asa+ plavix x 3 wk then plavix alone, cont statins.  I discussed with neurology today and okay for discharge home. Send msg to cardio for 30 day cardiac monitor. She is mobilizing well ambulating w/ no weakness.  Plan for discharge home Continue PT OT speech eval, monitor neurocheck per stroke protocol.   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  Monitor renal function closely, continue home immunosuppressants Recent Labs  Lab 01/11/23 1000 01/12/23 0734 01/13/23 0312  BUN 30* 25* 29*  CREATININE 2.11* 2.16* 2.28*      HTN: Allow permissive hypertension for 24 to 48 hours   Type 2 diabetes mellitus: A1c stable continue home [place] and other medication   UDS: THC+  HLD lipid panel at  goal,cont her Crestor.  Consults: Cardiology, Neurology Subjective: Alert awake oriented resting comfortably no complaints.  Discharge Exam: Vitals:   01/13/23 1140 01/13/23 1249  BP: 128/79 (!) 164/104  Pulse: 81 74  Resp: (!) 21 18  Temp:  98.8 F (37.1 C)  SpO2: 97% 98%   General: Pt is alert, awake, not in acute distress Cardiovascular: RRR, S1/S2 +, no rubs, no gallops Respiratory: CTA  bilaterally, no wheezing, no rhonchi Abdominal: Soft, NT, ND, bowel sounds + Extremities: no edema, no cyanosis  Discharge Instructions  Discharge Instructions     Ambulatory referral to Neurology   Complete by: As directed    Follow up with stroke clinic NP at [facility] in about 4 weeks. Thanks.   Ambulatory referral to Speech Therapy   Complete by: As directed    Discharge instructions   Complete by: As directed    Please call call MD or return to ER for similar or worsening recurring problem that brought you to hospital or if any fever,nausea/vomiting,abdominal pain, uncontrolled pain, chest pain,  shortness of breath or any other alarming symptoms.  Please follow-up your doctor as instructed in a week time and call the office for appointment.  Please avoid alcohol, smoking, or any other illicit substance and maintain healthy habits including taking your regular medications as prescribed.  You were cared for by a hospitalist during your hospital stay. If you have any questions about your discharge medications or the care you received while you were in the hospital after you are discharged, you can call the unit and ask to speak with the hospitalist on call if the hospitalist that took care of you is not available.  Once you are discharged, your primary care physician will handle any further medical issues. Please note that NO REFILLS for any discharge medications will be authorized once you are discharged, as it is imperative that you return to your primary care physician (or establish a relationship with a primary care physician if you do not have one) for your aftercare needs so that they can reassess your need for medications and monitor your lab values   Increase activity slowly   Complete by: As directed       Allergies as of 01/13/2023       Reactions   Cefuroxime Swelling, Other (See Comments)   Ceftin   Benadryl [diphenhydramine Hcl] Swelling, Anxiety, Other (See Comments)    Hyperactivity and angioedema, also   Tape Rash, Other (See Comments)   Paper tape is okay   Wound Dressing Adhesive Rash, Other (See Comments)   Paper tape is ok        Medication List     STOP taking these medications    atorvastatin 40 MG tablet Commonly known as: LIPITOR   Basaglar KwikPen 100 UNIT/ML       TAKE these medications    Accu-Chek FastClix Lancets Misc USE AS DIRECTED UP TO FOUR TIMES DAILY   Accu-Chek Guide test strip Generic drug: glucose blood USE TO TEST AS DIRECTED UP TO 4 TIMES DAILY   albuterol 108 (90 Base) MCG/ACT inhaler Commonly known as: VENTOLIN HFA Inhale 2 puffs into the lungs every 6 (six) hours as needed for wheezing or shortness of breath.   albuterol (2.5 MG/3ML) 0.083% nebulizer solution Commonly known as: PROVENTIL Take 3 mLs (2.5 mg total) by nebulization 2 (two) times daily as needed for wheezing or shortness of breath.   aspirin EC 81 MG tablet Take 1 tablet (81 mg  total) by mouth at bedtime for 21 days.   atenolol 100 MG tablet Commonly known as: TENORMIN Take 100 mg by mouth at bedtime.   benazepril 40 MG tablet Commonly known as: LOTENSIN Take 40 mg by mouth in the morning and at bedtime.   blood glucose meter kit and supplies Kit Dispense based on patient and insurance preference. Use up to four times daily as directed.   clopidogrel 75 MG tablet Commonly known as: PLAVIX Take 1 tablet (75 mg total) by mouth daily. Start taking on: January 14, 2023   escitalopram 10 MG tablet Commonly known as: LEXAPRO Take 10 mg by mouth at bedtime.   Farxiga 10 MG Tabs tablet Generic drug: dapagliflozin propanediol Take 10 mg by mouth in the morning.   Fish Oil 1000 MG Caps Take 1,000 mg by mouth in the morning and at bedtime.   furosemide 40 MG tablet Commonly known as: LASIX Take 1 tablet (40 mg total) by mouth daily as needed. What changed: reasons to take this   Insulin Pen Needle 32G X 4 MM Misc 50 each by Does  not apply route in the morning and at bedtime.   ipratropium 0.02 % nebulizer solution Commonly known as: ATROVENT Take 2.5 mLs (0.5 mg total) by nebulization 2 (two) times daily as needed for wheezing or shortness of breath.   Kerendia 20 MG Tabs Generic drug: Finerenone Take 20 mg by mouth in the morning.   mycophenolate 500 MG tablet Commonly known as: CELLCEPT Take 500 mg by mouth in the morning and at bedtime.   Ozempic (1 MG/DOSE) 4 MG/3ML Sopn Generic drug: Semaglutide (1 MG/DOSE) Inject 1 mg into the skin every Wednesday.   predniSONE 5 MG tablet Commonly known as: DELTASONE Take 5 mg by mouth daily with breakfast.   rosuvastatin 40 MG tablet Commonly known as: CRESTOR Take 1 tablet (40 mg total) by mouth at bedtime.   sodium bicarbonate 650 MG tablet Take 1,300 mg by mouth 3 (three) times daily.   tacrolimus 1 MG capsule Commonly known as: PROGRAF Take 1 mg by mouth in the morning and at bedtime.   Evaristo Bury FlexTouch 100 UNIT/ML FlexTouch Pen Generic drug: insulin degludec Inject 13 Units into the skin at bedtime.   VISINE ADVANCED RELIEF OP Place 1 drop into both eyes 3 (three) times daily as needed (for dryness).   zolpidem 10 MG tablet Commonly known as: AMBIEN Take 10 mg by mouth at bedtime as needed for sleep.        Follow-up Information     [facility]. Schedule an appointment as soon as possible for a visit.   Specialty: Rehabilitation Contact information: [address] [place] [address] [phone]        Aliene Beams, MD Follow up in 1 week(s).   Specialty: Family Medicine Contact information: [phone] [address] [place] [address] [phone]         [facility]. Schedule an appointment as soon as possible for a visit in 1 month(s).   Specialty: Neurology Why: stroke clinic Contact information: [address]  [address] [place] [address] [phone]               Allergies  Allergen Reactions   Cefuroxime Swelling and Other (See Comments)    Ceftin   Benadryl [Diphenhydramine Hcl] Swelling, Anxiety and Other (See Comments)    Hyperactivity and angioedema, also  Tape Rash and Other (See Comments)    Paper tape is okay   Wound Dressing Adhesive Rash and Other (See Comments)    Paper tape is ok    The results of significant diagnostics from this hospitalization (including imaging, microbiology, ancillary and laboratory) are listed below for reference.    Microbiology: No results found for this or any previous visit (from the past 240 hour(s)).  Procedures/Studies: ECHO TEE  Result Date: 01/13/2023    TRANSESOPHOGEAL ECHO REPORT   Patient Name:   LAQUETTA DELISE Date of Exam: 01/13/2023 Medical Rec #:  [id number]       Height:       63.0 in Accession #:    [id number]      Weight:       146.4 lb Date of Birth:  [birth year]       BSA:          1.694 m Patient Age:    57 years        BP:           164/92 mmHg Patient Gender: F               HR:           97 bpm. Exam Location:  Inpatient Procedure: Color Doppler, Cardiac Doppler, Saline Contrast Bubble Study,            Transesophageal Echo and 3D Echo Indications:     Stroke  History:         Patient has prior history of Echocardiogram examinations, most                  recent 01/12/2023. Stroke and CKD; Risk Factors:Diabetes,                  Hypertension and Dyslipidemia.  Sonographer:     Milbert Coulter Referring Phys:  [id number] Minda Meo HALEY Diagnosing Phys: Riley Lam MD PROCEDURE: After discussion of the risks and benefits of a TEE, an informed consent was obtained from the patient. The transesophogeal probe was passed without difficulty through the esophogus of the patient. Imaged were obtained with the patient in a left lateral decubitus position. Sedation performed by different physician. The patient was monitored  while under deep sedation. Anesthestetic sedation was provided intravenously by Anesthesiology: 297.01mg  of Propofol, 100mg  of Lidocaine. Image quality was adequate. The patient's vital signs; including heart rate, blood pressure, and oxygen saturation; remained stable throughout the procedure. The patient developed no complications during the procedure.  IMPRESSIONS  1. Left ventricular ejection fraction, by estimation, is 65 to 70%. The left ventricle has normal function. There is severe concentric left ventricular hypertrophy.  2. Right ventricular systolic function is normal. The right ventricular size is normal. Mildly increased right ventricular wall thickness.  3. Appendage assessment imaging did not save to study images. No left atrial/left atrial appendage thrombus was detected.  4. A small pericardial effusion is present. The pericardial effusion is circumferential.  5. Billowing without proplapse. The mitral valve is grossly normal. No evidence of mitral valve regurgitation. No evidence of mitral stenosis.  6. The aortic valve is tricuspid. Aortic valve regurgitation is not visualized. No aortic stenosis is present.  7. The inferior vena cava is normal in size with greater than 50% respiratory variability, suggesting right atrial pressure of 3 mmHg. FINDINGS  Left Ventricle: Left ventricular ejection fraction, by estimation, is 65 to 70%. The left ventricle has normal function. The left ventricular internal  cavity size was normal in size. There is severe concentric left ventricular hypertrophy. Right Ventricle: The right ventricular size is normal. Mildly increased right ventricular wall thickness. Right ventricular systolic function is normal. Left Atrium: Appendage assessment imaging did not save to study images. Left atrial size was normal in size. No left atrial/left atrial appendage thrombus was detected. Right Atrium: Right atrial size was normal in size. Pericardium: A small pericardial effusion is  present. The pericardial effusion is circumferential. Mitral Valve: Billowing without proplapse. The mitral valve is grossly normal. No evidence of mitral valve regurgitation. No evidence of mitral valve stenosis. Tricuspid Valve: The tricuspid valve is normal in structure. Tricuspid valve regurgitation is trivial. Aortic Valve: The aortic valve is tricuspid. Aortic valve regurgitation is not visualized. No aortic stenosis is present. Pulmonic Valve: The pulmonic valve was normal in structure. Pulmonic valve regurgitation is trivial. No evidence of pulmonic stenosis. Aorta: The aortic root, ascending aorta, aortic arch and descending aorta are all structurally normal, with no evidence of dilitation or obstruction. Venous: The inferior vena cava is normal in size with greater than 50% respiratory variability, suggesting right atrial pressure of 3 mmHg. IAS/Shunts: The interatrial septum appears to be lipomatous. No atrial level shunt detected by color flow Doppler. Agitated saline contrast was given intravenously to evaluate for intracardiac shunting. Additional Comments: Spectral Doppler performed. Riley Lam MD Electronically signed by Riley Lam MD Signature Date/Time: 01/13/2023/11:25:08 AM    Final    EP STUDY  Result Date: 01/13/2023 See surgical note for result.  VAS [place] TRANSCRANIAL DOPPLER W BUBBLES  Result Date: 01/12/2023  Transcranial Doppler with Bubble Patient Name:  AMAYRA KANODE  Date of Exam:   01/12/2023 Medical Rec #: [id number]        Accession #:    [id number] Date of Birth: 1966/04/10        Patient Gender: F Patient Age:   38 years Exam Location:  [facility] Procedure:      VAS [place] TRANSCRANIAL DOPPLER W BUBBLES Referring Phys: Scheryl Marten XU --------------------------------------------------------------------------------  Indications: Stroke. Comparison Study: No prior studies. Performing Technologist: Jean Rosenthal RDMS, RVT  Examination Guidelines: A complete  evaluation includes B-mode imaging, spectral Doppler, color Doppler, and power Doppler as needed of all accessible portions of each vessel. Bilateral testing is considered an integral part of a complete examination. Limited examinations for reoccurring indications may be performed as noted.  Summary: No HITS at rest or during Valsalva. Negative transcranial Doppler Bubble study with no evidence of right to left intracardiac communication.  A vascular evaluation was performed. The left middle cerebral artery was studied. An IV was inserted into the patient's right forearm. Verbal informed consent was obtained.  *See table(s) above for TCD measurements and observations.  Diagnosing physician: Marvel Plan MD Electronically signed by Marvel Plan MD on 01/12/2023 at 7:22:29 PM.    Final    ECHOCARDIOGRAM COMPLETE BUBBLE STUDY  Result Date: 01/12/2023    ECHOCARDIOGRAM REPORT   Patient Name:   PENELLOPE KILSON Date of Exam: 01/12/2023 Medical Rec #:  [id number]       Height:       63.0 in Accession #:    [id number]      Weight:       146.4 lb Date of Birth:  September 19, 1965       BSA:          1.694 m Patient Age:    57 years        BP:  142/100 mmHg Patient Gender: F               HR:           62 bpm. Exam Location:  Inpatient Procedure: 2D Echo, Color Doppler, Cardiac Doppler and Saline Contrast Bubble            Study Indications:    Stroke  History:        Patient has no prior history of Echocardiogram examinations.                 Stroke and CKD; Risk Factors:Diabetes, Hypertension and                 Dyslipidemia.  Sonographer:    Milbert Coulter Referring Phys: [id number] MIR M [facility] IMPRESSIONS  1. Left ventricular ejection fraction, by estimation, is 55 to 60%. The left ventricle has normal function. The left ventricle has no regional wall motion abnormalities. There is moderate concentric left ventricular hypertrophy. Left ventricular diastolic parameters are consistent with Grade I diastolic dysfunction  (impaired relaxation).  2. Right ventricular systolic function is normal. The right ventricular size is normal. Tricuspid regurgitation signal is inadequate for assessing PA pressure.  3. The mitral valve is grossly normal. Trivial mitral valve regurgitation. No evidence of mitral stenosis.  4. The aortic valve is tricuspid. Aortic valve regurgitation is not visualized. No aortic stenosis is present.  5. The inferior vena cava is normal in size with greater than 50% respiratory variability, suggesting right atrial pressure of 3 mmHg.  6. Agitated saline contrast bubble study was negative, with no evidence of any interatrial shunt. FINDINGS  Left Ventricle: Left ventricular ejection fraction, by estimation, is 55 to 60%. The left ventricle has normal function. The left ventricle has no regional wall motion abnormalities. The left ventricular internal cavity size was normal in size. There is  moderate concentric left ventricular hypertrophy. Left ventricular diastolic parameters are consistent with Grade I diastolic dysfunction (impaired relaxation). Right Ventricle: The right ventricular size is normal. No increase in right ventricular wall thickness. Right ventricular systolic function is normal. Tricuspid regurgitation signal is inadequate for assessing PA pressure. Left Atrium: Left atrial size was normal in size. Right Atrium: Right atrial size was normal in size. Pericardium: Trivial pericardial effusion is present. Presence of epicardial fat layer. Mitral Valve: The mitral valve is grossly normal. Trivial mitral valve regurgitation. No evidence of mitral valve stenosis. Tricuspid Valve: The tricuspid valve is grossly normal. Tricuspid valve regurgitation is trivial. No evidence of tricuspid stenosis. Aortic Valve: The aortic valve is tricuspid. Aortic valve regurgitation is not visualized. No aortic stenosis is present. Aortic valve mean gradient measures 6.0 mmHg. Aortic valve peak gradient measures 11.8 mmHg.  Aortic valve area, by VTI measures 2.00  cm. Pulmonic Valve: The pulmonic valve was grossly normal. Pulmonic valve regurgitation is trivial. No evidence of pulmonic stenosis. Aorta: The aortic root and ascending aorta are structurally normal, with no evidence of dilitation. Venous: The inferior vena cava is normal in size with greater than 50% respiratory variability, suggesting right atrial pressure of 3 mmHg. IAS/Shunts: The atrial septum is grossly normal. Agitated saline contrast was given intravenously to evaluate for intracardiac shunting. Agitated saline contrast bubble study was negative, with no evidence of any interatrial shunt.  LEFT VENTRICLE PLAX 2D LVIDd:         4.20 cm   Diastology LVIDs:         2.60 cm   LV e' medial:  6.20 cm/s LV PW:         1.40 cm   LV E/e' medial:  10.0 LV IVS:        1.40 cm   LV e' lateral:   7.29 cm/s LVOT diam:     1.90 cm   LV E/e' lateral: 8.5 LV SV:         73 LV SV Index:   43 LVOT Area:     2.84 cm  RIGHT VENTRICLE RV Basal diam:  2.50 cm RV Mid diam:    1.50 cm RV S prime:     14.60 cm/s TAPSE (M-mode): 1.6 cm LEFT ATRIUM             Index        RIGHT ATRIUM           Index LA diam:        3.20 cm 1.89 cm/m   RA Area:     10.90 cm LA Vol (A2C):   36.1 ml 21.32 ml/m  RA Volume:   21.60 ml  12.75 ml/m LA Vol (A4C):   31.2 ml 18.42 ml/m LA Biplane Vol: 35.5 ml 20.96 ml/m  AORTIC VALVE AV Area (Vmax):    2.13 cm AV Area (Vmean):   1.99 cm AV Area (VTI):     2.00 cm AV Vmax:           172.00 cm/s AV Vmean:          112.000 cm/s AV VTI:            0.364 m AV Peak Grad:      11.8 mmHg AV Mean Grad:      6.0 mmHg LVOT Vmax:         129.00 cm/s LVOT Vmean:        78.700 cm/s LVOT VTI:          0.257 m LVOT/AV VTI ratio: 0.71  AORTA Ao Root diam: 2.70 cm Ao Asc diam:  3.00 cm MITRAL VALVE MV Area (PHT): 3.89 cm    SHUNTS MV Decel Time: 195 msec    Systemic VTI:  0.26 m MV E velocity: 62.30 cm/s  Systemic Diam: 1.90 cm MV A velocity: 77.50 cm/s MV E/A ratio:   0.80 Lennie Odor MD Electronically signed by Lennie Odor MD Signature Date/Time: 01/12/2023/5:30:33 PM    Final    VAS US CAROTID  Result Date: 01/12/2023 Carotid Arterial Duplex Study Patient Name:  SANTERIA BUFFO  Date of Exam:   01/12/2023 Medical Rec #: [id number]        Accession #:    [id number] Date of Birth: [birth year]        Patient Gender: F Patient Age:   8 years Exam [facility] Procedure:      VAS US CAROTID Referring Phys: Milon Dikes --------------------------------------------------------------------------------  Indications:  CVA. Risk Factors: Hypertension, hyperlipidemia, Diabetes. Performing Technologist: Jean Rosenthal RDMS, RVT  Examination Guidelines: A complete evaluation includes B-mode imaging, spectral Doppler, color Doppler, and power Doppler as needed of all accessible portions of each vessel. Bilateral testing is considered an integral part of a complete examination. Limited examinations for reoccurring indications may be performed as noted.  Right Carotid Findings: +----------+--------+--------+--------+---------------------+------------------+           PSV cm/sEDV cm/sStenosisPlaque Description   Comments           +----------+--------+--------+--------+---------------------+------------------+ CCA Prox  86      11                                                      +----------+--------+--------+--------+---------------------+------------------+  CCA Distal76      18                                   intimal thickening +----------+--------+--------+--------+---------------------+------------------+ ICA Prox  47      12      1-39%   hyperechoic and                                                           smooth , mild                           +----------+--------+--------+--------+---------------------+------------------+ ICA Distal73      28                                                       +----------+--------+--------+--------+---------------------+------------------+ ECA       96      13                                                      +----------+--------+--------+--------+---------------------+------------------+ +----------+--------+-------+----------------+-------------------+           PSV cm/sEDV cmsDescribe        Arm Pressure (mmHG) +----------+--------+-------+----------------+-------------------+ [id number]            Multiphasic, WNL                    +----------+--------+-------+----------------+-------------------+ +---------+--------+--+--------+-+---------+ VertebralPSV cm/s34EDV cm/s8Antegrade +---------+--------+--+--------+-+---------+  Left Carotid Findings: +----------+--------+--------+--------+---------------------+------------------+           PSV cm/sEDV cm/sStenosisPlaque Description   Comments           +----------+--------+--------+--------+---------------------+------------------+ CCA Prox  102     17                                                      +----------+--------+--------+--------+---------------------+------------------+ CCA Distal67      16                                   intimal thickening +----------+--------+--------+--------+---------------------+------------------+ ICA Prox  63      14      1-39%   hyperechoic and focal                                                     , mild                                  +----------+--------+--------+--------+---------------------+------------------+ ICA Distal78  29                                                      +----------+--------+--------+--------+---------------------+------------------+ ECA       91      11                                                      +----------+--------+--------+--------+---------------------+------------------+ +----------+--------+--------+----------------+-------------------+            PSV cm/sEDV cm/sDescribe        Arm Pressure (mmHG) +----------+--------+--------+----------------+-------------------+ Subclavian118             Multiphasic, WNL                    +----------+--------+--------+----------------+-------------------+ +---------+--------+--+--------+--+---------+ VertebralPSV cm/s33EDV cm/s10Antegrade +---------+--------+--+--------+--+---------+   Summary: Right Carotid: Velocities in the right ICA are consistent with a 1-39% stenosis. Left Carotid: Velocities in the left ICA are consistent with a 1-39% stenosis. Vertebrals:  Bilateral vertebral arteries demonstrate antegrade flow. Subclavians: Normal flow hemodynamics were seen in bilateral subclavian              arteries. *See table(s) above for measurements and observations.     Preliminary    MR ANGIO HEAD WO CONTRAST  Result Date: 01/12/2023 CLINICAL DATA:  Acute left frontal infarct and diminished left MCA flow void on MRI EXAM: MRA HEAD WITHOUT CONTRAST TECHNIQUE: Angiographic images of the Circle of Willis were acquired using MRA technique without intravenous contrast. COMPARISON:  No prior MRA available, correlation is made with MRI head 01/11/2023 FINDINGS: MRA HEAD FINDINGS Anterior circulation: Both internal carotid arteries are patent to the termini, without significant stenosis. A1 segments patent, hypoplastic on the left. Normal anterior communicating artery. Anterior cerebral arteries are patent to their distal aspects without significant stenosis. No M1 stenosis or occlusion. Distal MCA branches perfused to their distal aspects without significant stenosis. Posterior circulation: Vertebral arteries patent to the vertebrobasilar junction without stenosis. Posterior inferior cerebral arteries patent bilaterally. Basilar patent to its distal aspect. Superior cerebellar arteries patent proximally. Patent P1 segments. PCAs perfused to their distal aspects without significant stenosis. The bilateral  posterior communicating arteries are patent, diminutive on the right. Anatomic variants: None significant IMPRESSION: No intracranial large vessel occlusion or hemodynamically significant stenosis. Electronically Signed   By: Wiliam Ke M.D.   On: 01/12/2023 04:12   MR BRAIN WO CONTRAST  Result Date: 01/11/2023 CLINICAL DATA:  Neuro deficit, acute, stroke suspected aphasia x 13 hours EXAM: MRI HEAD WITHOUT CONTRAST TECHNIQUE: Multiplanar, multiecho pulse sequences of the brain and surrounding structures were obtained without intravenous contrast. COMPARISON:  None Available. FINDINGS: Brain: Acute infarct in the left frontal white matter. Minimal associated edema without mass effect. No evidence of acute hemorrhage, mass lesion, midline shift or hydrocephalus. Vascular: Diminished left proximal MCA flow void. Other major arterial flow voids are maintained Skull and upper cervical spine: Normal marrow signal. Sinuses/Orbits: Clear sinuses.  No acute orbital findings. Other: No mastoid effusions. IMPRESSION: 1. Acute infarct in the left frontal white matter. 2. Diminished left proximal MCA flow void. This could be due to technique or secondary to significant stenosis. Recommend CTA or MRA to  further evaluate. Electronically Signed   By: Feliberto Harts M.D.   On: 01/11/2023 11:53    Labs: BNP (last 3 results) No results for input(s): "BNP" in the last 8760 hours. Basic Metabolic Panel: Recent Labs  Lab 01/11/23 1000 01/12/23 0734 01/13/23 0312  NA 135 138 140  K 4.1 3.8 3.6  CL 106 105 106  CO2 17* 22 23  GLUCOSE 91 104* 103*  BUN 30* 25* 29*  CREATININE 2.11* 2.16* 2.28*  CALCIUM 8.5* 8.8* 8.7*   Liver Function Tests: Recent Labs  Lab 01/11/23 1000  AST 20  ALT 21  ALKPHOS 84  BILITOT 0.8  PROT 6.2*  ALBUMIN 3.4*   No results for input(s): "LIPASE", "AMYLASE" in the last 168 hours. No results for input(s): "AMMONIA" in the last 168 hours. CBC: Recent Labs  Lab  01/11/23 1000 01/12/23 0734  WBC 10.7* 8.2  NEUTROABS 8.0*  --   HGB 15.6* 16.4*  HCT 49.5* 50.3*  MCV 93.4 90.6  PLT 193 198  CBG: Recent Labs  Lab 01/12/23 1154 01/12/23 1647 01/12/23 2108 01/13/23 0614 01/13/23 1251  GLUCAP 191* 132* 204* 124* 129*  D-Dimer No results for input(s): "DDIMER" in the last 72 hours. Hgb A1c Recent Labs    01/11/23 1400  HGBA1C 7.5*  Lipid Profile Recent Labs    01/12/23 0734  CHOL 112  HDL 30*  LDLCALC 29  TRIG 536*  CHOLHDL 3.7      Component Value Date/Time   COLORURINE STRAW (A) 01/11/2023 1353   APPEARANCEUR CLEAR 01/11/2023 1353   LABSPEC 1.006 01/11/2023 1353   PHURINE 7.0 01/11/2023 1353   GLUCOSEU >=500 (A) 01/11/2023 1353   HGBUR SMALL (A) 01/11/2023 1353   BILIRUBINUR NEGATIVE 01/11/2023 1353   BILIRUBINUR Negative 01/25/2020 1354   KETONESUR NEGATIVE 01/11/2023 1353   PROTEINUR 100 (A) 01/11/2023 1353   UROBILINOGEN 0.2 01/25/2020 1354   UROBILINOGEN 0.2 12/20/2013 1220   NITRITE NEGATIVE 01/11/2023 1353   LEUKOCYTESUR NEGATIVE 01/11/2023 1353   Sepsis Labs Recent Labs  Lab 01/11/23 1000 01/12/23 0734  WBC 10.7* 8.2   Microbiology No results found for this or any previous visit (from the past 240 hour(s)).   Time coordinating discharge: 35 minutes  SIGNED: Lanae Boast, MD  [facility] 01/13/2023, 1:28 PM  If 7PM-7AM, please contact night-coverage [email or web address]

## 2023-01-13 NOTE — TOC Transition Note (Signed)
Transition of Care [facility]) - CM/SW Discharge Note   Patient Details  Name: Isabella Obrien MRN: [id number] Date of Birth: May 02, 1966  Transition of Care [facility]) CM/SW Contact:  Kermit Balo, RN Phone Number: 01/13/2023, 2:11 PM   Clinical Narrative:     Pt is discharging home with outpatient ST through [facility] outpatient therapy.  Pt has transportation home.  Final next level of care: OP Rehab Barriers to Discharge: No Barriers Identified   Patient Goals and CMS Choice   Choice offered to / list presented to : Patient  Discharge Placement                         Discharge Plan and Services Additional resources added to the After Visit Summary for     Discharge Planning Services: CM Consult                                 Social Determinants of Health (SDOH) Interventions SDOH Screenings   Food Insecurity: No Food Insecurity (01/11/2023)  Housing: Low Risk  (01/11/2023)  Transportation Needs: No Transportation Needs (01/11/2023)  Utilities: Not At Risk (01/11/2023)  Depression (PHQ2-9): Low Risk  (11/03/2020)  Tobacco Use: High Risk (01/13/2023)     Readmission Risk Interventions     No data to display

## 2023-01-14 ENCOUNTER — Encounter (HOSPITAL_COMMUNITY): Payer: Self-pay | Admitting: Internal Medicine

## 2023-01-14 NOTE — Anesthesia Postprocedure Evaluation (Signed)
Anesthesia Post Note  Patient: Isabella Obrien  Procedure(s) Performed: TRANSESOPHAGEAL ECHOCARDIOGRAM     Patient location during evaluation: Cath Lab Anesthesia Type: MAC Level of consciousness: awake and alert Pain management: pain level controlled Vital Signs Assessment: post-procedure vital signs reviewed and stable Respiratory status: spontaneous breathing, nonlabored ventilation, respiratory function stable and patient connected to nasal cannula oxygen Cardiovascular status: stable and blood pressure returned to baseline Postop Assessment: no apparent nausea or vomiting Anesthetic complications: no   No notable events documented.  Last Vitals:  Vitals:   01/13/23 1140 01/13/23 1249  BP: 128/79 (!) 164/104  Pulse: 81 74  Resp: (!) 21 18  Temp:  37.1 C  SpO2: 97% 98%    Last Pain:  Vitals:   01/13/23 1249  TempSrc: Oral  PainSc: 0-No pain                 Collene Schlichter

## 2023-01-18 ENCOUNTER — Ambulatory Visit: Admission: RE | Admit: 2023-01-18 | Payer: BC Managed Care – PPO | Source: Ambulatory Visit

## 2023-01-18 DIAGNOSIS — Z940 Kidney transplant status: Secondary | ICD-10-CM

## 2023-01-21 DIAGNOSIS — I639 Cerebral infarction, unspecified: Secondary | ICD-10-CM

## 2023-02-01 ENCOUNTER — Other Ambulatory Visit: Payer: Self-pay

## 2023-02-01 ENCOUNTER — Ambulatory Visit: Payer: BC Managed Care – PPO | Attending: Internal Medicine

## 2023-02-01 DIAGNOSIS — R471 Dysarthria and anarthria: Secondary | ICD-10-CM | POA: Insufficient documentation

## 2023-02-01 DIAGNOSIS — I639 Cerebral infarction, unspecified: Secondary | ICD-10-CM | POA: Insufficient documentation

## 2023-02-01 DIAGNOSIS — R1310 Dysphagia, unspecified: Secondary | ICD-10-CM | POA: Insufficient documentation

## 2023-02-01 NOTE — Therapy (Signed)
OUTPATIENT SPEECH LANGUAGE PATHOLOGY EVALUATION   Patient Name: Isabella Obrien MRN: [id number] DOB:[birth year], 57 y.o., female Today's Date: 02/01/2023  PCP: Aliene Beams, MD REFERRING PROVIDER: Lanae Boast, MD  END OF SESSION:  End of Session - 02/01/23 1707     Visit Number 1    Number of Visits 1    Date for SLP Re-Evaluation 02/01/23    SLP Start Time 1619    SLP Stop Time  1653    SLP Time Calculation (min) 34 min    Activity Tolerance Patient tolerated treatment well             Past Medical History:  Diagnosis Date   Chronic kidney disease    has kidney transplant - having aphoresis now every other week   Diabetes mellitus without complication (HCC)    dm type 2 - fasting 150   History of blood product transfusion    Hyperlipidemia    Hypertension    Menopausal hot flushes 09/06/2017   Controlled on Lexapro by Dr. Rana Snare   Neuromuscular disorder [facility])    Past Surgical History:  Procedure Laterality Date   COLONOSCOPY     diateck catheter placement  09/2013   KIDNEY TRANSPLANT     ORIF ANKLE FRACTURE Left 2014   TEE WITHOUT CARDIOVERSION N/A 01/13/2023   Procedure: TRANSESOPHAGEAL ECHOCARDIOGRAM;  Surgeon: Christell Constant, MD;  Location: [facility] INVASIVE CV LAB;  Service: Cardiovascular;  Laterality: N/A;   Patient Active Problem List   Diagnosis Date Noted   Acute CVA (cerebrovascular accident) (HCC) 01/11/2023   Insomnia 02/02/2020   Internal hemorrhoids 12/20/2017   Menopausal hot flushes 09/06/2017   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   Combined hyperlipidemia associated with type 2 diabetes mellitus (HCC) 07/18/2013   Proteinuria 07/18/2013   Sciatica of right side 07/18/2013   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   CKD (chronic kidney disease) 07/18/2013   Hypertension due to kidney transplant 06/10/2011   History of living-donor kidney transplantation 04/19/2011   Long-term  use of immunosuppressant medication 04/19/2011    ONSET DATE:  01/11/23  REFERRING DIAG: I63.9 (ICD-10-CM) - Acute CVA (cerebrovascular accident) (HCC)  THERAPY DIAG:  Dysphagia, unspecified type  Dysarthria and anarthria  Rationale for Evaluation and Treatment: Rehabilitation  SUBJECTIVE:   SUBJECTIVE STATEMENT: "I couldn't (talk normally) last week but now it feels like it did before."  Pt accompanied by: self  PERTINENT HISTORY: Isabella Obrien is a 57 y.o. female adm to [facility] with dysarthria, and dysphagia. 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 Pt with PMH + for smoking, renal disease s/p renal transplant, "neuromuscular disorder".   PAIN:  Are you having pain? No  FALLS: Has patient fallen in last 6 months?  No  LIVING ENVIRONMENT: Lives with: lives with their spouse Lives in: House/apartment  PLOF:  Level of assistance: Independent with ADLs Employment: Full-time employment  PATIENT GOALS: Pt inquires if she needs skilled ST  OBJECTIVE:   DIAGNOSTIC FINDINGS:  MRI HEAD WITHOUT CONTRAST   TECHNIQUE: Multiplanar, multiecho pulse sequences of the brain and surrounding structures were obtained without intravenous contrast.   COMPARISON:  None Available.   FINDINGS: Brain: Acute infarct in the left frontal white matter. Minimal associated edema without mass effect. No evidence of acute hemorrhage, mass lesion, midline shift or hydrocephalus. Vascular: Diminished left proximal MCA flow void. Other major arterial flow voids are maintained Skull and upper cervical spine: Normal marrow signal Sinuses/Orbits: Clear  sinuses.  No acute orbital findings Other: No mastoid effusions. IMPRESSION: 1. Acute infarct in the left frontal white matter. 2. Diminished left proximal MCA flow void. This could be due to technique or secondary to significant stenosis. Recommend CTA or MRA to further evaluate.  COGNITION: Overall cognitive status: Within functional limits for  tasks assessed  AUDITORY COMPREHENSION: Overall auditory comprehension: Appears intact YES/NO questions: Appears intact Following directions: Appears intact Conversation: Moderately Complex and complex  READING COMPREHENSION: Intact  EXPRESSION: verbal  VERBAL EXPRESSION: Level of generative/spontaneous verbalization: conversation Repetition: Appears intact Pragmatics: Appears intact Comments: "I had slower time thinking of words when this first happened but that's gone now."  MOTOR SPEECH: Overall motor speech: Appears intact  ORAL MOTOR EXAMINATION: Overall status: WFL Comments: "I feel like that all is moving like it should."  CLINICAL SWALLOW ASSESSMENT:   Current diet: regular and thin liquids Dentition: adequate natural dentition Patient directly observed with POs: Yes: regular  Feeding: able to feed self Liquids provided by: cup Oral phase signs and symptoms:  none noted today Pharyngeal phase signs and symptoms:  none noted today Comments: Pt ate peanut butter crackers and drank water without any overt s/s of oral or pharyngeal deficits with swallowing. She stated her swallowing skills have slowly returned to the point that last week she only had difficulty swallowing when laying down, however the last 2-3 nights this returned to baseline as well.   TODAY'S TREATMENT:                                                                                                                                         DATE: n/a   PATIENT EDUCATION: Education details: CVA prevention Person educated: Patient Education method: Explanation Education comprehension: verbalized understanding   ASSESSMENT:  CLINICAL IMPRESSION: Patient "Isabella Obrien" is a 57 y.o. F who was seen today for assessment of speech and swallowing following a CVA in August 2024. Pt demonstrated WNL swallowing skills, and stated "My swallowing is fine now." Isabella Obrien demonstrated WNL speech function as well, endorsing that  her speech production was essentially back to baseline. "I couldn't say that last week but now it feels like it did before."  OBJECTIVE IMPAIRMENTS: include  none observed today .   PLAN:  SLP FREQUENCY: one time visit    Karson Reede, CCC-SLP 02/01/2023, 5:09 PM

## 2023-02-13 NOTE — Progress Notes (Unsigned)
PATIENT: Isabella Obrien DOB: [birth year]  REASON FOR VISIT: follow up HISTORY FROM: patient PRIMARY NEUROLOGIST: Dr. Pearlean Brownie  Chief Complaint  Patient presents with   Follow-up    Pt in 9 Pt here stroke f.u  Pt states no questions or concerns for today's visit      HISTORY OF PRESENT ILLNESS: Today 02/13/23  Isabella Obrien is a 57 y.o. female here for hospital follow-up for left frontal infarct.  Returns today for follow-up.  She feels that she is back to her baseline.  No additional strokelike symptoms.  Reports that her blood pressure was elevated this morning but typically is in normal range.  States that she has not taken her medicine this morning.  She has remained on aspirin and Plavix.  She is currently wearing a 30-day cardiac monitor.  Has an appointment with heart care on October 1.  She has never had a sleep study.  Reports that if she lays on her back she does feel like her throat is closing up.  Her and her husband sleep in separate rooms.  She has cut back on smoking.  Only smoking half a pack a day.  Previously was smoking 1-1/2 packs a day.  She is wearing nicotine patches  HISTORY [name] is a 57 y.o. female with history of CKD, kidney transplant, diabetes, hypertension, hyperlipidemia and tobacco use presenting with slurred speech and right facial droop.  Patient states that symptoms began sometime in the evening of 8/19, but that she initially attributed them to drinking a cold beverage.  The following day, her colleagues noted that her speech was off and noted her facial droop and directed her to seek medical assistance.  She was found to have a small acute infarct in left frontal white matter on MRI.  Acute Ischemic Infarct:  left frontal white matter acute infarct, etiology: Uncertain at this time, most likely small vessel disease MRI small acute infarct in left frontal white matter MRA no LVO or hemodynamically significant stenosis Carotid Doppler  unremarkable 2D Echo EF 55 to 60% TCD bubble study no PFO TEE lipomatous interatrial septal hypertrophy without evidence of PFO, negative bubble study, severe LVH with small pericardial effusion and papillary muscle hypertrophy 30-day cardiac monitor as outpt LDL 29 HgbA1c 7.5 UDS positive for THC VTE prophylaxis -Lovenox aspirin 81 mg daily prior to admission, now on aspirin 81 mg daily and clopidogrel 75 mg daily for 3 weeks and then Plavix alone. Therapy recommendations:  Outpatient PT/OT Disposition: [place]    REVIEW OF SYSTEMS: Out of a complete 14 system review of symptoms, the patient complains only of the following symptoms, and all other reviewed systems are negative.  ALLERGIES: Allergies  Allergen Reactions   Cefuroxime Swelling and Other (See Comments)    Ceftin   Benadryl [Diphenhydramine Hcl] Swelling, Anxiety and Other (See Comments)    Hyperactivity and angioedema, also   Tape Rash and Other (See Comments)    Paper tape is okay   Wound Dressing Adhesive Rash and Other (See Comments)    Paper tape is ok    HOME MEDICATIONS: Outpatient Medications Prior to Visit  Medication Sig Dispense Refill   Accu-Chek FastClix Lancets MISC USE AS DIRECTED UP TO FOUR TIMES DAILY 102 each 0   ACCU-CHEK GUIDE test strip USE TO TEST AS DIRECTED UP TO 4 TIMES DAILY 100 strip 2   albuterol (PROVENTIL) (2.5 MG/3ML) 0.083% nebulizer solution Take 3 mLs (2.5 mg total) by nebulization  2 (two) times daily as needed for wheezing or shortness of breath. 150 mL 1   albuterol (VENTOLIN HFA) 108 (90 Base) MCG/ACT inhaler Inhale 2 puffs into the lungs every 6 (six) hours as needed for wheezing or shortness of breath. 1 each 5   atenolol (TENORMIN) 100 MG tablet Take 100 mg by mouth at bedtime.     benazepril (LOTENSIN) 40 MG tablet Take 40 mg by mouth in the morning and at bedtime.     blood glucose meter kit and supplies KIT Dispense based on patient and insurance preference. Use up to four  times daily as directed. 1 each 0   clopidogrel (PLAVIX) 75 MG tablet Take 1 tablet (75 mg total) by mouth daily. 30 tablet 0   escitalopram (LEXAPRO) 10 MG tablet Take 10 mg by mouth at bedtime.     FARXIGA 10 MG TABS tablet Take 10 mg by mouth in the morning.     furosemide (LASIX) 40 MG tablet Take 1 tablet (40 mg total) by mouth daily as needed. (Patient taking differently: Take 40 mg by mouth daily as needed for edema or fluid.) 30 tablet 3   Insulin Pen Needle 32G X 4 MM MISC 50 each by Does not apply route in the morning and at bedtime. (Patient not taking: Reported on 02/01/2023) 50 each 3   ipratropium (ATROVENT) 0.02 % nebulizer solution Take 2.5 mLs (0.5 mg total) by nebulization 2 (two) times daily as needed for wheezing or shortness of breath. 75 mL 12   KERENDIA 20 MG TABS Take 20 mg by mouth in the morning.     mycophenolate (CELLCEPT) 500 MG tablet Take 500 mg by mouth in the morning and at bedtime.     Omega-3 Fatty Acids (FISH OIL) 1000 MG CAPS Take 1,000 mg by mouth in the morning and at bedtime.     OZEMPIC, 1 MG/DOSE, 4 MG/3ML SOPN Inject 1 mg into the skin every Wednesday.     predniSONE (DELTASONE) 5 MG tablet Take 5 mg by mouth daily with breakfast.     rosuvastatin (CRESTOR) 40 MG tablet Take 1 tablet (40 mg total) by mouth at bedtime. 30 tablet 0   sodium bicarbonate 650 MG tablet Take 1,300 mg by mouth 3 (three) times daily.     tacrolimus (PROGRAF) 1 MG capsule Take 1 mg by mouth in the morning and at bedtime.     Tetrahydroz-Dextran-PEG-Povid (VISINE ADVANCED RELIEF OP) Place 1 drop into both eyes 3 (three) times daily as needed (for dryness).     TRESIBA FLEXTOUCH 100 UNIT/ML FlexTouch Pen Inject 13 Units into the skin at bedtime.     zolpidem (AMBIEN) 10 MG tablet Take 10 mg by mouth at bedtime as needed for sleep.     No facility-administered medications prior to visit.    PAST MEDICAL HISTORY: Past Medical History:  Diagnosis Date   Chronic kidney disease     has kidney transplant - having aphoresis now every other week   Diabetes mellitus without complication (HCC)    dm type 2 - fasting 150   History of blood product transfusion    Hyperlipidemia    Hypertension    Menopausal hot flushes 09/06/2017   Controlled on Lexapro by Dr. Rana Snare   Neuromuscular disorder [facility])     PAST SURGICAL HISTORY: Past Surgical History:  Procedure Laterality Date   COLONOSCOPY     diateck catheter placement  09/2013   KIDNEY TRANSPLANT     ORIF ANKLE  FRACTURE Left 2014   TEE WITHOUT CARDIOVERSION N/A 01/13/2023   Procedure: TRANSESOPHAGEAL ECHOCARDIOGRAM;  Surgeon: Christell Constant, MD;  Location: [facility];  Service: Cardiovascular;  Laterality: N/A;    FAMILY HISTORY: Family History  Problem Relation Age of Onset   Hypertension Mother    Other Father 24       amyloidosis   Healthy Brother     SOCIAL HISTORY: Social History   Socioeconomic History   Marital status: Married    Spouse name: Not on file   Number of children: 1   Years of education: Not on file   Highest education level: Not on file  Occupational History   Occupation: Research officer, trade union to hearing impaired    Employer: [employer]  Tobacco Use   Smoking status: Every Day    Current packs/day: 0.00    Types: Cigarettes    Start date: 12/22/1985    Last attempt to quit: 12/23/2015    Years since quitting: 7.1   Smokeless tobacco: Never  Vaping Use   Vaping status: Never Used  Substance and Sexual Activity   Alcohol use: Yes    Alcohol/week: 1.0 standard drink of alcohol    Types: 1 Glasses of wine per week    Comment: weekends occasional   Drug use: No   Sexual activity: Yes  Other Topics Concern   Not on file  Social History Narrative   Not on file   Social Determinants of Health   Financial Resource Strain: Not on file  Food Insecurity: No Food Insecurity (01/11/2023)   Hunger Vital Sign    Worried About Running Out of Food in the Last  Year: Never true    Ran Out of Food in the Last Year: Never true  Transportation Needs: No Transportation Needs (01/11/2023)   PRAPARE - Administrator, Civil Service (Medical): No    Lack of Transportation (Non-Medical): No  Physical Activity: Not on file  Stress: Not on file  Social Connections: Not on file  Intimate Partner Violence: Not At Risk (01/11/2023)   Humiliation, Afraid, Rape, and Kick questionnaire    Fear of Current or Ex-Partner: No    Emotionally Abused: No    Physically Abused: No    Sexually Abused: No      PHYSICAL EXAM  Vitals:   02/14/23 0837  BP: (!) 152/96  Pulse: (!) 57  Weight: 147 lb (66.7 kg)  Height: 5\' 3"  (1.6 m)   Body mass index is 26.04 kg/m.  Generalized: Well developed, in no acute distress   Neurological examination  Mentation: Alert oriented to time, place, history taking. Follows all commands speech and language fluent Cranial nerve II-XII: Pupils were equal round reactive to light. Extraocular movements were full, visual field were full on confrontational test.  Slight facial droop on the left. Uvula tongue midline. Head turning and shoulder shrug  were normal and symmetric. Motor: The motor testing reveals 5 over 5 strength of all 4 extremities. Good symmetric motor tone is noted throughout.  Sensory: Sensory testing is intact to soft touch on all 4 extremities. No evidence of extinction is noted.  Coordination: Cerebellar testing reveals good finger-nose-finger and heel-to-shin bilaterally.  Gait and station: Gait is normal.  Reflexes: Deep tendon reflexes are symmetric and normal bilaterally.   DIAGNOSTIC DATA (LABS, IMAGING, TESTING) - I reviewed patient records, labs, notes, testing and imaging myself where available.  Lab Results  Component Value Date   WBC 8.2  01/12/2023   HGB 16.4 (H) 01/12/2023   HCT 50.3 (H) 01/12/2023   MCV 90.6 01/12/2023   PLT 198 01/12/2023      Component Value Date/Time   NA 140  01/13/2023 0312   NA 137 05/26/2018 0000   K 3.6 01/13/2023 0312   CL 106 01/13/2023 0312   CO2 23 01/13/2023 0312   GLUCOSE 103 (H) 01/13/2023 0312   BUN 29 (H) 01/13/2023 0312   BUN 22 (A) 05/26/2018 0000   CREATININE 2.28 (H) 01/13/2023 0312   CREATININE 1.28 (H) 01/25/2020 1137   CALCIUM 8.7 (L) 01/13/2023 0312   PROT 6.2 (L) 01/11/2023 1000   ALBUMIN 3.4 (L) 01/11/2023 1000   AST 20 01/11/2023 1000   ALT 21 01/11/2023 1000   ALKPHOS 84 01/11/2023 1000   BILITOT 0.8 01/11/2023 1000   GFRNONAA 25 (L) 01/13/2023 0312   GFRNONAA 48 (L) 01/25/2020 1137   GFRAA 56 (L) 01/25/2020 1137   Lab Results  Component Value Date   CHOL 112 01/12/2023   HDL 30 (L) 01/12/2023   LDLCALC 29 01/12/2023   LDLDIRECT 45.0 06/24/2021   TRIG 266 (H) 01/12/2023   CHOLHDL 3.7 01/12/2023   Lab Results  Component Value Date   HGBA1C 7.5 (H) 01/11/2023   No results found for: "VITAMINB12" Lab Results  Component Value Date   TSH 1.16 01/25/2020      ASSESSMENT AND PLAN 57 y.o. year old female  has a past medical history of Chronic kidney disease, Diabetes mellitus without complication (HCC), History of blood product transfusion, Hyperlipidemia, Hypertension, Menopausal hot flushes (09/06/2017), and Neuromuscular disorder (HCC). here with:  Acute Ischemic Infarct:  left frontal white matter acute infarct, etiology: Uncertain at this time, most likely small vessel disease   Continue clopidogrel 75 mg daily  for secondary stroke prevention. Stop aspirin Discussed secondary stroke prevention measures and importance of close PCP follow up for aggressive stroke risk factor management. I have gone over the pathophysiology of stroke, warning signs and symptoms, risk factors and their management in some detail with instructions to go to the closest emergency room for symptoms of concern. HTN: BP goal <130/90.  Elevated today. Advised to check at home if it remains  HLD: LDL goal <70. Recent LDL 29 on  Rosuvastatin 40 mg daily  DMII: A1c goal<7.0. Recent A1c 7.5.  Encouraged patient to monitor diet and encouraged exercise Referred for sleep evaluation  FU with our office PRN      Butch Penny, MSN, NP-C 02/13/2023, 1:57 PM [facility] [address], [address] [place], [place] [address] [phone]

## 2023-02-14 ENCOUNTER — Encounter: Payer: Self-pay | Admitting: Adult Health

## 2023-02-14 ENCOUNTER — Ambulatory Visit: Payer: BC Managed Care – PPO | Admitting: Adult Health

## 2023-02-14 VITALS — BP 152/96 | HR 57 | Ht 63.0 in | Wt 147.0 lb

## 2023-02-14 DIAGNOSIS — I639 Cerebral infarction, unspecified: Secondary | ICD-10-CM | POA: Diagnosis not present

## 2023-02-14 DIAGNOSIS — Z9189 Other specified personal risk factors, not elsewhere classified: Secondary | ICD-10-CM

## 2023-02-14 NOTE — Progress Notes (Signed)
I agree with the above plan

## 2023-02-14 NOTE — Patient Instructions (Signed)
Your Plan:  Continue plavix ok to stop ASA  Blood pressure goal <130/90 Cholesterol LDL goal <70 Diabetes goal A1c <7 Monitor diet and try to exercise  Referral for sleep evaluation   Thank you for coming to see [place] at [facility]. I hope we have been able to provide you high quality care today.  You may receive a patient satisfaction survey over the next few weeks. We would appreciate your feedback and comments so that we may continue to improve ourselves and the health of our patients.

## 2023-02-20 NOTE — Progress Notes (Unsigned)
Cardiology Clinic Note   Patient Name: Isabella Obrien Date of Encounter: 02/20/2023  Primary Care Provider:  Aliene Beams, MD Primary Cardiologist:  Maisie Fus, MD  Patient Profile    57 year old female with history of hypertension, hyperlipidemia, status post kidney transplant for FSGS, and type 2 diabetes.  She has had recent hospitalization where she was admitted on 01/11/2023 after feeling numbness on the left side of her face difficulty swallowing and large speech.  She was diagnosed with acute left frontal infarct.  The patient had a TEE on 01/12/2021 with a negative bubble study, no left atrial thrombus seen, severe LVH related to hypertension.  Was seen by neurology and continued on aspirin and Plavix for 3 weeks and then to be turned to Plavix daily only.  She is being followed by neurology as an outpatient.  Past Medical History    Past Medical History:  Diagnosis Date   Chronic kidney disease    has kidney transplant - having aphoresis now every other week   Diabetes mellitus without complication (HCC)    dm type 2 - fasting 150   History of blood product transfusion    Hyperlipidemia    Hypertension    Menopausal hot flushes 09/06/2017   Controlled on Lexapro by Dr. Rana Snare   Neuromuscular disorder [facility])    Past Surgical History:  Procedure Laterality Date   COLONOSCOPY     diateck catheter placement  09/2013   KIDNEY TRANSPLANT     ORIF ANKLE FRACTURE Left 2014   TEE WITHOUT CARDIOVERSION N/A 01/13/2023   Procedure: TRANSESOPHAGEAL ECHOCARDIOGRAM;  Surgeon: Christell Constant, MD;  Location: [facility];  Service: Cardiovascular;  Laterality: N/A;    Allergies  Allergies  Allergen Reactions   Cefuroxime Swelling and Other (See Comments)    Ceftin   Benadryl [Diphenhydramine Hcl] Swelling, Anxiety and Other (See Comments)    Hyperactivity and angioedema, also   Tape Rash and Other (See Comments)    Paper tape is okay   Wound Dressing Adhesive  Rash and Other (See Comments)    Paper tape is ok    History of Present Illness    [name] is here for posthospitalization follow-up after admission between 01/11/2023 and 01/13/2023 in the setting of acute left frontal infarct, was on aspirin and Plavix and was to return to Plavix alone after 3 weeks post discharge.  Other history includes hypertension.  During hospitalization atorvastatin was changed to rosuvastatin 40 mg daily.  Home Medications    Current Outpatient Medications  Medication Sig Dispense Refill   Accu-Chek FastClix Lancets MISC USE AS DIRECTED UP TO FOUR TIMES DAILY 102 each 0   ACCU-CHEK GUIDE test strip USE TO TEST AS DIRECTED UP TO 4 TIMES DAILY 100 strip 2   albuterol (PROVENTIL) (2.5 MG/3ML) 0.083% nebulizer solution Take 3 mLs (2.5 mg total) by nebulization 2 (two) times daily as needed for wheezing or shortness of breath. 150 mL 1   albuterol (VENTOLIN HFA) 108 (90 Base) MCG/ACT inhaler Inhale 2 puffs into the lungs every 6 (six) hours as needed for wheezing or shortness of breath. 1 each 5   atenolol (TENORMIN) 100 MG tablet Take 100 mg by mouth at bedtime.     benazepril (LOTENSIN) 40 MG tablet Take 40 mg by mouth in the morning and at bedtime.     blood glucose meter kit and supplies KIT Dispense based on patient and insurance preference. Use up to four times daily as directed.  1 each 0   escitalopram (LEXAPRO) 10 MG tablet Take 10 mg by mouth at bedtime.     FARXIGA 10 MG TABS tablet Take 10 mg by mouth in the morning.     furosemide (LASIX) 40 MG tablet Take 1 tablet (40 mg total) by mouth daily as needed. (Patient taking differently: Take 40 mg by mouth daily as needed for edema or fluid.) 30 tablet 3   Insulin Pen Needle 32G X 4 MM MISC 50 each by Does not apply route in the morning and at bedtime. 50 each 3   ipratropium (ATROVENT) 0.02 % nebulizer solution Take 2.5 mLs (0.5 mg total) by nebulization 2 (two) times daily as needed for wheezing or shortness of  breath. 75 mL 12   KERENDIA 20 MG TABS Take 20 mg by mouth in the morning.     mycophenolate (CELLCEPT) 500 MG tablet Take 500 mg by mouth in the morning and at bedtime.     Omega-3 Fatty Acids (FISH OIL) 1000 MG CAPS Take 1,000 mg by mouth in the morning and at bedtime.     OZEMPIC, 1 MG/DOSE, 4 MG/3ML SOPN Inject 1 mg into the skin every Wednesday.     predniSONE (DELTASONE) 5 MG tablet Take 5 mg by mouth daily with breakfast.     rosuvastatin (CRESTOR) 20 MG tablet Take 20 mg by mouth daily.     rosuvastatin (CRESTOR) 40 MG tablet Take 1 tablet (40 mg total) by mouth at bedtime. 30 tablet 0   sodium bicarbonate 650 MG tablet Take 1,300 mg by mouth 3 (three) times daily.     tacrolimus (PROGRAF) 1 MG capsule Take 1 mg by mouth in the morning and at bedtime.     Tetrahydroz-Dextran-PEG-Povid (VISINE ADVANCED RELIEF OP) Place 1 drop into both eyes 3 (three) times daily as needed (for dryness).     TRESIBA FLEXTOUCH 100 UNIT/ML FlexTouch Pen Inject 13 Units into the skin at bedtime.     zolpidem (AMBIEN) 10 MG tablet Take 10 mg by mouth at bedtime as needed for sleep.     No current facility-administered medications for this visit.     Family History    Family History  Problem Relation Age of Onset   Hypertension Mother    Other Father 68       amyloidosis   Healthy Brother    Stroke Paternal Grandmother    She indicated that her mother is alive. She indicated that her father is deceased. She indicated that both of her brothers are alive. She indicated that the status of her paternal grandmother is unknown. She indicated that her daughter is alive.  Social History    Social History   Socioeconomic History   Marital status: Married    Spouse name: Not on file   Number of children: 1   Years of education: Not on file   Highest education level: Not on file  Occupational History   Occupation: Research officer, trade union to hearing impaired    Employer: [employer]  Tobacco Use    Smoking status: Every Day    Current packs/day: 0.50    Average packs/day: 0.5 packs/day for 0.7 years (0.4 ttl pk-yrs)    Types: Cigarettes    Start date: 12/22/1985    Last attempt to quit: 12/23/2015   Smokeless tobacco: Never  Vaping Use   Vaping status: Never Used  Substance and Sexual Activity   Alcohol use: Yes    Alcohol/week: 1.0 standard drink of  alcohol    Types: 1 Glasses of wine per week    Comment: weekends occasional   Drug use: No   Sexual activity: Yes  Other Topics Concern   Not on file  Social History Narrative   Not on file   Social Determinants of Health   Financial Resource Strain: Not on file  Food Insecurity: No Food Insecurity (01/11/2023)   Hunger Vital Sign    Worried About Running Out of Food in the Last Year: Never true    Ran Out of Food in the Last Year: Never true  Transportation Needs: No Transportation Needs (01/11/2023)   PRAPARE - Administrator, Civil Service (Medical): No    Lack of Transportation (Non-Medical): No  Physical Activity: Not on file  Stress: Not on file  Social Connections: Not on file  Intimate Partner Violence: Not At Risk (01/11/2023)   Humiliation, Afraid, Rape, and Kick questionnaire    Fear of Current or Ex-Partner: No    Emotionally Abused: No    Physically Abused: No    Sexually Abused: No     Review of Systems    General:  No chills, fever, night sweats or weight changes.  Cardiovascular:  No chest pain, dyspnea on exertion, edema, orthopnea, palpitations, paroxysmal nocturnal dyspnea. Dermatological: No rash, lesions/masses Respiratory: No cough, dyspnea Urologic: No hematuria, dysuria Abdominal:   No nausea, vomiting, diarrhea, bright red blood per rectum, melena, or hematemesis Neurologic:  No visual changes, wkns, changes in mental status. All other systems reviewed and are otherwise negative except as noted above.       Physical Exam    VS:  LMP 10/24/2012  , BMI There is no height or  weight on file to calculate BMI.     GEN: Well nourished, well developed, in no acute distress. HEENT: normal. Neck: Supple, no JVD, carotid bruits, or masses. Cardiac: RRR, no murmurs, rubs, or gallops. No clubbing, cyanosis, edema.  Radials/DP/PT 2+ and equal bilaterally.  Respiratory:  Respirations regular and unlabored, clear to auscultation bilaterally. GI: Soft, nontender, nondistended, BS + x 4. MS: no deformity or atrophy. Skin: warm and dry, no rash. Neuro:  Strength and sensation are intact. Psych: Normal affect.      Lab Results  Component Value Date   WBC 8.2 01/12/2023   HGB 16.4 (H) 01/12/2023   HCT 50.3 (H) 01/12/2023   MCV 90.6 01/12/2023   PLT 198 01/12/2023   Lab Results  Component Value Date   CREATININE 2.28 (H) 01/13/2023   BUN 29 (H) 01/13/2023   NA 140 01/13/2023   K 3.6 01/13/2023   CL 106 01/13/2023   CO2 23 01/13/2023   Lab Results  Component Value Date   ALT 21 01/11/2023   AST 20 01/11/2023   ALKPHOS 84 01/11/2023   BILITOT 0.8 01/11/2023   Lab Results  Component Value Date   CHOL 112 01/12/2023   HDL 30 (L) 01/12/2023   LDLCALC 29 01/12/2023   LDLDIRECT 45.0 06/24/2021   TRIG 266 (H) 01/12/2023   CHOLHDL 3.7 01/12/2023    Lab Results  Component Value Date   HGBA1C 7.5 (H) 01/11/2023     Review of Prior Studies    TEE 01/13/2023 1. Left ventricular ejection fraction, by estimation, is 65 to 70%. The  left ventricle has normal function. There is severe concentric left  ventricular hypertrophy.   2. Right ventricular systolic function is normal. The right ventricular  size is normal. Mildly increased right  ventricular wall thickness.   3. Appendage assessment imaging did not save to study images. No left  atrial/left atrial appendage thrombus was detected.   4. A small pericardial effusion is present. The pericardial effusion is  circumferential.   5. Billowing without proplapse. The mitral valve is grossly normal. No   evidence of mitral valve regurgitation. No evidence of mitral stenosis.   6. The aortic valve is tricuspid. Aortic valve regurgitation is not  visualized. No aortic stenosis is present.   7. The inferior vena cava is normal in size with greater than 50%  respiratory variability, suggesting right atrial pressure of 3 mmHg.   Carotid Ultrasound 01/18/2023 Summary:  Right Carotid: Velocities in the right ICA are consistent with a 1-39%  stenosis.   Left Carotid: Velocities in the left ICA are consistent with a 1-39%  stenosis.   Vertebrals: Bilateral vertebral arteries demonstrate antegrade flow.  Subclavians: Normal flow hemodynamics were seen in bilateral subclavian               arteries.    Assessment & Plan   1.  ***     {Are you ordering a CV Procedure (e.g. stress test, cath, DCCV, TEE, etc)?   Press F2        :[id number]}   Signed, Bettey Mare. Liborio Nixon, ANP, AACC   02/20/2023 3:38 PM      Office [phone] Fax [phone]  Notice: This dictation was prepared with Dragon dictation along with smaller phrase technology. Any transcriptional errors that result from this process are unintentional and may not be corrected upon review.

## 2023-02-22 ENCOUNTER — Encounter: Payer: Self-pay | Admitting: Adult Health

## 2023-02-22 ENCOUNTER — Ambulatory Visit: Payer: BC Managed Care – PPO | Attending: Adult Health | Admitting: Adult Health

## 2023-02-22 VITALS — BP 130/88 | HR 74 | Ht 63.0 in | Wt 148.4 lb

## 2023-02-22 DIAGNOSIS — I10 Essential (primary) hypertension: Secondary | ICD-10-CM | POA: Diagnosis not present

## 2023-02-22 DIAGNOSIS — I639 Cerebral infarction, unspecified: Secondary | ICD-10-CM

## 2023-02-22 DIAGNOSIS — I119 Hypertensive heart disease without heart failure: Secondary | ICD-10-CM | POA: Diagnosis not present

## 2023-02-22 DIAGNOSIS — N051 Unspecified nephritic syndrome with focal and segmental glomerular lesions: Secondary | ICD-10-CM

## 2023-02-22 DIAGNOSIS — E7800 Pure hypercholesterolemia, unspecified: Secondary | ICD-10-CM | POA: Diagnosis not present

## 2023-02-22 NOTE — Patient Instructions (Signed)
Medication Instructions:  No Changes *If you need a refill on your cardiac medications before your next appointment, please call your pharmacy*   Lab Work: No labs If you have labs (blood work) drawn today and your tests are completely normal, you will receive your results only by: MyChart Message (if you have MyChart) OR A paper copy in the mail If you have any lab test that is abnormal or we need to change your treatment, we will call you to review the results.   Testing/Procedures: No Testing   Follow-Up: At [facility], you and your health needs are our priority.  As part of our continuing mission to provide you with exceptional heart care, we have created designated Provider Care Teams.  These Care Teams include your primary Cardiologist (physician) and Advanced Practice Providers (APPs -  Physician Assistants and Nurse Practitioners) who all work together to provide you with the care you need, when you need it.  We recommend signing up for the patient portal called "MyChart".  Sign up information is provided on this After Visit Summary.  MyChart is used to connect with patients for Virtual Visits (Telemedicine).  Patients are able to view lab/test results, encounter notes, upcoming appointments, etc.  Non-urgent messages can be sent to your provider as well.   To learn more about what you can do with MyChart, go to [email or web address].    Your next appointment:   4 month(s)  Provider:   Maisie Fus, MD

## 2023-02-23 ENCOUNTER — Ambulatory Visit: Payer: BC Managed Care – PPO | Attending: Cardiology

## 2023-02-23 DIAGNOSIS — I639 Cerebral infarction, unspecified: Secondary | ICD-10-CM

## 2023-02-25 ENCOUNTER — Other Ambulatory Visit (INDEPENDENT_AMBULATORY_CARE_PROVIDER_SITE_OTHER): Payer: BC Managed Care – PPO

## 2023-02-25 ENCOUNTER — Encounter: Payer: Self-pay | Admitting: Physician Assistant

## 2023-02-25 ENCOUNTER — Ambulatory Visit: Payer: BC Managed Care – PPO | Admitting: Physician Assistant

## 2023-02-25 DIAGNOSIS — M79642 Pain in left hand: Secondary | ICD-10-CM

## 2023-02-25 DIAGNOSIS — M20012 Mallet finger of left finger(s): Secondary | ICD-10-CM | POA: Insufficient documentation

## 2023-02-25 NOTE — Progress Notes (Signed)
Office Visit Note   Patient: Isabella Obrien           Date of Birth: 18-Nov-1965           MRN: [id number] Visit Date: 02/25/2023              Requested by: Aliene Beams, MD [phone] [address] [place],  [place] [address] PCP: Aliene Beams, MD   Assessment & Plan: Visit Diagnoses:  1. Pain in left hand   2. Mallet finger of left hand     Plan: Mallet finger DIP joint left ring finger.  We will place her in a Splint.  She understands is imperative that she keep this in extension.  She is not to let the finger bend.  She moves the splint for showering she is to manually hold it in full extension.  She will follow-up in 3 weeks for recheck.  Again at that time we will keep her finger in full extension.  She understands that this may take 8 to 10 weeks to heal.  If she has failure and finds this uncomfortable could discuss surgical options but hopefully this will not be necessary  Follow-Up Instructions: Return in about 3 weeks (around 03/18/2023).   Orders:  Orders Placed This Encounter  Procedures   XR Hand Complete Left   No orders of the defined types were placed in this encounter.     Procedures: No procedures performed   Clinical Data: No additional findings.   Subjective: Chief Complaint  Patient presents with   Left Hand - Injury    Patient is a pleasant 58 year old right-hand-dominant woman who presents with a 6-day history of deformity of the distal phalanx of her left ring finger.  She said her finger got caught when she was closing a cabinet between her nail and the cabinet handle.  Denies any previous history.  She did have some swelling.  She is concerned because she cannot actively extend the end of her finger.  Review of Systems  All other systems reviewed and are negative.    Objective: Vital Signs: LMP 10/24/2012   Physical Exam Constitutional:      Appearance: Normal appearance.  Pulmonary:     Effort: Pulmonary effort is normal.   Skin:    General: Skin is warm and dry.  Neurological:     General: No focal deficit present.     Mental Status: She is alert and oriented to person, place, and time.  Psychiatric:        Mood and Affect: Mood normal.     Ortho Exam Examination she has brisk capillary refill less than 2 seconds mild soft tissue swelling no ecchymosis no cellulitis pulses are intact.  She cannot actively extend her ring finger at the DIP joint.  I can passively extend it to full extension. Specialty Comments:  No specialty comments available.  Imaging: No results found.   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 Rana Snare   Neuromuscular disorder [facility])     Family History  Problem Relation Age of Onset   Hypertension Mother    Other Father 2       amyloidosis   Healthy Brother    Stroke Paternal Grandmother      Past Surgical History:  Procedure Laterality Date   COLONOSCOPY     diateck catheter placement  09/2013   KIDNEY TRANSPLANT     ORIF ANKLE FRACTURE Left 2014   TEE WITHOUT CARDIOVERSION N/A 01/13/2023   Procedure: TRANSESOPHAGEAL ECHOCARDIOGRAM;  Surgeon: Christell Constant, MD;  Location: [facility] INVASIVE CV LAB;  Service: Cardiovascular;  Laterality: N/A;   Social History   Occupational History   Occupation: Research officer, trade union to hearing impaired    Employer: [employer]  Tobacco Use   Smoking status: Every Day    Current packs/day: 0.50    Average packs/day: 0.5 packs/day for 0.8 years (0.4 ttl pk-yrs)    Types: Cigarettes    Start date: 12/22/1985    Last attempt to quit: 12/23/2015   Smokeless tobacco: Never  Vaping Use   Vaping status: Never Used  Substance and Sexual Activity   Alcohol use: Yes    Alcohol/week: 1.0 standard drink of alcohol    Types: 1 Glasses of wine per week    Comment: weekends occasional   Drug use: No   Sexual activity: Yes

## 2023-03-16 ENCOUNTER — Encounter: Payer: Self-pay | Admitting: Neurology

## 2023-03-16 ENCOUNTER — Ambulatory Visit: Payer: BC Managed Care – PPO | Admitting: Neurology

## 2023-03-16 VITALS — BP 130/88 | HR 65 | Ht 63.0 in | Wt 145.8 lb

## 2023-03-16 DIAGNOSIS — Z7960 Long term (current) use of unspecified immunomodulators and immunosuppressants: Secondary | ICD-10-CM | POA: Diagnosis not present

## 2023-03-16 DIAGNOSIS — N051 Unspecified nephritic syndrome with focal and segmental glomerular lesions: Secondary | ICD-10-CM

## 2023-03-16 DIAGNOSIS — Z940 Kidney transplant status: Secondary | ICD-10-CM

## 2023-03-16 DIAGNOSIS — I151 Hypertension secondary to other renal disorders: Secondary | ICD-10-CM

## 2023-03-16 DIAGNOSIS — E1169 Type 2 diabetes mellitus with other specified complication: Secondary | ICD-10-CM

## 2023-03-16 DIAGNOSIS — I639 Cerebral infarction, unspecified: Secondary | ICD-10-CM | POA: Diagnosis not present

## 2023-03-16 DIAGNOSIS — E139 Other specified diabetes mellitus without complications: Secondary | ICD-10-CM

## 2023-03-16 DIAGNOSIS — E782 Mixed hyperlipidemia: Secondary | ICD-10-CM

## 2023-03-16 NOTE — Progress Notes (Signed)
SLEEP MEDICINE CLINIC    Provider:  Melvyn Novas, MD  Primary Neurologist:  June Leap     Referring Provider: Delia Heady, MD via NP Butch Penny, [place], [place] [address] [place],  [place] [address]          Chief Complaint according to patient   Patient presents with:     New Sleep Patient (Initial Visit) stroke referral            HISTORY OF PRESENT ILLNESS:  Isabella Obrien is a 57 y.o. female patient who is seen upon stroke team referral on 03/16/2023  for a Consult .  Chief concern according to patient :  " I can't sleep on my back because it will cause me to choke, and I have menopausal hot flushes for about 12 years , not on HRT. 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    The patient is here to have a sleep study - after her Zio patch didn't show arrhythmia. Her echo was normal , EF 55-60% and TEE showed septal hypertrophy. Labs were reviewed in her presence.   Sleep relevant medical history:  Nocturia, on diuretics, RLS while on Benadryl, seasonal colds  Family medical /sleep history: father  ( deceased April 02, 2015 of Amyloidosis) and brother on CPAP with OSA, no insomnia, no sleep walkers.    Social history:  Patient is working as a Geologist, 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[employer]  and lives in a household with spouse and daughter  , 2 dogs  The patient currently works/ Tobacco use; from 7th grade - and still smokes.   ETOH use ;rare   Caffeine intake in form of Coffee( none ) Soda( in AM ) Tea ( /) or energy drinks Exercise in form of  walking, but no routine.      Sleep habits are as follows: The patient's dinner time is between 6-6.30 PM. The patient goes to bed at 10 PM and listens to a  pod cast,  continues to sleep for 6 hours, wakes for 2-3 bathroom breaks, the first time at 2, 4 , 5.30 AM.   The preferred sleep position is right, with the support of 6 pillows. Dreams are reportedly frequent/vivid. The patient wakes up with an alarm, 5.15 AM .  5.40  AM is the usual rise time. Work begins at 6.30 and ends at 2.30 PM.  Lunch at work.  She reports not feeling refreshed or restored in AM, with symptoms such as dry mouth, rarely morning headaches. Naps are taken 2 times week and weekends -frequently, lasting from 45 to 90 minutes and are more refreshing than nocturnal sleep.    Review of Systems: Out of a complete 14 system review, the patient complains of only the following symptoms, and all other reviewed systems are negative.:  Fatigue, sleepiness , snoring, fragmented sleep, Insomnia, RLS =only when she takes benadryl , Nocturia    How likely are you to doze in the following situations: 0 = not likely, 1 = slight chance, 2 = moderate chance, 3 = high chance   Sitting and Reading? Watching Television? Sitting inactive in a public place (theater or meeting)? As a passenger in  a car for an hour without a break? Lying down in the afternoon when circumstances permit? Sitting and talking to someone? Sitting quietly after lunch without alcohol? In a car, while stopped for a few minutes in traffic?   Total = 6/ 24 points   FSS endorsed at 45/ 63 points.  Depression/ anxiety is present.   Social History   Socioeconomic History   Marital status: Married    Spouse name: Not on file   Number of children: 1   Years of education: Not on file   Highest education level: Bachelor - 4 years   Occupational History   Occupation: Research officer, trade union to hearing impaired    Employer: [employer]  Tobacco Use   Smoking status: Every Day    Current packs/day: 0.50    Average packs/day: 0.5 packs/day for 0.8 years (0.4 ttl pk-yrs)    Types: Cigarettes    Start date:  12/22/1985    Last attempt to quit: 12/23/2015   Smokeless tobacco: Never  Vaping Use   Vaping status: Never Used  Substance and Sexual Activity   Alcohol use: Yes    Alcohol/week: 1.0 standard drink of alcohol    Types: 1 Glasses of wine per week    Comment: weekends occasional   Drug use: No   Sexual activity: Yes  Other Topics Concern   Not on file  Social History Narrative   Not on file   Social Determinants of Health   Financial Resource Strain: Not on file  Food Insecurity: No Food Insecurity (01/11/2023)   Hunger Vital Sign    Worried About Running Out of Food in the Last Year: Never true    Ran Out of Food in the Last Year: Never true  Transportation Needs: No Transportation Needs (01/11/2023)   PRAPARE - Administrator, Civil Service (Medical): No    Lack of Transportation (Non-Medical): No  Physical Activity: Not on file  Stress: Not on file  Social Connections: Not on file    Family History  Problem Relation Age of Onset   Hypertension Mother    Other Father 71       amyloidosis   Healthy Brother    Stroke Paternal Grandmother     Past Medical History:  Diagnosis Date   Chronic kidney disease    has kidney transplant - having aphoresis now every other week   Diabetes mellitus without complication (HCC)    dm type 2 - fasting 150   History of blood product transfusion    Hyperlipidemia    Hypertension    Menopausal hot flushes 09/06/2017   Controlled on Lexapro by Dr. Rana Snare   Neuromuscular disorder [facility])     Past Surgical History:  Procedure Laterality Date   COLONOSCOPY     diateck catheter placement  09/2013   KIDNEY TRANSPLANT     ORIF ANKLE FRACTURE Left 2014   TEE WITHOUT CARDIOVERSION N/A 01/13/2023   Procedure: TRANSESOPHAGEAL ECHOCARDIOGRAM;  Surgeon: Christell Constant, MD;  Location: [facility];  Service: Cardiovascular;  Laterality: N/A;     Current Outpatient Medications on File Prior to Visit  Medication Sig  Dispense Refill   Accu-Chek FastClix Lancets MISC USE AS DIRECTED UP TO FOUR TIMES DAILY 102 each 0   ACCU-CHEK GUIDE test strip USE TO TEST AS DIRECTED UP TO 4 TIMES DAILY 100 strip 2   albuterol (PROVENTIL) (2.5 MG/3ML) 0.083% nebulizer solution Take 3 mLs (2.5 mg total) by nebulization 2 (  two) times daily as needed for wheezing or shortness of breath. 150 mL 1   albuterol (VENTOLIN HFA) 108 (90 Base) MCG/ACT inhaler Inhale 2 puffs into the lungs every 6 (six) hours as needed for wheezing or shortness of breath. 1 each 5   atenolol (TENORMIN) 100 MG tablet Take 100 mg by mouth at bedtime.     benazepril (LOTENSIN) 40 MG tablet Take 40 mg by mouth in the morning and at bedtime.     blood glucose meter kit and supplies KIT Dispense based on patient and insurance preference. Use up to four times daily as directed. 1 each 0   clopidogrel (PLAVIX) 75 MG tablet Take 75 mg by mouth daily.     escitalopram (LEXAPRO) 10 MG tablet Take 10 mg by mouth at bedtime.     FARXIGA 10 MG TABS tablet Take 10 mg by mouth in the morning.     furosemide (LASIX) 40 MG tablet Take 1 tablet (40 mg total) by mouth daily as needed. (Patient taking differently: Take 40 mg by mouth daily as needed for edema or fluid.) 30 tablet 3   insulin glargine (LANTUS SOLOSTAR) 100 UNIT/ML Solostar Pen Inject 100 Units into the skin daily.     Insulin Pen Needle 32G X 4 MM MISC 50 each by Does not apply route in the morning and at bedtime. 50 each 3   ipratropium (ATROVENT) 0.02 % nebulizer solution Take 2.5 mLs (0.5 mg total) by nebulization 2 (two) times daily as needed for wheezing or shortness of breath. 75 mL 12   KERENDIA 20 MG TABS Take 20 mg by mouth in the morning.     mycophenolate (CELLCEPT) 500 MG tablet Take 500 mg by mouth in the morning and at bedtime.     nicotine (NICODERM CQ - DOSED IN MG/24 HOURS) 21 mg/24hr patch Place 21 mg onto the skin daily.     Omega-3 Fatty Acids (FISH OIL) 1000 MG CAPS Take 1,000 mg by mouth  in the morning and at bedtime.     OZEMPIC, 1 MG/DOSE, 4 MG/3ML SOPN Inject 1 mg into the skin every Wednesday.     predniSONE (DELTASONE) 5 MG tablet Take 5 mg by mouth daily with breakfast.     rosuvastatin (CRESTOR) 20 MG tablet Take 20 mg by mouth daily.     sodium bicarbonate 650 MG tablet Take 1,300 mg by mouth 3 (three) times daily.     tacrolimus (PROGRAF) 1 MG capsule Take 1 mg by mouth in the morning and at bedtime.     Tetrahydroz-Dextran-PEG-Povid (VISINE ADVANCED RELIEF OP) Place 1 drop into both eyes 3 (three) times daily as needed (for dryness).     TRESIBA FLEXTOUCH 100 UNIT/ML FlexTouch Pen Inject 13 Units into the skin at bedtime.     zolpidem (AMBIEN) 10 MG tablet Take 10 mg by mouth at bedtime as needed for sleep.     rosuvastatin (CRESTOR) 40 MG tablet Take 1 tablet (40 mg total) by mouth at bedtime. 30 tablet 0   No current facility-administered medications on file prior to visit.    Allergies  Allergen Reactions   Cefuroxime Swelling and Other (See Comments)    Ceftin   Benadryl [Diphenhydramine Hcl] Swelling, Anxiety and Other (See Comments)    Hyperactivity and angioedema, also   Tape Rash and Other (See Comments)    Paper tape is okay   Wound Dressing Adhesive Rash and Other (See Comments)    Paper tape is ok  DIAGNOSTIC DATA (LABS, IMAGING, TESTING) - I reviewed patient records, labs, notes, testing and imaging myself where available.  Lab Results  Component Value Date   WBC 8.2 01/12/2023   HGB 16.4 (H) 01/12/2023   HCT 50.3 (H) 01/12/2023   MCV 90.6 01/12/2023   PLT 198 01/12/2023      Component Value Date/Time   NA 140 01/13/2023 0312   NA 137 05/26/2018 0000   K 3.6 01/13/2023 0312   CL 106 01/13/2023 0312   CO2 23 01/13/2023 0312   GLUCOSE 103 (H) 01/13/2023 0312   BUN 29 (H) 01/13/2023 0312   BUN 22 (A) 05/26/2018 0000   CREATININE 2.28 (H) 01/13/2023 0312   CREATININE 1.28 (H) 01/25/2020 1137   CALCIUM 8.7 (L) 01/13/2023 0312    PROT 6.2 (L) 01/11/2023 1000   ALBUMIN 3.4 (L) 01/11/2023 1000   AST 20 01/11/2023 1000   ALT 21 01/11/2023 1000   ALKPHOS 84 01/11/2023 1000   BILITOT 0.8 01/11/2023 1000   GFRNONAA 25 (L) 01/13/2023 0312   GFRNONAA 48 (L) 01/25/2020 1137   GFRAA 56 (L) 01/25/2020 1137   Lab Results  Component Value Date   CHOL 112 01/12/2023   HDL 30 (L) 01/12/2023   LDLCALC 29 01/12/2023   LDLDIRECT 45.0 06/24/2021   TRIG 266 (H) 01/12/2023   CHOLHDL 3.7 01/12/2023   Lab Results  Component Value Date   HGBA1C 7.5 (H) 01/11/2023   No results found for: "VITAMINB12" Lab Results  Component Value Date   TSH 1.16 01/25/2020    PHYSICAL EXAM:  Today's Vitals   03/16/23 1435  BP: 130/88  Pulse: 65  Weight: 145 lb 12.8 oz (66.1 kg)  Height: 5\' 3"  (1.6 m)   Body mass index is 25.83 kg/m.   Wt Readings from Last 3 Encounters:  03/16/23 145 lb 12.8 oz (66.1 kg)  02/22/23 148 lb 6.4 oz (67.3 kg)  02/14/23 147 lb (66.7 kg)     Ht Readings from Last 3 Encounters:  03/16/23 5\' 3"  (1.6 m)  02/22/23 5\' 3"  (1.6 m)  02/14/23 5\' 3"  (1.6 m)      General: The patient is awake, alert and appears not in acute distress. She appears older than her numeric age,  The patient is well groomed. Head: Normocephalic, atraumatic. Neck is supple. Mallampati 3,  neck circumference:14 inches .  Nasal airflow not fully patent.   Retrognathia is not seen.  Dental status: biological  Cardiovascular:  Regular rate and cardiac rhythm by pulse,  without distended neck veins. Respiratory: Lungs are clear to auscultation.  Skin:  Without evidence of ankle edema, or rash. Trunk: The patient's posture is erect.   NEUROLOGIC EXAM: The patient is awake and alert, oriented to place and time.   Memory subjective described as intact.  Attention span & concentration ability appears normal.  Speech is fluent, without dysarthria, dysphonia or aphasia.  Mood and affect are appropriate.   Cranial nerves: no loss of  smell or taste reported  Pupils are unequal , left larger - and not briskly reactive to light. Funduscopic exam deferred.  Extraocular movements in vertical and horizontal planes were intact and without nystagmus. No Diplopia. Visual fields by finger perimetry are intact. Hearing was intact to soft voice and finger rubbing.   Facial sensation intact to fine touch. Facial motor strength is asymmetric , droopy on the right angle of the mouth and tongue and uvula move midline.  Neck ROM : rotation, tilt and flexion extension  were normal for age and shoulder shrug was symmetrical.    Motor exam:  Symmetric bulk, tone and ROM.   Normal tone without cog wheeling, reduced but symmetric grip strength .   Sensory:  Fine touch, pinprick and vibration were tested  and  normal.  Proprioception tested in the upper extremities was normal. Coordination: Rapid alternating movements in the fingers/hands were of normal speed.  The Finger-to-nose maneuver was intact without evidence of ataxia, dysmetria or tremor.   Gait and station: Patient could rise unassisted from a seated position, walked without assistive device.  Deep tendon reflexes: in the  upper and lower extremities are symmetric and intact.  Babinski response was deferred.   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     1) the stroke occurred on 10 January 2023 so fairly recently.  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  She has had good control of blood pressure.  The patient has a risk factor of smoking, which will address the vascular constriction.  Her echocardiogram showed a  thickening of the septal wall of the heart but her ejection fraction was estimated to be between 55 and 60%.  She did not have cardiac arrhythmias on telemetry.  The main cause for her apnea induced strokes is via atrial fibrillation and the pathway would be an embolism.  2) OSA risk is  above average- smoking, DM, HTN and small airway , history of snoring. She sleeps ain a separate room from her husband.   3) good sleep hygiene.    I will order a HST for this patient, as our concern is a screening for OSA. / hypoxia.  With follow up  through  NP within 3-5 months.   I would like to thank June Leap,  and Butch Penny, [address] [place],  [place] [address] for allowing me to meet with and to take care of this pleasant patient.    After spending a total time of  45  minutes face to face and additional time for physical and neurologic examination, review of laboratory studies,  personal review of imaging studies, reports and results of other testing and review of referral information / records as far as provided in visit,   Electronically signed by: Melvyn Novas, MD 03/16/2023 2:49 PM  [facility] and [facility] Board certified by The [employer] of Sleep Medicine and Diplomate of the [employer] of Sleep Medicine. Board certified In Neurology through the ABPN, Fellow of the [employer] of Neurology.

## 2023-03-25 ENCOUNTER — Encounter: Payer: Self-pay | Admitting: Physician Assistant

## 2023-03-25 ENCOUNTER — Ambulatory Visit (INDEPENDENT_AMBULATORY_CARE_PROVIDER_SITE_OTHER): Payer: BC Managed Care – PPO | Admitting: Physician Assistant

## 2023-03-25 DIAGNOSIS — M79642 Pain in left hand: Secondary | ICD-10-CM | POA: Diagnosis not present

## 2023-03-25 NOTE — Progress Notes (Signed)
Office Visit Note   Patient: Isabella Obrien           Date of Birth: [birth year]           MRN: [id number] Visit Date: 03/25/2023              Requested by: Aliene Beams, MD [phone] [address] [place],  [place] [address] PCP: Aliene Beams, MD  Chief Complaint  Patient presents with   Left Hand - Follow-up      HPI: Patient is a pleasant 57 year old woman who is in follow-up today for her mallet finger.  Is now been 4 weeks since she was placed in her splint.  She said she is kept this on as directed.  It did slip off 1 night but she did not think she had any reoccurrence of the deformity.  Denies any finger pain  Assessment & Plan: Visit Diagnoses: Mallet finger  Plan: Follow-up in 6 weeks at that time she will be 10 weeks hopefully we can discontinue the splint  Follow-Up Instructions: No follow-ups on file.   Ortho Exam  Patient is alert, oriented, no adenopathy, well-dressed, normal affect, normal respiratory effort. Examination splint is in place she has brisk capillary refill in her fingers sensation is intact no swelling  Imaging: No results found. No images are attached to the encounter.  Labs: Lab Results  Component Value Date   HGBA1C 7.5 (H) 01/11/2023   HGBA1C 7.1 (A) 06/24/2021   HGBA1C 7.2 (A) 11/03/2020     Lab Results  Component Value Date   ALBUMIN 3.4 (L) 01/11/2023   ALBUMIN 4.1 06/24/2021   ALBUMIN 4.1 06/24/2021    No results found for: "MG" No results found for: "VD25OH"  No results found for: "PREALBUMIN"    Latest Ref Rng & Units 01/12/2023    7:34 AM 01/11/2023   10:00 AM 06/24/2021    3:56 PM  CBC EXTENDED  WBC 4.0 - 10.5 K/uL 8.2  10.7  9.1   RBC 3.87 - 5.11 MIL/uL 5.55  5.30  5.27   Hemoglobin 12.0 - 15.0 g/dL 78.2  95.6  21.3   HCT 36.0 - 46.0 % 50.3  49.5  47.7   Platelets 150 - 400 K/uL 198  193  238.0   NEUT# 1.7 - 7.7 K/uL  8.0  7.1   Lymph# 0.7 - 4.0 K/uL  1.3  1.2      There is no height or weight on  file to calculate BMI.  Orders:  No orders of the defined types were placed in this encounter.  No orders of the defined types were placed in this encounter.    Procedures: No procedures performed  Clinical Data: No additional findings.  ROS:  All other systems negative, except as noted in the HPI. Review of Systems  Objective: Vital Signs: LMP 10/24/2012   Specialty Comments:  No specialty comments available.  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 Rana Snare   Neuromuscular disorder [facility])     Family History  Problem Relation Age of Onset   Hypertension Mother    Other Father 21       amyloidosis   Healthy Brother    Stroke Paternal Grandmother     Past Surgical History:  Procedure Laterality Date   COLONOSCOPY     diateck catheter placement  09/2013   KIDNEY TRANSPLANT     ORIF ANKLE  FRACTURE Left 2014   TEE WITHOUT CARDIOVERSION N/A 01/13/2023   Procedure: TRANSESOPHAGEAL ECHOCARDIOGRAM;  Surgeon: Christell Constant, MD;  Location: [facility] INVASIVE CV LAB;  Service: Cardiovascular;  Laterality: N/A;   Social History   Occupational History   Occupation: Research officer, trade union to hearing impaired    Employer: [employer]  Tobacco Use   Smoking status: Every Day    Current packs/day: 0.50    Average packs/day: 0.5 packs/day for 0.8 years (0.4 ttl pk-yrs)    Types: Cigarettes    Start date: 12/22/1985    Last attempt to quit: 12/23/2015   Smokeless tobacco: Never  Vaping Use   Vaping status: Never Used  Substance and Sexual Activity   Alcohol use: Yes    Alcohol/week: 1.0 standard drink of alcohol    Types: 1 Glasses of wine per week    Comment: weekends occasional   Drug use: No   Sexual activity: Yes

## 2023-05-11 ENCOUNTER — Ambulatory Visit: Payer: BC Managed Care – PPO | Admitting: Neurology

## 2023-05-11 DIAGNOSIS — Z7960 Long term (current) use of unspecified immunomodulators and immunosuppressants: Secondary | ICD-10-CM

## 2023-05-11 DIAGNOSIS — N051 Unspecified nephritic syndrome with focal and segmental glomerular lesions: Secondary | ICD-10-CM

## 2023-05-11 DIAGNOSIS — E139 Other specified diabetes mellitus without complications: Secondary | ICD-10-CM

## 2023-05-11 DIAGNOSIS — Z940 Kidney transplant status: Secondary | ICD-10-CM

## 2023-05-11 DIAGNOSIS — I151 Hypertension secondary to other renal disorders: Secondary | ICD-10-CM

## 2023-05-11 DIAGNOSIS — I639 Cerebral infarction, unspecified: Secondary | ICD-10-CM

## 2023-05-11 DIAGNOSIS — E1169 Type 2 diabetes mellitus with other specified complication: Secondary | ICD-10-CM

## 2023-05-12 ENCOUNTER — Telehealth: Payer: Self-pay | Admitting: Neurology

## 2023-05-12 NOTE — Telephone Encounter (Signed)
Pt reports that her Home sleep study last night did not go well, she would like to discuss with RN

## 2023-05-16 ENCOUNTER — Ambulatory Visit: Payer: BC Managed Care – PPO | Admitting: Neurology

## 2023-05-16 DIAGNOSIS — E139 Other specified diabetes mellitus without complications: Secondary | ICD-10-CM

## 2023-05-16 DIAGNOSIS — Z7960 Long term (current) use of unspecified immunomodulators and immunosuppressants: Secondary | ICD-10-CM

## 2023-05-16 DIAGNOSIS — G4733 Obstructive sleep apnea (adult) (pediatric): Secondary | ICD-10-CM

## 2023-05-16 DIAGNOSIS — E1169 Type 2 diabetes mellitus with other specified complication: Secondary | ICD-10-CM

## 2023-05-16 DIAGNOSIS — N051 Unspecified nephritic syndrome with focal and segmental glomerular lesions: Secondary | ICD-10-CM

## 2023-05-16 DIAGNOSIS — I639 Cerebral infarction, unspecified: Secondary | ICD-10-CM

## 2023-05-16 DIAGNOSIS — Z940 Kidney transplant status: Secondary | ICD-10-CM

## 2023-05-16 DIAGNOSIS — I151 Hypertension secondary to other renal disorders: Secondary | ICD-10-CM

## 2023-05-16 NOTE — Telephone Encounter (Signed)
I left a voicemail on the patient phone informing her that we need the device to be return today. I left my direct number to call back or she can sent me a mychart message.

## 2023-05-16 NOTE — Telephone Encounter (Signed)
Patient returned my call she informed me that she took the device off in the middle of the night and would like to do it again.  I informed her to bring back the reusable device and I would give her a disposable sleep study device. I informed her that she needed to do the study before 05/22/23 or she will be charged the full price due to her insurance change after the new year.

## 2023-05-16 NOTE — Telephone Encounter (Signed)
She is scheduled to pick it up at 1:30 pm today.

## 2023-05-24 NOTE — Procedures (Signed)
[facility] 57 year old female [birth year]   HOME SLEEP TEST REPORT ( by Watch PAT)  MAIL OUT DEVICE  STUDY DATE:  DATA were loaded at 9 AM on 05-24-2023    ORDERING CLINICIAN: Butch Penny, NP - Melvyn Novas, MD  REFERRING CLINICIAN: STROKE patient .Delia Heady, MD via NP Butch Penny, Np [address] [place],  [place] [address]      CLINICAL INFORMATION/HISTORY: 03-16-2023. Isabella Obrien is a 57 y.o. female patient who is seen upon Dr Marlis Edelson Stroke team referral on 03/16/2023  for a Sleep Medicine Consult .  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 Her echo was normal , EF 55-60% and TEE showed septal hypertrophy. Labs were reviewed in her presence.  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     Epworth sleepiness score:  6/ 24 points   FSS endorsed at 45/ 63 points.  Depression/ anxiety is present.    BMI:  25.8 kg/m   Neck Circumference: 14"   FINDINGS:   Sleep Summary:   Total Recording Time (hours, min):    8 hours 22 minutes   Total Sleep Time (hours, min):    6 hours 31 minutes             Percent REM (%):    30.7%     Sleep architecture shows very broken sleep the patient barely slept an hour uninterrupted.                                 Respiratory Indices following AASM criteria:   Calculated pAHI (per hour):     30.7/h                        REM pAHI:    44 /h                                              NREM pAHI:    24/h                          Positional AHI:   The patient slept for the majority of the night in supine for 250 minutes (approximately ) with an AHI of 37.2 .  There is 115 minutes of right lateral sleep with an AHI of only 18.2/h and 28 minutes of prone sleep here the AHI was 28/h.  Snoring level reached a mean volume of 40 dB which is not considered loud and is close to the threshold of detection for this device.  Snoring was only  present for about 5% of total sleep time.                                               Oxygen Saturation Statistics:   Oxygen Saturation (%) Mean:    92%                 O2 Saturation Range (%):    Between the nadir at 85% of the maximal saturation of 97%.                                   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  No central apneas were registered and there was no significant oxygen desaturation noted.  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   RECOMMENDATION: Autotitration CPAP device is the treatment for this form of sleep apnea, I would like to add that there was a high and frequent variability of pulse rate, which the patient may feel as palpitations.  An auto titration by [employer] will be ordered for this patient a CPAP device set between 5 and 15 cm water pressure was 2 cm EPR and heated humidification.  As the patient was not snoring during the home sleep test she may actually be able to use a nasal interface instead of a fullface mask.  I will trust the DME to fit the patient in a  reclined position.    INTERPRETING PHYSICIAN:   Melvyn Novas, MD [facility] Sleep Board certified by The [employer] of Sleep Medicine and Diplomate of the [employer] of Sleep Medicine. Board certified In Neurology through the ABPN, Fellow of the [employer] of Neurology.    cc ; Carrington Clamp ,NP and Delia Heady, MD

## 2023-05-30 NOTE — Telephone Encounter (Addendum)
 Isabella Obrien, CMA  Isabella Obrien, Tammy; Zott, Stacy New orders have been placed for the above pt, DOB: 2065-11-11 Thanks  Zott, Stacy  Aneesa Romey, Abbe Amsterdam, CMA; Melvern Sample Got It Thank You

## 2023-06-09 NOTE — Telephone Encounter (Signed)
Pt states she forgot DME's name and number. Information given

## 2023-06-27 ENCOUNTER — Ambulatory Visit: Payer: 59 | Attending: Internal Medicine | Admitting: Internal Medicine

## 2023-06-27 ENCOUNTER — Encounter: Payer: Self-pay | Admitting: Internal Medicine

## 2023-06-27 VITALS — BP 116/78 | HR 65 | Ht 63.0 in | Wt 139.0 lb

## 2023-06-27 DIAGNOSIS — Z940 Kidney transplant status: Secondary | ICD-10-CM

## 2023-06-27 DIAGNOSIS — I151 Hypertension secondary to other renal disorders: Secondary | ICD-10-CM

## 2023-06-27 DIAGNOSIS — I639 Cerebral infarction, unspecified: Secondary | ICD-10-CM | POA: Diagnosis not present

## 2023-06-27 NOTE — Patient Instructions (Signed)
Medication Instructions:  No changes  *If you need a refill on your cardiac medications before your next appointment, please call your pharmacy*   Lab Work: None    Testing/Procedures: None    Follow-Up: At [facility], you and your health needs are our priority.  As part of our continuing mission to provide you with exceptional heart care, we have created designated Provider Care Teams.  These Care Teams include your primary Cardiologist (physician) and Advanced Practice Providers (APPs -  Physician Assistants and Nurse Practitioners) who all work together to provide you with the care you need, when you need it.  Your next appointment:   1 year(s)  Provider:   With any App  Other Instructions   1st Floor: - Lobby - Registration  - Pharmacy  - Lab - Cafe  2nd Floor: - PV Lab - Diagnostic Testing (echo, CT, nuclear med)  3rd Floor: - Vacant  4th Floor: - TCTS (cardiothoracic surgery) - AFib Clinic - Structural Heart Clinic - Vascular Surgery  - Vascular Ultrasound  5th Floor: - [facility] Cardiology (general and EP) - Clinical Pharmacy for coumadin, hypertension, lipid, weight-loss medications, and med management appointments    Valet parking services will be available as well.

## 2023-06-27 NOTE — Progress Notes (Addendum)
Cardiology Clinic Note   Patient Name: Isabella Obrien Date of Encounter: 06/27/2023  Primary Care Provider:  Aliene Beams, MD Primary Cardiologist:  Maisie Fus, MD  Patient Profile    58 year old female with history of hypertension, hyperlipidemia, status post kidney transplant for FSGS, and type 2 diabetes.  She has had recent hospitalization where she was admitted on 01/11/2023 after feeling numbness on the left side of her face difficulty swallowing and large speech.  She was diagnosed with acute left frontal infarct.    The patient had a TEE on 01/12/2021 with a negative bubble study, no left atrial thrombus seen, severe LVH related to hypertension.  Was seen by neurology and continued on aspirin and Plavix for 3 weeks and then to be turned to Plavix daily only.  She is being followed by neurology as an outpatient.   Interim hx 06/27/2023 She is asymptomatic. She denies CP or SOB. No changes in family hx.  Blood pressure is well controlled.  She would like to undergo hemorrhoid surgery and her Plavix will need to be stopped.  Furthermore she is having some cough and wheezing today.  Sats are 94%.  She is still smoking.  She has breathing treatments and albuterol at home.   Past Medical History    Past Medical History:  Diagnosis Date   Chronic kidney disease    has kidney transplant - having aphoresis now every other week   Diabetes mellitus without complication (HCC)    dm type 2 - fasting 150   History of blood product transfusion    Hyperlipidemia    Hypertension    Menopausal hot flushes 09/06/2017   Controlled on Lexapro by Dr. Rana Snare   Neuromuscular disorder [facility])    Past Surgical History:  Procedure Laterality Date   COLONOSCOPY     diateck catheter placement  09/2013   KIDNEY TRANSPLANT     ORIF ANKLE FRACTURE Left 2014   TEE WITHOUT CARDIOVERSION N/A 01/13/2023   Procedure: TRANSESOPHAGEAL ECHOCARDIOGRAM;  Surgeon: Christell Constant, MD;  Location: [facility]  INVASIVE CV LAB;  Service: Cardiovascular;  Laterality: N/A;    Allergies  Allergies  Allergen Reactions   Cefuroxime Swelling and Other (See Comments)    Ceftin   Benadryl [Diphenhydramine Hcl] Swelling, Anxiety and Other (See Comments)    Hyperactivity and angioedema, also   Tape Rash and Other (See Comments)    Paper tape is okay   Wound Dressing Adhesive Rash and Other (See Comments)    Paper tape is ok      Home Medications    Current Outpatient Medications  Medication Sig Dispense Refill   Accu-Chek FastClix Lancets MISC USE AS DIRECTED UP TO FOUR TIMES DAILY 102 each 0   ACCU-CHEK GUIDE test strip USE TO TEST AS DIRECTED UP TO 4 TIMES DAILY 100 strip 2   albuterol (PROVENTIL) (2.5 MG/3ML) 0.083% nebulizer solution Take 3 mLs (2.5 mg total) by nebulization 2 (two) times daily as needed for wheezing or shortness of breath. 150 mL 1   albuterol (VENTOLIN HFA) 108 (90 Base) MCG/ACT inhaler Inhale 2 puffs into the lungs every 6 (six) hours as needed for wheezing or shortness of breath. 1 each 5   atenolol (TENORMIN) 100 MG tablet Take 100 mg by mouth at bedtime.     benazepril (LOTENSIN) 40 MG tablet Take 40 mg by mouth in the morning and at bedtime.     blood glucose meter kit and supplies KIT Dispense based on patient  and insurance preference. Use up to four times daily as directed. 1 each 0   clopidogrel (PLAVIX) 75 MG tablet Take 75 mg by mouth daily.     escitalopram (LEXAPRO) 10 MG tablet Take 10 mg by mouth at bedtime.     FARXIGA 10 MG TABS tablet Take 10 mg by mouth in the morning.     furosemide (LASIX) 40 MG tablet Take 1 tablet (40 mg total) by mouth daily as needed. (Patient taking differently: Take 40 mg by mouth daily as needed for edema or fluid.) 30 tablet 3   insulin glargine (LANTUS SOLOSTAR) 100 UNIT/ML Solostar Pen Inject 100 Units into the skin daily.     Insulin Pen Needle 32G X 4 MM MISC 50 each by Does not apply route in the morning and at bedtime. 50  each 3   ipratropium (ATROVENT) 0.02 % nebulizer solution Take 2.5 mLs (0.5 mg total) by nebulization 2 (two) times daily as needed for wheezing or shortness of breath. 75 mL 12   KERENDIA 20 MG TABS Take 20 mg by mouth in the morning.     mycophenolate (CELLCEPT) 500 MG tablet Take 500 mg by mouth in the morning and at bedtime.     nicotine (NICODERM CQ - DOSED IN MG/24 HOURS) 21 mg/24hr patch Place 21 mg onto the skin daily.     Omega-3 Fatty Acids (FISH OIL) 1000 MG CAPS Take 1,000 mg by mouth in the morning and at bedtime.     OZEMPIC, 1 MG/DOSE, 4 MG/3ML SOPN Inject 1 mg into the skin every Wednesday.     predniSONE (DELTASONE) 5 MG tablet Take 5 mg by mouth daily with breakfast.     rosuvastatin (CRESTOR) 20 MG tablet Take 20 mg by mouth daily.     rosuvastatin (CRESTOR) 40 MG tablet Take 1 tablet (40 mg total) by mouth at bedtime. 30 tablet 0   sodium bicarbonate 650 MG tablet Take 1,300 mg by mouth 3 (three) times daily.     tacrolimus (PROGRAF) 1 MG capsule Take 1 mg by mouth in the morning and at bedtime.     Tetrahydroz-Dextran-PEG-Povid (VISINE ADVANCED RELIEF OP) Place 1 drop into both eyes 3 (three) times daily as needed (for dryness).     TRESIBA FLEXTOUCH 100 UNIT/ML FlexTouch Pen Inject 13 Units into the skin at bedtime.     zolpidem (AMBIEN) 10 MG tablet Take 10 mg by mouth at bedtime as needed for sleep.     No current facility-administered medications for this visit.     Family History    Family History  Problem Relation Age of Onset   Hypertension Mother    Other Father 14       amyloidosis   Healthy Brother    Stroke Paternal Grandmother    She indicated that her mother is alive. She indicated that her father is deceased. She indicated that both of her brothers are alive. She indicated that the status of her paternal grandmother is unknown. She indicated that her daughter is alive.  Social History    Social History   Socioeconomic History   Marital status:  Married    Spouse name: Not on file   Number of children: 1   Years of education: Not on file   Highest education level: Not on file  Occupational History   Occupation: Research officer, trade union to hearing impaired    Employer: [employer]  Tobacco Use   Smoking status: Every Day  Current packs/day: 0.50    Average packs/day: 0.5 packs/day for 1.1 years (0.5 ttl pk-yrs)    Types: Cigarettes    Start date: 12/22/1985    Last attempt to quit: 12/23/2015   Smokeless tobacco: Never  Vaping Use   Vaping status: Never Used  Substance and Sexual Activity   Alcohol use: Yes    Alcohol/week: 1.0 standard drink of alcohol    Types: 1 Glasses of wine per week    Comment: weekends occasional   Drug use: No   Sexual activity: Yes  Other Topics Concern   Not on file  Social History Narrative   Not on file   Social Drivers of Health   Financial Resource Strain: Not on file  Food Insecurity: No Food Insecurity (01/11/2023)   Hunger Vital Sign    Worried About Running Out of Food in the Last Year: Never true    Ran Out of Food in the Last Year: Never true  Transportation Needs: No Transportation Needs (01/11/2023)   PRAPARE - Administrator, Civil Service (Medical): No    Lack of Transportation (Non-Medical): No  Physical Activity: Not on file  Stress: Not on file  Social Connections: Not on file  Intimate Partner Violence: Not At Risk (01/11/2023)   Humiliation, Afraid, Rape, and Kick questionnaire    Fear of Current or Ex-Partner: No    Emotionally Abused: No    Physically Abused: No    Sexually Abused: No     Review of Systems    General:  No chills, fever, night sweats or weight changes.  Cardiovascular:  No chest pain, dyspnea on exertion, edema, orthopnea, palpitations, paroxysmal nocturnal dyspnea. Dermatological: No rash, lesions/masses Respiratory: No cough, dyspnea Urologic: No hematuria, dysuria Abdominal:   No nausea, vomiting, diarrhea, bright red  blood per rectum, melena, or hematemesis Neurologic:  No visual changes, wkns, changes in mental status. All other systems reviewed and are otherwise negative except as noted above.       Physical Exam    VS:  LMP 10/24/2012  , BMI There is no height or weight on file to calculate BMI.     Physical Exam Gen: well appearing Neuro: alert and oriented CV: r,r,r no murmurs.  Pulm: nl wob, + wheezing Abd: non distended Ext: No LE edema Skin: warm and well perfused Psych: normal mood       Lab Results  Component Value Date   WBC 8.2 01/12/2023   HGB 16.4 (H) 01/12/2023   HCT 50.3 (H) 01/12/2023   MCV 90.6 01/12/2023   PLT 198 01/12/2023   Lab Results  Component Value Date   CREATININE 2.28 (H) 01/13/2023   BUN 29 (H) 01/13/2023   NA 140 01/13/2023   K 3.6 01/13/2023   CL 106 01/13/2023   CO2 23 01/13/2023   Lab Results  Component Value Date   ALT 21 01/11/2023   AST 20 01/11/2023   ALKPHOS 84 01/11/2023   BILITOT 0.8 01/11/2023   Lab Results  Component Value Date   CHOL 112 01/12/2023   HDL 30 (L) 01/12/2023   LDLCALC 29 01/12/2023   LDLDIRECT 45.0 06/24/2021   TRIG 266 (H) 01/12/2023   CHOLHDL 3.7 01/12/2023    Lab Results  Component Value Date   HGBA1C 7.5 (H) 01/11/2023     Review of Prior Studies    TEE 01/13/2023 1. Left ventricular ejection fraction, by estimation, is 65 to 70%. The  left ventricle has normal function. There  is severe concentric left  ventricular hypertrophy.   2. Right ventricular systolic function is normal. The right ventricular  size is normal. Mildly increased right ventricular wall thickness.   3. Appendage assessment imaging did not save to study images. No left  atrial/left atrial appendage thrombus was detected.   4. A small pericardial effusion is present. The pericardial effusion is  circumferential.   5. Billowing without proplapse. The mitral valve is grossly normal. No  evidence of mitral valve regurgitation. No  evidence of mitral stenosis.   6. The aortic valve is tricuspid. Aortic valve regurgitation is not  visualized. No aortic stenosis is present.   7. The inferior vena cava is normal in size with greater than 50%  respiratory variability, suggesting right atrial pressure of 3 mmHg.   Carotid Ultrasound 01/18/2023 Summary:  Right Carotid: Velocities in the right ICA are consistent with a 1-39%  stenosis.   Left Carotid: Velocities in the left ICA are consistent with a 1-39%  stenosis.   Vertebrals: Bilateral vertebral arteries demonstrate antegrade flow.  Subclavians: Normal flow hemodynamics were seen in bilateral subclavian               arteries.    Assessment & Plan   1.  Hypertension: Currently well-controlled.  Continue benazepril 40 mg daily, atenolol 100 mg at bedtime low-sodium diet and exercise was recommended for maintenance of blood pressure.  2.  Status post acute left frontal infarct: Continue plavix 75 mg daily only aspirin has been discontinued.  3.  Hypertensive cardiomyopathy: Severe LVH per echocardiogram.  I have explained this to her and provided illustration so that she is aware of the need to maintain blood pressure control, and to avoid worsening cardiomyopathy.  She verbalizes understanding.  4. Hypercholesterolemia.  Cont. rosuvastatin 40 mg daily.  LDL is at goal  5.  Status post kidney transplant c/b CKD stage IV : Followed by nephrology ongoing labs and management per them.Renally dose medications  6. DM2 A1c 7.4: goal A1c < 7. On farxiga.   7. Tobacco Abuse: patches did not work.  She has a prescription for Wellbutrin, she will try this  PreOp: for hemorroid surgery, can hold plavix 3-5 days prior to any procedure       Follow up in 1 year with an APP

## 2023-07-04 ENCOUNTER — Telehealth: Payer: Self-pay | Admitting: Neurology

## 2023-07-04 NOTE — Telephone Encounter (Signed)
 Noted.

## 2023-07-04 NOTE — Telephone Encounter (Signed)
 Pt has been scheduled for her initial CPAP f/u, pt told to bring CPAP and power cord

## 2023-08-09 NOTE — Progress Notes (Unsigned)
 Marland Kitchen

## 2023-08-10 ENCOUNTER — Ambulatory Visit: Payer: Self-pay | Admitting: Neurology

## 2023-08-10 ENCOUNTER — Encounter: Payer: Self-pay | Admitting: Neurology

## 2023-08-10 VITALS — BP 121/86 | HR 63 | Ht 63.0 in | Wt 139.6 lb

## 2023-08-10 DIAGNOSIS — F17200 Nicotine dependence, unspecified, uncomplicated: Secondary | ICD-10-CM

## 2023-08-10 DIAGNOSIS — Z720 Tobacco use: Secondary | ICD-10-CM | POA: Insufficient documentation

## 2023-08-10 DIAGNOSIS — I639 Cerebral infarction, unspecified: Secondary | ICD-10-CM | POA: Diagnosis not present

## 2023-08-10 DIAGNOSIS — G4733 Obstructive sleep apnea (adult) (pediatric): Secondary | ICD-10-CM | POA: Insufficient documentation

## 2023-08-10 MED ORDER — BUPROPION HCL ER (XL) 150 MG PO TB24: 150.0000 mg | ORAL_TABLET | Freq: Every day | ORAL | 3 refills | Status: AC

## 2023-08-10 NOTE — Patient Instructions (Signed)
 Bupropion Tablets (Depression/Mood Disorders) What is this medication? BUPROPION (byoo PROE pee on) treats depression. It increases norepinephrine and dopamine in the brain, hormones that help regulate mood. It belongs to a group of medications called NDRIs. This medicine may be used for other purposes; ask your health care provider or pharmacist if you have questions. COMMON BRAND NAME(S): Wellbutrin What should I tell my care team before I take this medication? They need to know if you have any of these conditions: An eating disorder, such as anorexia or bulimia Bipolar disorder or psychosis Diabetes or high blood sugar, treated with medication Glaucoma Head injury or brain tumor Heart disease, previous heart attack, or irregular heart beat High blood pressure Kidney disease Liver disease Seizures Suicidal thoughts, plans, or attempt by you or a family member Tourette syndrome Weight loss An unusual or allergic reaction to bupropion, other medications, foods, dyes, or preservatives Pregnant or trying to become pregnant Breastfeeding How should I use this medication? Take this medication by mouth with a glass of water. Follow the directions on the prescription label. You can take it with or without food. If it upsets your stomach, take it with food. Take your medication at regular intervals. Do not take your medication more often than directed. Do not stop taking this medication suddenly except upon the advice of your care team. Stopping this medication too quickly may cause serious side effects or your condition may worsen. A special MedGuide will be given to you by the pharmacist with each prescription and refill. Be sure to read this information carefully each time. Talk to your care team regarding the use of this medication in children. Special care may be needed. Overdosage: If you think you have taken too much of this medicine contact a poison control center or emergency room at  once. NOTE: This medicine is only for you. Do not share this medicine with others. What if I miss a dose? If you miss a dose, take it as soon as you can. If it is less than four hours to your next dose, take only that dose and skip the missed dose. Do not take double or extra doses. What may interact with this medication? Do not take this medication with any of the following: Linezolid MAOIs, such as Azilect, Carbex, Eldepryl, Marplan, Nardil, and Parnate Methylene blue (injected into a vein) Other medications that contain bupropion, such as Zyban This medication may also interact with the following: Alcohol Certain medications for anxiety or sleep Certain medications for blood pressure, such as metoprolol, propranolol Certain medications for HIV or AIDS, such as efavirenz, lopinavir, nelfinavir, ritonavir Certain medications for irregular heartbeat, such as propafenone, flecainide Certain medications for mental health conditions Certain medications for Parkinson disease, such as amantadine, levodopa Certain medications for seizures, such as carbamazepine, phenytoin, phenobarbital Cimetidine Clopidogrel Cyclophosphamide Digoxin Furazolidone Isoniazid Nicotine Orphenadrine Procarbazine Steroid medications, such as prednisone or cortisone Stimulant medications for attention disorders, weight loss, or to stay awake Tamoxifen Theophylline Thiotepa Ticlopidine Tramadol Warfarin This list may not describe all possible interactions. Give your health care provider a list of all the medicines, herbs, non-prescription drugs, or dietary supplements you use. Also tell them if you smoke, drink alcohol, or use illegal drugs. Some items may interact with your medicine. What should I watch for while using this medication? Tell your care team if your symptoms do not get better or if they get worse. Visit your care team for regular checks on your progress. Because it may take several  weeks to see  the full effects of this medication, it is important to continue your treatment as prescribed. This medication may cause thoughts of suicide or depression. This includes sudden changes in mood, behaviors, or thoughts. These changes can happen at any time but are more common in the beginning of treatment or after a change in dose. Call your care team right away if you experience these thoughts or worsening depression. This medication may cause mood and behavior changes, such as anxiety, nervousness, irritability, hostility, restlessness, excitability, hyperactivity, or trouble sleeping. These changes can happen at any time but are more common in the beginning of treatment or after a change in dose. Call your care team right away if you notice any of these symptoms. This medication may cause serious skin reactions. They can happen weeks to months after starting the medication. Contact your care team right away if you notice fevers or flu-like symptoms with a rash. The rash may be red or purple and then turn into blisters or peeling of the skin. You may also notice a red rash with swelling of the face, lips, or lymph nodes in your neck or under your arms. Avoid drinks that contain alcohol while taking this medication. Drinking large amounts of alcohol, using sleeping or anxiety medications, or quickly stopping the use of these agents while taking this medication may increase your risk for a seizure. This medication may affect your coordination, reaction time, or judgment. Do not drive or operate machinery until you know how this medication affects you. Do not take this medication close to bedtime. It may prevent you from sleeping. Your mouth may get dry. Chewing sugarless gum or sucking hard candy and drinking plenty of water may help. Contact your care team if the problem does not go away or is severe. What side effects may I notice from receiving this medication? Side effects that you should report to your  care team as soon as possible: Allergic reactions--skin rash, itching, hives, swelling of the face, lips, tongue, or throat Increase in blood pressure Mood and behavior changes--anxiety, nervousness, confusion, hallucinations, irritability, hostility, thoughts of suicide or self-harm, worsening mood, feelings of depression Redness, blistering, peeling, or loosening of the skin, including inside the mouth Seizures Sudden eye pain or change in vision such as blurry vision, seeing halos around lights, vision loss Side effects that usually do not require medical attention (report to your care team if they continue or are bothersome): Constipation Dizziness Dry mouth Loss of appetite Nausea Tremors or shaking Trouble sleeping This list may not describe all possible side effects. Call your doctor for medical advice about side effects. You may report side effects to FDA at 1-800-FDA-1088. Where should I keep my medication? Keep out of the reach of children and pets. Store at room temperature between 20 and 25 degrees C (68 and 77 degrees F), away from direct sunlight and moisture. Keep tightly closed. Throw away any unused medication after the expiration date. NOTE: This sheet is a summary. It may not cover all possible information. If you have questions about this medicine, talk to your doctor, pharmacist, or health care provider.  2024 Elsevier/Gold Standard (2022-01-31 00:00:00)  Steps to Quit Smoking Smoking tobacco is the leading cause of preventable death. It can affect almost every organ in the body. Smoking puts you and people around you at risk for many serious, long-lasting (chronic) diseases. Quitting smoking can be hard, but it is one of the best things that you can do for  your health. It is never too late to quit. Do not give up if you cannot quit the first time. Some people need to try many times to quit. Do your best to stick to your quit plan, and talk with your doctor if you have  any questions or concerns. How do I get ready to quit? Pick a date to quit. Set a date within the next 2 weeks to give you time to prepare. Write down the reasons why you are quitting. Keep this list in places where you will see it often. Tell your family, friends, and co-workers that you are quitting. Their support is important. Talk with your doctor about the choices that may help you quit. Find out if your health insurance will pay for these treatments. Know the people, places, things, and activities that make you want to smoke (triggers). Avoid them. What first steps can I take to quit smoking? Throw away all cigarettes at home, at work, and in your car. Throw away the things that you use when you smoke, such as ashtrays and lighters. Clean your car. Empty the ashtray. Clean your home, including curtains and carpets. What can I do to help me quit smoking? Talk with your doctor about taking medicines and seeing a counselor. You are more likely to succeed when you do both. If you are pregnant or breastfeeding: Talk with your doctor about counseling or other ways to quit smoking. Do not take medicine to help you quit smoking unless your doctor tells you to. Quit right away Quit smoking completely, instead of slowly cutting back on how much you smoke over a period of time. Stopping smoking right away may be more successful than slowly quitting. Go to counseling. In-person is best if this is an option. You are more likely to quit if you go to counseling sessions regularly. Take medicine You may take medicines to help you quit. Some medicines need a prescription, and some you can buy over-the-counter. Some medicines may contain a drug called nicotine to replace the nicotine in cigarettes. Medicines may: Help you stop having the desire to smoke (cravings). Help to stop the problems that come when you stop smoking (withdrawal symptoms). Your doctor may ask you to use: Nicotine patches, gum, or  lozenges. Nicotine inhalers or sprays. Non-nicotine medicine that you take by mouth. Find resources Find resources and other ways to help you quit smoking and remain smoke-free after you quit. They include: Online chats with a Veterinary surgeon. Phone quitlines. Printed Materials engineer. Support groups or group counseling. Text messaging programs. Mobile phone apps. Use apps on your mobile phone or tablet that can help you stick to your quit plan. Examples of free services include Quit Guide from the CDC and smokefree.gov  What can I do to make it easier to quit?  Talk to your family and friends. Ask them to support and encourage you. Call a phone quitline, such as 1-800-QUIT-NOW, reach out to support groups, or work with a Veterinary surgeon. Ask people who smoke to not smoke around you. Avoid places that make you want to smoke, such as: Bars. Parties. Smoke-break areas at work. Spend time with people who do not smoke. Lower the stress in your life. Stress can make you want to smoke. Try these things to lower stress: Getting regular exercise. Doing deep-breathing exercises. Doing yoga. Meditating. What benefits will I see if I quit smoking? Over time, you may have: A better sense of smell and taste. Less coughing and sore throat. A  slower heart rate. Lower blood pressure. Clearer skin. Better breathing. Fewer sick days. Summary Quitting smoking can be hard, but it is one of the best things that you can do for your health. Do not give up if you cannot quit the first time. Some people need to try many times to quit. When you decide to quit smoking, make a plan to help you succeed. Quit smoking right away, not slowly over a period of time. When you start quitting, get help and support to keep you smoke-free. This information is not intended to replace advice given to you by your health care provider. Make sure you discuss any questions you have with your health care provider. Document  Revised: 05/01/2021 Document Reviewed: 05/01/2021 Elsevier Patient Education  2024 [employer].

## 2023-08-10 NOTE — Progress Notes (Signed)
 Provider:  Melvyn Novas, MD    SLEEP CLINIC FOLLOW UP<    Primary Care Physician:  Aliene Beams, MD [phone] [address] [place] [address]     Referring Provider: Dr Pearlean Brownie, MD         Chief Complaint according to patient   Patient presents with:                HISTORY OF PRESENT ILLNESS:  Isabella Obrien is a 58 y.o. female patient who is here for revisit 08/10/2023.  Had the pleasure of seeing Mrs. Pham today in her first visit after her sleep study she underwent a home sleep test and was referred by Dr. Darien Ramus who is a stroke specialist.  She had told me that she could not sleep on her back because she felt that she would choke and she had also quite a bit of menopausal hot flushes already for about a decade.  She had been the recipient of a kidney transplant and in 08/17/04 and being on prednisone she became a steroid-induced diabetic.  Her initial kidney disease with glomerulonephritis.  She had a stroke in late August 2024.  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  Her Zio patch did not show any arrhythmia her echo was normal with an ejection fraction between 55 and 60%, and TEE showed septal hypertrophy.  The patient had endorsed the Epworth sleepiness score at the time of 6 points the fatigue severity score at 45 points and her BMI was 25.8.  She was tested and slept over 6 hours and 31 minutes on a home sleep test device with about 31% REM sleep which is high.  Her calculated AHI was 30.7/h and was higher in REM than in non-REM sleep.  She slept for the majority of the night on her back in supine and her oxygen nadir was 85% she had only 1.2 minutes of total desaturation time her pulse ranged was in normal range between 52 and 126 bpm.  He started treatment in an auto titration CPAP device between 5 and 15 cm water with 2 cm EPR and heated humidification. She chose a nasal mask and likes this.  She uses a chin strap with it.    She  still has a slight left facial droop.  She is on prograf and Cellcept     Isabella Obrien is a 58 y.o. female patient who is seen upon stroke team referral on 03/16/2023  for a Consult .  Chief concern according to patient :  " I can't sleep on my back because it will cause me to choke, and I have menopausal hot flushes for about 12 years , not on HRT. 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    The patient is here to have a sleep study - after her Zio patch didn't show arrhythmia. Her echo was normal , EF 55-60% and TEE showed septal hypertrophy. Labs were reviewed in her presence.    Sleep relevant medical history:  Nocturia, on diuretics, RLS while on Benadryl, seasonal colds  Family medical /sleep history: father  ( deceased 2014-08-18  of Amyloidosis) and brother on CPAP with OSA, no insomnia, no sleep walkers.    Social history:  Patient is working as a Geologist, 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[employer]  and lives in a household with spouse and daughter  , 2 dogs  The patient currently works/ Tobacco use; from 7th grade - and still smokes.   ETOH use ;rare   Caffeine intake in form of Coffee( none ) Soda( in AM ) Tea ( /) or energy drinks Exercise in form of  walking, but no routine.       Sleep habits are as follows: The patient's dinner time is between 6-6.30 PM. The patient goes to bed at 10 PM and listens to a pod cast,  continues to sleep for 6 hours, wakes for 2-3 bathroom breaks, the first time at 2, 4 , 5.30 AM.    Dr Pearlean Brownie, MD : frontal stroke occurred on 10 January 2023 so fairly recently.  The risk factors for stroke are actually a steroid-induced diabetes mellitus related to the patient's posttransplant care, history of  glomerulonephritis which requires and kidney transplant although it already 18 years ago, and the patient has been on CellCept and Prograf, modifying her immune system.  She has had good control of blood pressure.  The patient has a risk factor of smoking, which will address the vascular constriction.  Her echocardiogram showed a thickening of the septal wall of the heart but her ejection fraction was estimated to be between 55 and 60%.  She did not have cardiac arrhythmias on telemetry.  The main cause for her apnea induced strokes is via atrial fibrillation and the pathway would be an embolism.     Review of Systems: Out of a complete 14 system review, the patient complains of only the following symptoms, and all other reviewed systems are negative.:   Epworth sleepiness score 2/ 24 and FSS is now 16/ 63 points.    Social History   Socioeconomic History   Marital status: Married    Spouse name: Not on file   Number of children: 1   Years of education: Not on file   Highest education level: Not on file  Occupational History   Occupation: Research officer, trade union to hearing impaired    Employer: [employer]  Tobacco Use   Smoking status: Every Day    Current packs/day: 0.50    Average packs/day: 0.5 packs/day for 1.2 years (0.6 ttl pk-yrs)    Types: Cigarettes    Start date: 12/22/1985    Last attempt to quit: 12/23/2015   Smokeless tobacco: Never   Tobacco comments:    Pt. Smokes a pack a day, is ready to quit  Vaping Use   Vaping status: Never Used  Substance and Sexual Activity   Alcohol use: Yes    Alcohol/week: 1.0 standard drink of alcohol    Types: 1 Glasses of wine per week    Comment: weekends occasional   Drug use: No   Sexual activity: Yes  Other Topics Concern   Not on file  Social History Narrative   Not on file   Social Drivers of Health   Financial Resource Strain: Not on file  Food Insecurity: No Food Insecurity (01/11/2023)   Hunger Vital Sign     Worried About Running Out of Food in the Last Year: Never true    Ran Out of Food in the Last Year: Never true  Transportation Needs: No Transportation Needs (01/11/2023)   PRAPARE - Transportation  Lack of Transportation (Medical): No    Lack of Transportation (Non-Medical): No  Physical Activity: Not on file  Stress: Not on file  Social Connections: Not on file    Family History  Problem Relation Age of Onset   Hypertension Mother    Other Father 66       amyloidosis   Healthy Brother    Stroke Paternal Grandmother     Past Medical History:  Diagnosis Date   Chronic kidney disease    has kidney transplant - having aphoresis now every other week   Diabetes mellitus without complication (HCC)    dm type 2 - fasting 150   History of blood product transfusion    Hyperlipidemia    Hypertension    Menopausal hot flushes 09/06/2017   Controlled on Lexapro by Dr. Rana Snare   Neuromuscular disorder [facility])     Past Surgical History:  Procedure Laterality Date   COLONOSCOPY     diateck catheter placement  09/2013   KIDNEY TRANSPLANT     ORIF ANKLE FRACTURE Left 2014   TEE WITHOUT CARDIOVERSION N/A 01/13/2023   Procedure: TRANSESOPHAGEAL ECHOCARDIOGRAM;  Surgeon: Christell Constant, MD;  Location: [facility] INVASIVE CV LAB;  Service: Cardiovascular;  Laterality: N/A;     Current Outpatient Medications on File Prior to Visit  Medication Sig Dispense Refill   Accu-Chek FastClix Lancets MISC USE AS DIRECTED UP TO FOUR TIMES DAILY 102 each 0   ACCU-CHEK GUIDE test strip USE TO TEST AS DIRECTED UP TO 4 TIMES DAILY 100 strip 2   albuterol (PROVENTIL) (2.5 MG/3ML) 0.083% nebulizer solution Take 3 mLs (2.5 mg total) by nebulization 2 (two) times daily as needed for wheezing or shortness of breath. 150 mL 1   albuterol (VENTOLIN HFA) 108 (90 Base) MCG/ACT inhaler Inhale 2 puffs into the lungs every 6 (six) hours as needed for wheezing or shortness of breath. 1 each 5   atenolol (TENORMIN) 100 MG  tablet Take 100 mg by mouth at bedtime.     benazepril (LOTENSIN) 40 MG tablet Take 40 mg by mouth in the morning and at bedtime.     blood glucose meter kit and supplies KIT Dispense based on patient and insurance preference. Use up to four times daily as directed. 1 each 0   clopidogrel (PLAVIX) 75 MG tablet Take 75 mg by mouth daily.     escitalopram (LEXAPRO) 10 MG tablet Take 10 mg by mouth at bedtime.     FARXIGA 10 MG TABS tablet Take 10 mg by mouth in the morning.     furosemide (LASIX) 40 MG tablet Take 1 tablet (40 mg total) by mouth daily as needed. (Patient taking differently: Take 40 mg by mouth daily as needed for edema or fluid.) 30 tablet 3   insulin glargine (LANTUS SOLOSTAR) 100 UNIT/ML Solostar Pen Inject 100 Units into the skin daily.     Insulin Pen Needle 32G X 4 MM MISC 50 each by Does not apply route in the morning and at bedtime. 50 each 3   ipratropium (ATROVENT) 0.02 % nebulizer solution Take 2.5 mLs (0.5 mg total) by nebulization 2 (two) times daily as needed for wheezing or shortness of breath. 75 mL 12   KERENDIA 20 MG TABS Take 20 mg by mouth in the morning.     mycophenolate (CELLCEPT) 500 MG tablet Take 500 mg by mouth in the morning and at bedtime.     nicotine (NICODERM CQ - DOSED IN MG/24 HOURS) 21  mg/24hr patch Place 21 mg onto the skin daily.     Omega-3 Fatty Acids (FISH OIL) 1000 MG CAPS Take 1,000 mg by mouth in the morning and at bedtime.     OZEMPIC, 1 MG/DOSE, 4 MG/3ML SOPN Inject 1 mg into the skin every Wednesday.     predniSONE (DELTASONE) 5 MG tablet Take 5 mg by mouth daily with breakfast.     rosuvastatin (CRESTOR) 40 MG tablet Take 1 tablet (40 mg total) by mouth at bedtime. 30 tablet 0   sodium bicarbonate 650 MG tablet Take 1,300 mg by mouth 3 (three) times daily.     tacrolimus (PROGRAF) 1 MG capsule Take 1 mg by mouth in the morning and at bedtime.     Tetrahydroz-Dextran-PEG-Povid (VISINE ADVANCED RELIEF OP) Place 1 drop into both eyes 3  (three) times daily as needed (for dryness).     TRESIBA FLEXTOUCH 100 UNIT/ML FlexTouch Pen Inject 13 Units into the skin at bedtime.     zolpidem (AMBIEN) 10 MG tablet Take 10 mg by mouth at bedtime as needed for sleep.     rosuvastatin (CRESTOR) 20 MG tablet Take 20 mg by mouth daily. (Patient not taking: Reported on 08/10/2023)     No current facility-administered medications on file prior to visit.    Allergies  Allergen Reactions   Cefuroxime Swelling and Other (See Comments)    Ceftin   Benadryl [Diphenhydramine Hcl] Swelling, Anxiety and Other (See Comments)    Hyperactivity and angioedema, also   Tape Rash and Other (See Comments)    Paper tape is okay   Wound Dressing Adhesive Rash and Other (See Comments)    Paper tape is ok     DIAGNOSTIC DATA (LABS, IMAGING, TESTING) - I reviewed patient records, labs, notes, testing and imaging myself where available.  Lab Results  Component Value Date   WBC 8.2 01/12/2023   HGB 16.4 (H) 01/12/2023   HCT 50.3 (H) 01/12/2023   MCV 90.6 01/12/2023   PLT 198 01/12/2023      Component Value Date/Time   NA 140 01/13/2023 0312   NA 137 05/26/2018 0000   K 3.6 01/13/2023 0312   CL 106 01/13/2023 0312   CO2 23 01/13/2023 0312   GLUCOSE 103 (H) 01/13/2023 0312   BUN 29 (H) 01/13/2023 0312   BUN 22 (A) 05/26/2018 0000   CREATININE 2.28 (H) 01/13/2023 0312   CREATININE 1.28 (H) 01/25/2020 1137   CALCIUM 8.7 (L) 01/13/2023 0312   PROT 6.2 (L) 01/11/2023 1000   ALBUMIN 3.4 (L) 01/11/2023 1000   AST 20 01/11/2023 1000   ALT 21 01/11/2023 1000   ALKPHOS 84 01/11/2023 1000   BILITOT 0.8 01/11/2023 1000   GFRNONAA 25 (L) 01/13/2023 0312   GFRNONAA 48 (L) 01/25/2020 1137   GFRAA 56 (L) 01/25/2020 1137   Lab Results  Component Value Date   CHOL 112 01/12/2023   HDL 30 (L) 01/12/2023   LDLCALC 29 01/12/2023   LDLDIRECT 45.0 06/24/2021   TRIG 266 (H) 01/12/2023   CHOLHDL 3.7 01/12/2023   Lab Results  Component Value Date    HGBA1C 7.5 (H) 01/11/2023   No results found for: "VITAMINB12" Lab Results  Component Value Date   TSH 1.16 01/25/2020    PHYSICAL EXAM:  Today's Vitals   08/10/23 1325  BP: 121/86  Pulse: 63  Weight: 139 lb 9.6 oz (63.3 kg)  Height: 5\' 3"  (1.6 m)   Body mass index is 24.73 kg/m.  Wt Readings from Last 3 Encounters:  08/10/23 139 lb 9.6 oz (63.3 kg)  06/27/23 139 lb (63 kg)  03/16/23 145 lb 12.8 oz (66.1 kg)     Ht Readings from Last 3 Encounters:  08/10/23 5\' 3"  (1.6 m)  06/27/23 5\' 3"  (1.6 m)  03/16/23 5\' 3"  (1.6 m)      General: The patient is awake, alert and appears not in acute distress. The patient is well groomed. Head: Normocephalic, atraumatic. She appears older than her numeric age,  The patient is well groomed. Head: Normocephalic, atraumatic. Neck is supple. Mallampati 3,  neck circumference:14 inches .  Nasal airflow not fully patent.   Retrognathia is not seen.  Dental status: biological  Cardiovascular:  Regular rate and cardiac rhythm by pulse,  without distended neck veins. Respiratory: Lungs are clear to auscultation.  Skin:  Without evidence of ankle edema, or rash. Trunk: The patient's posture is erect.   NEUROLOGIC EXAM: The patient is awake and alert, oriented to place and time.   Memory subjective described as intact.  Attention span & concentration ability appears normal.  Speech is fluent, without dysarthria, dysphonia or aphasia.  Mood and affect are appropriate.   Cranial nerves: no loss of smell or taste reported  Pupils are unequal , left larger - and not briskly reactive to light. Funduscopic exam deferred.  Extraocular movements in vertical and horizontal planes were intact and without nystagmus. No Diplopia. Visual fields by finger perimetry are intact. Hearing was intact to soft voice and finger rubbing.   Facial sensation intact to fine touch. Facial motor strength is asymmetric , droopy on the  left angle of the mouth -  but  tongue and uvula move midline.  Neck ROM : rotation, tilt and flexion extension were normal for age and shoulder shrug was symmetrical.    Motor exam:  Symmetric bulk, tone and ROM.   Normal tone without cog wheeling, reduced but symmetric grip strength .      ASSESSMENT AND PLAN 58 y.o. year old female  here with:    1) severe OSA per HST , now on auto CPAP and actually feeling a good impact on her fatigue score and better quality sleep.   2) her main risk factor is smoking !  I like for her to stop and try wellbutrin.     3)  CPAP settings are working well, I like for her to try a different chin strap       I plan to follow up through   Dr Pearlean Brownie , MD or NP Ethelene Browns  within 12 months.   I would like to thank Aliene Beams, MD and Aliene Beams, [place] [address] [place],  [place] [address] for allowing me to meet with and to take care of this pleasant patient.     After spending a total time of  21  minutes face to face and additional time for physical and neurologic examination, review of laboratory studies,  personal review of imaging studies, reports and results of other testing and review of referral information / records as far as provided in visit,   Electronically signed by: Melvyn Novas, MD 08/10/2023 1:36 PM  [facility] Board certified by The [employer] of Sleep Medicine and Diplomate of the [employer] of Sleep Medicine. Board certified In Neurology through the ABPN, Fellow of the [employer] of Neu[employer].

## 2023-10-13 ENCOUNTER — Encounter (HOSPITAL_COMMUNITY): Payer: Self-pay

## 2023-10-13 ENCOUNTER — Other Ambulatory Visit: Payer: Self-pay

## 2023-10-13 ENCOUNTER — Emergency Department (HOSPITAL_COMMUNITY)

## 2023-10-13 ENCOUNTER — Inpatient Hospital Stay (HOSPITAL_COMMUNITY)
Admission: EM | Admit: 2023-10-13 | Discharge: 2023-10-14 | DRG: 065 | Disposition: A | Discharge status: Home / Self Care | Attending: Internal Medicine | Admitting: Internal Medicine

## 2023-10-13 ENCOUNTER — Inpatient Hospital Stay (HOSPITAL_COMMUNITY)

## 2023-10-13 DIAGNOSIS — Z823 Family history of stroke: Secondary | ICD-10-CM

## 2023-10-13 DIAGNOSIS — Z794 Long term (current) use of insulin: Secondary | ICD-10-CM | POA: Diagnosis not present

## 2023-10-13 DIAGNOSIS — N1832 Chronic kidney disease, stage 3b: Secondary | ICD-10-CM

## 2023-10-13 DIAGNOSIS — I6389 Other cerebral infarction: Secondary | ICD-10-CM | POA: Diagnosis not present

## 2023-10-13 DIAGNOSIS — R29701 NIHSS score 1: Secondary | ICD-10-CM | POA: Diagnosis not present

## 2023-10-13 DIAGNOSIS — N184 Chronic kidney disease, stage 4 (severe): Secondary | ICD-10-CM | POA: Diagnosis present

## 2023-10-13 DIAGNOSIS — I6381 Other cerebral infarction due to occlusion or stenosis of small artery: Principal | ICD-10-CM | POA: Diagnosis present

## 2023-10-13 DIAGNOSIS — Z7902 Long term (current) use of antithrombotics/antiplatelets: Secondary | ICD-10-CM

## 2023-10-13 DIAGNOSIS — Z7984 Long term (current) use of oral hypoglycemic drugs: Secondary | ICD-10-CM | POA: Diagnosis not present

## 2023-10-13 DIAGNOSIS — E1169 Type 2 diabetes mellitus with other specified complication: Secondary | ICD-10-CM | POA: Diagnosis present

## 2023-10-13 DIAGNOSIS — D649 Anemia, unspecified: Secondary | ICD-10-CM | POA: Diagnosis present

## 2023-10-13 DIAGNOSIS — Z7982 Long term (current) use of aspirin: Secondary | ICD-10-CM

## 2023-10-13 DIAGNOSIS — R296 Repeated falls: Secondary | ICD-10-CM | POA: Diagnosis present

## 2023-10-13 DIAGNOSIS — E785 Hyperlipidemia, unspecified: Secondary | ICD-10-CM | POA: Diagnosis not present

## 2023-10-13 DIAGNOSIS — G4733 Obstructive sleep apnea (adult) (pediatric): Secondary | ICD-10-CM | POA: Diagnosis present

## 2023-10-13 DIAGNOSIS — Z79899 Other long term (current) drug therapy: Secondary | ICD-10-CM | POA: Diagnosis not present

## 2023-10-13 DIAGNOSIS — I639 Cerebral infarction, unspecified: Principal | ICD-10-CM | POA: Diagnosis present

## 2023-10-13 DIAGNOSIS — N051 Unspecified nephritic syndrome with focal and segmental glomerular lesions: Secondary | ICD-10-CM | POA: Diagnosis not present

## 2023-10-13 DIAGNOSIS — Z7985 Long-term (current) use of injectable non-insulin antidiabetic drugs: Secondary | ICD-10-CM

## 2023-10-13 DIAGNOSIS — Z87891 Personal history of nicotine dependence: Secondary | ICD-10-CM | POA: Diagnosis not present

## 2023-10-13 DIAGNOSIS — E119 Type 2 diabetes mellitus without complications: Secondary | ICD-10-CM

## 2023-10-13 DIAGNOSIS — N189 Chronic kidney disease, unspecified: Secondary | ICD-10-CM | POA: Diagnosis present

## 2023-10-13 DIAGNOSIS — Z940 Kidney transplant status: Secondary | ICD-10-CM | POA: Diagnosis not present

## 2023-10-13 DIAGNOSIS — I129 Hypertensive chronic kidney disease with stage 1 through stage 4 chronic kidney disease, or unspecified chronic kidney disease: Secondary | ICD-10-CM | POA: Diagnosis present

## 2023-10-13 DIAGNOSIS — G8314 Monoplegia of lower limb affecting left nondominant side: Secondary | ICD-10-CM | POA: Diagnosis present

## 2023-10-13 DIAGNOSIS — E042 Nontoxic multinodular goiter: Secondary | ICD-10-CM | POA: Diagnosis present

## 2023-10-13 DIAGNOSIS — D84821 Immunodeficiency due to drugs: Secondary | ICD-10-CM | POA: Diagnosis present

## 2023-10-13 DIAGNOSIS — E1165 Type 2 diabetes mellitus with hyperglycemia: Secondary | ICD-10-CM | POA: Diagnosis present

## 2023-10-13 DIAGNOSIS — T8612 Kidney transplant failure: Secondary | ICD-10-CM | POA: Diagnosis present

## 2023-10-13 DIAGNOSIS — Z720 Tobacco use: Secondary | ICD-10-CM | POA: Diagnosis not present

## 2023-10-13 DIAGNOSIS — E1122 Type 2 diabetes mellitus with diabetic chronic kidney disease: Secondary | ICD-10-CM

## 2023-10-13 DIAGNOSIS — Z79624 Long term (current) use of inhibitors of nucleotide synthesis: Secondary | ICD-10-CM

## 2023-10-13 DIAGNOSIS — K648 Other hemorrhoids: Secondary | ICD-10-CM | POA: Diagnosis present

## 2023-10-13 DIAGNOSIS — N269 Renal sclerosis, unspecified: Secondary | ICD-10-CM | POA: Diagnosis present

## 2023-10-13 DIAGNOSIS — Z91048 Other nonmedicinal substance allergy status: Secondary | ICD-10-CM

## 2023-10-13 DIAGNOSIS — Z8673 Personal history of transient ischemic attack (TIA), and cerebral infarction without residual deficits: Secondary | ICD-10-CM

## 2023-10-13 DIAGNOSIS — E782 Mixed hyperlipidemia: Secondary | ICD-10-CM

## 2023-10-13 DIAGNOSIS — F17210 Nicotine dependence, cigarettes, uncomplicated: Secondary | ICD-10-CM | POA: Diagnosis present

## 2023-10-13 DIAGNOSIS — Z8249 Family history of ischemic heart disease and other diseases of the circulatory system: Secondary | ICD-10-CM

## 2023-10-13 DIAGNOSIS — I151 Hypertension secondary to other renal disorders: Secondary | ICD-10-CM

## 2023-10-13 DIAGNOSIS — Z91148 Patient's other noncompliance with medication regimen for other reason: Secondary | ICD-10-CM

## 2023-10-13 DIAGNOSIS — E1151 Type 2 diabetes mellitus with diabetic peripheral angiopathy without gangrene: Secondary | ICD-10-CM | POA: Diagnosis not present

## 2023-10-13 DIAGNOSIS — N183 Chronic kidney disease, stage 3 unspecified: Secondary | ICD-10-CM

## 2023-10-13 DIAGNOSIS — Z888 Allergy status to other drugs, medicaments and biological substances status: Secondary | ICD-10-CM

## 2023-10-13 LAB — BASIC METABOLIC PANEL WITH GFR
Anion gap: 8 (ref 5–15)
BUN: 26 mg/dL — ABNORMAL HIGH (ref 6–20)
CO2: 23 mmol/L (ref 22–32)
Calcium: 9.1 mg/dL (ref 8.9–10.3)
Chloride: 106 mmol/L (ref 98–111)
Creatinine, Ser: 1.88 mg/dL — ABNORMAL HIGH (ref 0.44–1.00)
GFR, Estimated: 31 mL/min — ABNORMAL LOW (ref >60)
Glucose, Bld: 147 mg/dL — ABNORMAL HIGH (ref 70–99)
Potassium: 4 mmol/L (ref 3.5–5.1)
Sodium: 137 mmol/L (ref 135–145)

## 2023-10-13 LAB — CBC
HCT: 43.3 % (ref 36.0–46.0)
Hemoglobin: 13.9 g/dL (ref 12.0–15.0)
MCH: 29 pg (ref 26.0–34.0)
MCHC: 32.1 g/dL (ref 30.0–36.0)
MCV: 90.4 fL (ref 80.0–100.0)
Platelets: 205 10*3/uL (ref 150–400)
RBC: 4.79 MIL/uL (ref 3.87–5.11)
RDW: 13.3 % (ref 11.5–15.5)
WBC: 13 10*3/uL — ABNORMAL HIGH (ref 4.0–10.5)
nRBC: 0 % (ref 0.0–0.2)

## 2023-10-13 LAB — CBG MONITORING, ED: Glucose-Capillary: 113 mg/dL — ABNORMAL HIGH (ref 70–99)

## 2023-10-13 MED ORDER — BUPROPION HCL ER (XL) 150 MG PO TB24
150.0000 mg | ORAL_TABLET | Freq: Every day | ORAL | Status: DC
  Administered 2023-10-14: 150 mg via ORAL
  Filled 2023-10-13: qty 1

## 2023-10-13 MED ORDER — STROKE: EARLY STAGES OF RECOVERY BOOK
Freq: Once | Status: AC
  Filled 2023-10-13: qty 1

## 2023-10-13 MED ORDER — SODIUM CHLORIDE 0.9 % IV SOLN: INTRAVENOUS | Status: DC

## 2023-10-13 MED ORDER — ASPIRIN 81 MG PO TBEC
81.0000 mg | DELAYED_RELEASE_TABLET | Freq: Every day | ORAL | Status: DC
  Administered 2023-10-14 (×2): 81 mg via ORAL
  Filled 2023-10-13 (×2): qty 1

## 2023-10-13 MED ORDER — MYCOPHENOLATE MOFETIL 250 MG PO CAPS
500.0000 mg | ORAL_CAPSULE | Freq: Two times a day (BID) | ORAL | Status: DC
  Administered 2023-10-14 (×2): 500 mg via ORAL
  Filled 2023-10-13 (×2): qty 2

## 2023-10-13 MED ORDER — CALCITRIOL 0.25 MCG PO CAPS
0.2500 ug | ORAL_CAPSULE | Freq: Every day | ORAL | Status: DC
  Administered 2023-10-14: 0.25 ug via ORAL
  Filled 2023-10-13: qty 1

## 2023-10-13 MED ORDER — PREDNISONE 5 MG PO TABS
5.0000 mg | ORAL_TABLET | Freq: Every day | ORAL | Status: DC
  Administered 2023-10-14: 5 mg via ORAL
  Filled 2023-10-13: qty 1

## 2023-10-13 MED ORDER — ACETAMINOPHEN 325 MG PO TABS
650.0000 mg | ORAL_TABLET | ORAL | Status: DC | PRN
  Administered 2023-10-14: 650 mg via ORAL
  Filled 2023-10-13: qty 2

## 2023-10-13 MED ORDER — INSULIN ASPART 100 UNIT/ML IJ SOLN
0.0000 [IU] | INTRAMUSCULAR | Status: DC
  Administered 2023-10-14: 2 [IU] via SUBCUTANEOUS

## 2023-10-13 MED ORDER — INSULIN GLARGINE-YFGN 100 UNIT/ML ~~LOC~~ SOLN
5.0000 [IU] | Freq: Every day | SUBCUTANEOUS | Status: DC
  Administered 2023-10-14: 5 [IU] via SUBCUTANEOUS
  Filled 2023-10-13 (×2): qty 0.05

## 2023-10-13 MED ORDER — IPRATROPIUM BROMIDE 0.02 % IN SOLN
0.5000 mg | Freq: Two times a day (BID) | RESPIRATORY_TRACT | Status: DC | PRN
Start: 2023-10-13 — End: 2023-10-14

## 2023-10-13 MED ORDER — ACETAMINOPHEN 650 MG RE SUPP: 650.0000 mg | RECTAL | Status: DC | PRN

## 2023-10-13 MED ORDER — ACETAMINOPHEN 160 MG/5ML PO SOLN: 650.0000 mg | ORAL | Status: DC | PRN

## 2023-10-13 MED ORDER — ROSUVASTATIN CALCIUM 20 MG PO TABS
40.0000 mg | ORAL_TABLET | Freq: Every day | ORAL | Status: DC
  Administered 2023-10-14: 40 mg via ORAL
  Filled 2023-10-13: qty 2

## 2023-10-13 MED ORDER — CLOPIDOGREL BISULFATE 75 MG PO TABS
75.0000 mg | ORAL_TABLET | Freq: Every day | ORAL | Status: DC
  Administered 2023-10-14: 75 mg via ORAL
  Filled 2023-10-13: qty 1

## 2023-10-13 MED ORDER — SODIUM CHLORIDE 0.9 % IV BOLUS
1000.0000 mL | Freq: Once | INTRAVENOUS | Status: AC
  Administered 2023-10-13: 1000 mL via INTRAVENOUS

## 2023-10-13 MED ORDER — IOHEXOL 350 MG/ML SOLN
60.0000 mL | Freq: Once | INTRAVENOUS | Status: AC | PRN
  Administered 2023-10-13: 60 mL via INTRAVENOUS

## 2023-10-13 MED ORDER — CLOPIDOGREL BISULFATE 75 MG PO TABS: 75.0000 mg | ORAL_TABLET | Freq: Every day | ORAL | Status: DC

## 2023-10-13 MED ORDER — SODIUM BICARBONATE 650 MG PO TABS
1300.0000 mg | ORAL_TABLET | Freq: Three times a day (TID) | ORAL | Status: DC
  Administered 2023-10-14 (×3): 1300 mg via ORAL
  Filled 2023-10-13 (×3): qty 2

## 2023-10-13 MED ORDER — TACROLIMUS 1 MG PO CAPS
1.0000 mg | ORAL_CAPSULE | Freq: Two times a day (BID) | ORAL | Status: DC
  Administered 2023-10-14 (×2): 1 mg via ORAL
  Filled 2023-10-13 (×2): qty 1

## 2023-10-13 NOTE — Assessment & Plan Note (Signed)
 Will continue

## 2023-10-13 NOTE — ED Notes (Signed)
 Attempted to call report x1 no answer patient transferred to floor at this time

## 2023-10-13 NOTE — ED Triage Notes (Signed)
 Pt bib ems for left foot numbness and weakness x 24 hours. No other neuro symptoms. Hx of TIA. Pt takes plavix 

## 2023-10-13 NOTE — ED Provider Triage Note (Signed)
 Emergency Medicine Provider Triage Evaluation Note  Isabella Obrien , a 58 y.o. female  was evaluated in triage.  Pt complains of left foot/ankle/lower leg numbness/weakness feeling in past day.  No arm numbness/weakness. No radicular pain. No change in speech or vision.   Review of Systems  Positive: Numbness/weakness Negative: Pain, visual change, speech change.   Physical Exam  BP 136/84 (BP Location: Left Arm)   Pulse 83   Temp 98.2 F (36.8 C)   Resp 17   Ht 1.588 m (5' 2.5")   Wt 59 kg   LMP 10/24/2012   SpO2 99%   BMI 23.40 kg/m  Gen:   Awake, no distress Resp:  Normal effort  MSK:   Moves extremities without difficulty. Left foot distal pulse intact. Foot is of normal color and warmth. Normal cap refill in toes. No focal swelling or bony tenderness.  Neuro: LLE nvi, with grossly intact motor/sens fxn.   Medical Decision Making  Medically screening exam initiated at 12:55 PM.  Appropriate orders placed.  Isabella Obrien was informed that the remainder of the evaluation will be completed by another provider, this initial triage assessment does not replace that evaluation, and the importance of remaining in the ED until their evaluation is complete.     Guadalupe Lee, MD 10/13/23 1256

## 2023-10-13 NOTE — ED Notes (Signed)
 Patient transported to X-ray

## 2023-10-13 NOTE — ED Provider Notes (Signed)
 [place] EMERGENCY DEPARTMENT AT [facility] Provider Note   CSN: [id number] Arrival date & time: 10/13/23  1201     History  Chief Complaint  Patient presents with   Numbness    Isabella Obrien is a 58 y.o. female.  58 yo F with a chief complaints of left leg weakness.  This started yesterday but thought it was much worse today.  She fell couple times due to that leg being weak.  She also was having some trouble with finding the right words to say and had trouble figuring out how to put in the pin for her phone.        Home Medications Prior to Admission medications   Medication Sig Start Date End Date Taking? Authorizing Provider  albuterol  (PROVENTIL ) (2.5 MG/3ML) 0.083% nebulizer solution Take 3 mLs (2.5 mg total) by nebulization 2 (two) times daily as needed for wheezing or shortness of breath. 01/29/21  Yes Luevenia Saha, MD  albuterol  (VENTOLIN  HFA) 108 [phone] Base) MCG/ACT inhaler Inhale 2 puffs into the lungs every 6 (six) hours as needed for wheezing or shortness of breath. 11/03/20  Yes Luevenia Saha, MD  amLODipine (NORVASC) 5 MG tablet Take 5 mg by mouth daily. 08/02/23  Yes [provider]  atenolol (TENORMIN) 100 MG tablet Take 100 mg by mouth at bedtime.   Yes [provider]  benazepril (LOTENSIN) 40 MG tablet Take 40 mg by mouth in the morning and at bedtime.   Yes [provider]  buPROPion  (WELLBUTRIN  XL) 150 MG 24 hr tablet Take 1 tablet (150 mg total) by mouth daily. 08/10/23  Yes Dohmeier, Raoul Byes, MD  calcitRIOL (ROCALTROL) 0.25 MCG capsule Take 0.25 mcg by mouth daily. 10/03/23  Yes [provider]  clopidogrel  (PLAVIX ) 75 MG tablet Take 75 mg by mouth daily.   Yes [provider]  escitalopram  (LEXAPRO ) 10 MG tablet Take 10 mg by mouth at bedtime.   Yes [provider]  FARXIGA  10 MG TABS tablet Take 10 mg by mouth in the morning.   Yes [provider]  furosemide  (LASIX ) 40 MG tablet  Take 1 tablet (40 mg total) by mouth daily as needed. Patient taking differently: Take 40 mg by mouth daily as needed for edema or fluid. 06/24/21  Yes Luevenia Saha, MD  glipiZIDE (GLUCOTROL XL) 10 MG 24 hr tablet Take 10 mg by mouth daily. 08/24/23  Yes [provider]  KERENDIA  20 MG TABS Take 20 mg by mouth in the morning.   Yes [provider]  mycophenolate  (CELLCEPT ) 500 MG tablet Take 500 mg by mouth in the morning and at bedtime.   Yes [provider]  Omega-3 Fatty Acids (FISH OIL) 1000 MG CAPS Take 1,000 mg by mouth in the morning and at bedtime. 02/13/14  Yes [provider]  OZEMPIC, 2 MG/DOSE, 8 MG/3ML SOPN Inject 2 mg into the skin once a week. 09/20/23  Yes [provider]  predniSONE  (DELTASONE ) 5 MG tablet Take 5 mg by mouth daily with breakfast.   Yes [provider]  rosuvastatin  (CRESTOR ) 40 MG tablet Take 1 tablet (40 mg total) by mouth at bedtime. 01/13/23 10/13/23 Yes Lesa Rape, MD  sodium bicarbonate  650 MG tablet Take 1,300 mg by mouth 3 (three) times daily.   Yes [provider]  tacrolimus  (PROGRAF ) 1 MG capsule Take 1 mg by mouth in the morning and at bedtime.   Yes [provider]  [facility]  FLEXTOUCH 100 UNIT/ML FlexTouch Pen Inject 13 Units into the skin at bedtime.   Yes [provider]  zolpidem  (AMBIEN ) 10 MG tablet Take 10 mg by mouth at bedtime. 12/06/21  Yes [provider]  ipratropium (ATROVENT ) 0.02 % nebulizer solution Take 2.5 mLs (0.5 mg total) by nebulization 2 (two) times daily as needed for wheezing or shortness of breath. 01/29/21   Luevenia Saha, MD      Allergies    Cefuroxime, Benadryl [diphenhydramine hcl], Tape, and Wound dressing adhesive    Review of Systems   Review of Systems  Physical Exam Updated Vital Signs BP (!) 145/93 (BP Location: Left Arm)   Pulse 91   Temp 98.4 F (36.9 C) (Oral)   Resp 18   Ht 5' 2.5" (1.588 m)   Wt 59 kg   LMP 10/24/2012    SpO2 100%   BMI 23.40 kg/m  Physical Exam Vitals and nursing note reviewed.  Constitutional:      General: She is not in acute distress.    Appearance: She is well-developed. She is not diaphoretic.  HENT:     Head: Normocephalic and atraumatic.  Eyes:     Pupils: Pupils are equal, round, and reactive to light.  Cardiovascular:     Rate and Rhythm: Normal rate and regular rhythm.     Heart sounds: No murmur heard.    No friction rub. No gallop.  Pulmonary:     Effort: Pulmonary effort is normal.     Breath sounds: No wheezing or rales.  Abdominal:     General: There is no distension.     Palpations: Abdomen is soft.     Tenderness: There is no abdominal tenderness.  Musculoskeletal:        General: No tenderness.     Cervical back: Normal range of motion and neck supple.  Skin:    General: Skin is warm and dry.  Neurological:     Mental Status: She is alert and oriented to person, place, and time.     Cranial Nerves: Cranial nerves 2-12 are intact.     Sensory: Sensation is intact.     Coordination: Coordination is intact.     Comments: 4 out of 5 muscle strength to the left lower extremity compared to right 5 out of 5.  Psychiatric:        Behavior: Behavior normal.     ED Results / Procedures / Treatments   Labs (all labs ordered are listed, but only abnormal results are displayed) Labs Reviewed  CBC - Abnormal; Notable for the following components:      Result Value   WBC 13.0 (*)    All other components within normal limits  BASIC METABOLIC PANEL WITH GFR - Abnormal; Notable for the following components:   Glucose, Bld 147 (*)    BUN 26 (*)    Creatinine, Ser 1.88 (*)    GFR, Estimated 31 (*)    All other components within normal limits  CBG MONITORING, ED - Abnormal; Notable for the following components:   Glucose-Capillary 113 (*)    All other components within normal limits  CK  MAGNESIUM  PHOSPHORUS  HEPATIC FUNCTION PANEL  TSH  URINALYSIS,  COMPLETE (UACMP) WITH MICROSCOPIC  HEMOGLOBIN A1C    EKG None  Radiology DG Chest 2 View Result Date: 10/13/2023 CLINICAL DATA:  Left foot numbness and weakness. EXAM: CHEST - 2 VIEW COMPARISON:  Sep 22, 2023 FINDINGS: The heart size and mediastinal  contours are within normal limits. Both lungs are clear. There is mild, stable levoscoliosis of the lower thoracic spine and upper lumbar spine. The visualized skeletal structures are unremarkable. IMPRESSION: No active cardiopulmonary disease. Electronically Signed   By: Virgle Grime M.D.   On: 10/13/2023 21:12   MR BRAIN WO CONTRAST Result Date: 10/13/2023 CLINICAL DATA:  Neuro deficit, acute, stroke suspected. Left lower extremity numbness/weakness. EXAM: MRI HEAD WITHOUT CONTRAST TECHNIQUE: Multiplanar, multiecho pulse sequences of the brain and surrounding structures were obtained without intravenous contrast. COMPARISON:  Head CT and CTA 10/13/2023 and MRI 01/11/2023 FINDINGS: Brain: There is a 3 mm focus of mild diffusion weighted signal abnormality in the inferior right thalamus suspected to reflect an acute infarct (series 2, image 22 and series 3, image 16). A chronic infarct is noted in the left corona radiata. Scattered small T2 hyperintensities elsewhere in the cerebral white matter bilaterally are similar to the prior MRI and are nonspecific but compatible with mild chronic small vessel ischemic disease. Cerebral volume is within normal limits for age. The ventricles are normal in size. No intracranial hemorrhage, mass, midline shift, or extra-axial fluid collection is identified. Vascular: Major intracranial vascular flow voids are preserved. Skull and upper cervical spine: Unremarkable bone marrow signal. Sinuses/Orbits: Unremarkable orbits. Paranasal sinuses and mastoid air cells are clear. Other: None. IMPRESSION: 1. Suspected acute lacunar infarct in the right thalamus. 2. Mild chronic small vessel ischemic disease. Electronically  Signed   By: Aundra Lee M.D.   On: 10/13/2023 19:28   CT ANGIO HEAD NECK W WO CM Result Date: 10/13/2023 CLINICAL DATA:  Neuro deficit, concern for stroke, left foot numbness and weakness for 24 hours. EXAM: CT ANGIOGRAPHY HEAD AND NECK WITH AND WITHOUT CONTRAST TECHNIQUE: Multidetector CT imaging of the head and neck was performed using the standard protocol during bolus administration of intravenous contrast. Multiplanar CT image reconstructions and MIPs were obtained to evaluate the vascular anatomy. Carotid stenosis measurements (when applicable) are obtained utilizing NASCET criteria, using the distal internal carotid diameter as the denominator. RADIATION DOSE REDUCTION: This exam was performed according to the departmental dose-optimization program which includes automated exposure control, adjustment of the mA and/or kV according to patient size and/or use of iterative reconstruction technique. CONTRAST:  60mL OMNIPAQUE IOHEXOL 350 MG/ML SOLN COMPARISON:  Same-day head CT.  MRA head 01/12/2023. FINDINGS: CTA NECK FINDINGS Aortic arch: Standard configuration of the aortic arch. Imaged portion shows no evidence of aneurysm or dissection. No significant stenosis of the major arch vessel origins. Pulmonary arteries: As permitted by contrast timing, there are no filling defects in the visualized pulmonary arteries. Subclavian arteries: The subclavian arteries are patent bilaterally. Right carotid system: No evidence of dissection, stenosis (50% or greater), or occlusion. Mild atherosclerosis along the proximal cervical ICA without stenosis. Left carotid system: No evidence of dissection, stenosis (50% or greater), or occlusion. Mild atherosclerosis at the carotid bifurcation without significant stenosis. Vertebral arteries: Codominant. No evidence of dissection, stenosis (50% or greater), or occlusion. Skeleton: No acute findings. Degenerative changes in the cervical spine. Disc space narrowing most  pronounced at C5-6 and C6-7. Reversal of the normal cervical lordosis. Other neck: The visualized airway is patent. No cervical lymphadenopathy. Multiple thyroid nodules the largest measuring up to 1.8 cm. Upper chest: Visualized lung apices are clear. Review of the MIP images confirms the above findings CTA HEAD FINDINGS ANTERIOR CIRCULATION: The intracranial ICAs are patent bilaterally. Mild atherosclerosis of the right cavernous ICA. No significant stenosis, proximal occlusion,  aneurysm, or vascular malformation. MCAs: The middle cerebral arteries are patent bilaterally. ACAs: Patent bilaterally. Diminutive left A1 segment, likely congenital. POSTERIOR CIRCULATION: No significant stenosis, proximal occlusion, aneurysm, or vascular malformation. PCAs: The posterior cerebral arteries are patent bilaterally. Pcomm: Not well visualized. SCAs: The superior cerebellar arteries are patent bilaterally. Basilar artery: Patent AICAs: Patent PICAs: Patent Vertebral arteries: The intracranial vertebral arteries are patent. Venous sinuses: As permitted by contrast timing, patent. Anatomic variants: None Review of the MIP images confirms the above findings IMPRESSION: No large vessel occlusion. No high-grade stenosis, aneurysm, or dissection of the arteries in the head and neck. Mild scattered atherosclerosis as above. Multiple thyroid nodules measuring up to 1.8 cm. Recommend nonemergent correlation with thyroid ultrasound. Electronically Signed   By: Denny Flack M.D.   On: 10/13/2023 18:59   CT Head Wo Contrast Result Date: 10/13/2023 CLINICAL DATA:  Neuro deficit, acute, stroke suspected. Left foot/lower leg numbness/weakness. EXAM: CT HEAD WITHOUT CONTRAST TECHNIQUE: Contiguous axial images were obtained from the base of the skull through the vertex without intravenous contrast. RADIATION DOSE REDUCTION: This exam was performed according to the departmental dose-optimization program which includes automated exposure  control, adjustment of the mA and/or kV according to patient size and/or use of iterative reconstruction technique. COMPARISON:  Head MRI 01/11/2023 FINDINGS: Brain: There is no evidence of an acute infarct, intracranial hemorrhage, mass, midline shift, or extra-axial fluid collection. Cerebral volume is normal. The ventricles are normal in size. A chronic infarct in the left corona radiata was acute on the prior MRI. Vascular: Calcified atherosclerosis at the skull base. No hyperdense vessel. Skull: No fracture or suspicious lesion. Sinuses/Orbits: Visualized paranasal sinuses and mastoid air cells are clear. Unremarkable orbits. Other: None. IMPRESSION: 1. No evidence of acute intracranial abnormality. 2. Chronic left corona radiata infarct. Electronically Signed   By: Aundra Lee M.D.   On: 10/13/2023 15:21    Procedures Procedures    Medications Ordered in ED Medications  insulin  aspart (novoLOG ) injection 0-9 Units ( Subcutaneous Not Given 10/13/23 2111)  sodium chloride  0.9 % bolus 1,000 mL (0 mLs Intravenous Stopped 10/13/23 1947)  iohexol (OMNIPAQUE) 350 MG/ML injection 60 mL (60 mLs Intravenous Contrast Given 10/13/23 1703)    ED Course/ Medical Decision Making/ A&P                                 Medical Decision Making Amount and/or Complexity of Data Reviewed Radiology: ordered.  Risk Prescription drug management. Decision regarding hospitalization.   58 yo F with a chief complaints of left-sided weakness.  Noticed yesterday.  Has gotten worse today.  Also had some trouble with word finding and some confusion.  History of a stroke about 9 months ago.  Had different symptoms at that time.  CT of the head without obvious acute intracranial hemorrhage on my independent interpretation.  I discussed the case with neurology, Dr. Cleone Dad, recommends CT angiogram head and neck and MRI of the brain.   CT angiogram head and neck without obvious acute finding.  MRI of the brain with  concern for an acute lacunar infarct.  Neurology to see.  Will discuss with medicine for admission.   The patients results and plan were reviewed and discussed.   Any x-rays performed were independently reviewed by myself.   Differential diagnosis were considered with the presenting HPI.  Medications  insulin  aspart (novoLOG ) injection 0-9 Units ( Subcutaneous Not Given 10/13/23  2111)  sodium chloride  0.9 % bolus 1,000 mL (0 mLs Intravenous Stopped 10/13/23 1947)  iohexol (OMNIPAQUE) 350 MG/ML injection 60 mL (60 mLs Intravenous Contrast Given 10/13/23 1703)    Vitals:   10/13/23 1202 10/13/23 1207 10/13/23 1210 10/13/23 1719  BP:   136/84 (!) 145/93  Pulse:   83 91  Resp:   17 18  Temp:   98.2 F (36.8 C) 98.4 F (36.9 C)  TempSrc:    Oral  SpO2: 98%  99% 100%  Weight:  59 kg    Height:  5' 2.5" (1.588 m)      Final diagnoses:  Acute ischemic stroke [facility])    Admission/ observation were discussed with the admitting physician, patient and/or family and they are comfortable with the plan.         Final Clinical Impression(s) / ED Diagnoses Final diagnoses:  Acute ischemic stroke [facility])    Rx / DC Orders ED Discharge Orders     None         Albertus Hughs, DO 10/13/23 2246

## 2023-10-13 NOTE — Subjective & Objective (Signed)
 24 hours of left foot numbness and weakness otherwise no other neurological complaints patient has history of TIA in the past and takes Plavix  Started yesterday but worsened today she have had a few falls secondary to leg being weak Also difficulty finding words or difficulty operating her phone Initial CT head nonacute Neurology consulted who recommended CTA head and neck and MRI of the brain

## 2023-10-13 NOTE — ED Notes (Signed)
 Snack bag and drink provided as requested

## 2023-10-13 NOTE — Consult Note (Signed)
 NEUROLOGY CONSULT NOTE   Date of service: Oct 13, 2023 Patient Name: Isabella Obrien MRN:  [id number] DOB:  September 19, 1965 Chief Complaint: "Left sided numbness" Requesting Provider: Selene Dais, MD  History of Present Illness  Isabella Obrien is a 58 y.o. female with hx of diabetes, hypertension, hyperlipidemia, CKD status post kidney transplant, tobacco use who presented with a chief complaint of left leg weakness and numbness.  Symptoms started sometime around noon on 10/12/2023.  She had an MRI of the brain without contrast which demonstrated a right thalamic stroke.  She had CT angio of the head and neck which was negative for any large vessel occlusion or significant stenosis.  Neurology was consulted further evaluation and workup of the noted stroke.  Patient endorses prior history of TIA back in August 2024.  She endorses current everyday smoking but is very willing to quit.  She reports her last A1c was above 7 but she is not entirely sure what the actual number was.  Patient endorses that she ran out of her medication and has not been taking her medication for the last week or 2.  LKW: Noon on 10/12/2023 Modified rankin score: 0-Completely asymptomatic and back to baseline post- stroke IV Thrombolysis: Not offered, she is outside the window.   EVT: Not offered, she is outside of window.  Low suspicion for LVO.    NIHSS components Score: Comment  1a Level of Conscious 0[]  1[]  2[]  3[]      1b LOC Questions 0[]  1[]  2[]       1c LOC Commands 0[]  1[]  2[]       2 Best Gaze 0[]  1[]  2[]       3 Visual 0[]  1[]  2[]  3[]      4 Facial Palsy 0[]  1[]  2[]  3[]      5a Motor Arm - left 0[]  1[]  2[]  3[]  4[]  UN[]    5b Motor Arm - Right 0[]  1[]  2[]  3[]  4[]  UN[]    6a Motor Leg - Left 0[]  1[]  2[]  3[]  4[]  UN[]    6b Motor Leg - Right 0[]  1[]  2[]  3[]  4[]  UN[]    7 Limb Ataxia 0[]  1[]  2[]  UN[]      8 Sensory 0[x]  1[]  2[]  UN[]      9 Best Language 0[]  1[]  2[]  3[]      10 Dysarthria 0[]  1[]  2[]  UN[]       11 Extinct. and Inattention 0[]  1[]  2[]       TOTAL: 1      ROS  Comprehensive ROS performed and pertinent positives documented in HPI   Past History   Past Medical History:  Diagnosis Date   Chronic kidney disease    has kidney transplant - having aphoresis now every other week   Diabetes mellitus without complication (HCC)    dm type 2 - fasting 150   History of blood product transfusion    Hyperlipidemia    Hypertension    Menopausal hot flushes 09/06/2017   Controlled on Lexapro  by Dr. Asencion Blacksmith   Neuromuscular disorder [facility])     Past Surgical History:  Procedure Laterality Date   COLONOSCOPY     diateck catheter placement  09/2013   KIDNEY TRANSPLANT     ORIF ANKLE FRACTURE Left 2014   TEE WITHOUT CARDIOVERSION N/A 01/13/2023   Procedure: TRANSESOPHAGEAL ECHOCARDIOGRAM;  Surgeon: Jann Melody, MD;  Location: [facility];  Service: Cardiovascular;  Laterality: N/A;    Family History: Family History  Problem Relation Age of Onset   Hypertension Mother  Other Father 16       amyloidosis   Healthy Brother    Stroke Paternal Grandmother     Social History  reports that she has been smoking cigarettes. She started smoking about 37 years ago. She has a 0.7 pack-year smoking history. She has never used smokeless tobacco. She reports current alcohol use of about 1.0 standard drink of alcohol per week. She reports that she does not use drugs.  Allergies  Allergen Reactions   Cefuroxime Swelling and Other (See Comments)    Ceftin   Benadryl [Diphenhydramine Hcl] Swelling, Anxiety and Other (See Comments)    Hyperactivity and angioedema, also   Tape Rash and Other (See Comments)    Paper tape is okay   Wound Dressing Adhesive Rash and Other (See Comments)    Paper tape is ok    Medications   Current Facility-Administered Medications:    insulin  aspart (novoLOG ) injection 0-9 Units, 0-9 Units, Subcutaneous, Q4H, Doutova, Anastassia, MD  Current  Outpatient Medications:    Accu-Chek FastClix Lancets MISC, USE AS DIRECTED UP TO FOUR TIMES DAILY, Disp: 102 each, Rfl: 0   ACCU-CHEK GUIDE test strip, USE TO TEST AS DIRECTED UP TO 4 TIMES DAILY, Disp: 100 strip, Rfl: 2   albuterol  (PROVENTIL ) (2.5 MG/3ML) 0.083% nebulizer solution, Take 3 mLs (2.5 mg total) by nebulization 2 (two) times daily as needed for wheezing or shortness of breath., Disp: 150 mL, Rfl: 1   albuterol  (VENTOLIN  HFA) 108 (90 Base) MCG/ACT inhaler, Inhale 2 puffs into the lungs every 6 (six) hours as needed for wheezing or shortness of breath., Disp: 1 each, Rfl: 5   atenolol (TENORMIN) 100 MG tablet, Take 100 mg by mouth at bedtime., Disp: , Rfl:    benazepril (LOTENSIN) 40 MG tablet, Take 40 mg by mouth in the morning and at bedtime., Disp: , Rfl:    blood glucose meter kit and supplies KIT, Dispense based on patient and insurance preference. Use up to four times daily as directed., Disp: 1 each, Rfl: 0   buPROPion  (WELLBUTRIN  XL) 150 MG 24 hr tablet, Take 1 tablet (150 mg total) by mouth daily., Disp: 30 tablet, Rfl: 3   clopidogrel  (PLAVIX ) 75 MG tablet, Take 75 mg by mouth daily., Disp: , Rfl:    escitalopram  (LEXAPRO ) 10 MG tablet, Take 10 mg by mouth at bedtime., Disp: , Rfl:    FARXIGA  10 MG TABS tablet, Take 10 mg by mouth in the morning., Disp: , Rfl:    furosemide  (LASIX ) 40 MG tablet, Take 1 tablet (40 mg total) by mouth daily as needed. (Patient taking differently: Take 40 mg by mouth daily as needed for edema or fluid.), Disp: 30 tablet, Rfl: 3   insulin  glargine (LANTUS  SOLOSTAR) 100 UNIT/ML Solostar Pen, Inject 100 Units into the skin daily., Disp: , Rfl:    Insulin  Pen Needle 32G X 4 MM MISC, 50 each by Does not apply route in the morning and at bedtime., Disp: 50 each, Rfl: 3   ipratropium (ATROVENT ) 0.02 % nebulizer solution, Take 2.5 mLs (0.5 mg total) by nebulization 2 (two) times daily as needed for wheezing or shortness of breath., Disp: 75 mL, Rfl: 12    KERENDIA  20 MG TABS, Take 20 mg by mouth in the morning., Disp: , Rfl:    mycophenolate  (CELLCEPT ) 500 MG tablet, Take 500 mg by mouth in the morning and at bedtime., Disp: , Rfl:    nicotine (NICODERM CQ - DOSED IN MG/24 HOURS) 21  mg/24hr patch, Place 21 mg onto the skin daily., Disp: , Rfl:    Omega-3 Fatty Acids (FISH OIL) 1000 MG CAPS, Take 1,000 mg by mouth in the morning and at bedtime., Disp: , Rfl:    OZEMPIC, 1 MG/DOSE, 4 MG/3ML SOPN, Inject 1 mg into the skin every Wednesday., Disp: , Rfl:    predniSONE  (DELTASONE ) 5 MG tablet, Take 5 mg by mouth daily with breakfast., Disp: , Rfl:    rosuvastatin  (CRESTOR ) 20 MG tablet, Take 20 mg by mouth daily. (Patient not taking: Reported on 08/10/2023), Disp: , Rfl:    rosuvastatin  (CRESTOR ) 40 MG tablet, Take 1 tablet (40 mg total) by mouth at bedtime., Disp: 30 tablet, Rfl: 0   sodium bicarbonate  650 MG tablet, Take 1,300 mg by mouth 3 (three) times daily., Disp: , Rfl:    tacrolimus  (PROGRAF ) 1 MG capsule, Take 1 mg by mouth in the morning and at bedtime., Disp: , Rfl:    Tetrahydroz-Dextran-PEG-Povid (VISINE ADVANCED RELIEF OP), Place 1 drop into both eyes 3 (three) times daily as needed (for dryness)., Disp: , Rfl:    TRESIBA FLEXTOUCH 100 UNIT/ML FlexTouch Pen, Inject 13 Units into the skin at bedtime., Disp: , Rfl:    zolpidem  (AMBIEN ) 10 MG tablet, Take 10 mg by mouth at bedtime as needed for sleep., Disp: , Rfl:   Vitals   Vitals:   10/13/23 1202 10/13/23 1207 10/13/23 1210 10/13/23 1719  BP:   136/84 (!) 145/93  Pulse:   83 91  Resp:   17 18  Temp:   98.2 F (36.8 C) 98.4 F (36.9 C)  TempSrc:    Oral  SpO2: 98%  99% 100%  Weight:  59 kg    Height:  5' 2.5" (1.588 m)      Body mass index is 23.4 kg/m.  Physical Exam   General: Laying comfortably in bed; in no acute distress.  HENT: Normal oropharynx and mucosa. Normal external appearance of ears and nose.  Neck: Supple, no pain or tenderness  CV: No JVD. No peripheral  edema.  Pulmonary: Symmetric Chest rise. Normal respiratory effort.  Abdomen: Soft to touch, non-tender.  Ext: No cyanosis, edema, or deformity  Skin: No rash. Normal palpation of skin.   Musculoskeletal: Normal digits and nails by inspection. No clubbing.   Neurologic Examination  Mental status/Cognition: Alert, oriented to self, place, month and year, good attention.  Speech/language: Fluent, comprehension intact, object naming intact, repetition intact.  Cranial nerves:   CN II Pupils equal and reactive to light, no VF deficits    CN III,IV,VI EOM intact, no gaze preference or deviation, no nystagmus    CN V normal sensation in V1, V2, and V3 segments bilaterally    CN VII no asymmetry, no nasolabial fold flattening    CN VIII normal hearing to speech    CN IX & X normal palatal elevation, no uvular deviation    CN XI 5/5 head turn and 5/5 shoulder shrug bilaterally    CN XII midline tongue protrusion    Motor:  Muscle bulk: Normal, tone normal, pronator drift none tremor none Mvmt Root Nerve  Muscle Right Left Comments  SA C5/6 Ax Deltoid 5 5   EF C5/6 Mc Biceps 5 5   EE C6/7/8 Rad Triceps 5 5   WF C6/7 Med FCR     WE C7/8 PIN ECU     F Ab C8/T1 U ADM/FDI 5 5   HF L1/2/3 Fem Illopsoas 5 5  KE L2/3/4 Fem Quad 5 5   DF L4/5 D Peron Tib Ant 5 3   PF S1/2 Tibial Grc/Sol 5 5    Sensation:  Light touch Slightly decreased in left foot.   Pin prick    Temperature    Vibration   Proprioception    Coordination/Complex Motor:  - Finger to Nose intact bilaterally - Heel to shin and bilaterally - Rapid alternating movement are normal - Gait: Deferred gait for patient safety.  Labs/Imaging/Neurodiagnostic studies   CBC:  Recent Labs  Lab 2023-10-17 1255  WBC 13.0*  HGB 13.9  HCT 43.3  MCV 90.4  PLT 205   Basic Metabolic Panel:  Lab Results  Component Value Date   NA 137 Oct 17, 2023   K 4.0 Oct 17, 2023   CO2 23 10/17/2023   GLUCOSE 147 (H) 10-17-23   BUN 26 (H)  2023/10/17   CREATININE 1.88 (H) 10-17-2023   CALCIUM  9.1 10-17-23   GFRNONAA 31 (L) 10/17/2023   GFRAA 56 (L) 01/25/2020   Lipid Panel:  Lab Results  Component Value Date   LDLCALC 29 01/12/2023   HgbA1c:  Lab Results  Component Value Date   HGBA1C 7.5 (H) 01/11/2023   Urine Drug Screen:     Component Value Date/Time   LABOPIA NONE DETECTED 01/11/2023 1352   COCAINSCRNUR NONE DETECTED 01/11/2023 1352   LABBENZ NONE DETECTED 01/11/2023 1352   AMPHETMU NONE DETECTED 01/11/2023 1352   THCU POSITIVE (A) 01/11/2023 1352   LABBARB NONE DETECTED 01/11/2023 1352    Alcohol Level     Component Value Date/Time   ETH <10 01/11/2023 1000   INR  Lab Results  Component Value Date   INR 0.9 01/11/2023   APTT  Lab Results  Component Value Date   APTT 27 01/11/2023   AED levels: No results found for: "PHENYTOIN", "ZONISAMIDE", "LAMOTRIGINE", "LEVETIRACETA"  CT Head without contrast(Personally reviewed): CTH was negative for a large hypodensity concerning for a large territory infarct or hyperdensity concerning for an ICH  CT angio Head and Neck with contrast(Personally reviewed): No LVO.  No significant stenosis.  MRI Brain(Personally reviewed): Small acute right thalamic stroke.  ASSESSMENT   Isabella Obrien is a 58 y.o. female with hx of diabetes, hypertension, hyperlipidemia, CKD status post kidney transplant, tobacco use who presented with a chief complaint of left leg weakness.  She was found to have a small acute right thalamic stroke.  Stroke risk factors include diabetes, hypertension, hyperlipidemia, current everyday smoking.  Suspect etiology of stroke is likely small vessel disease.  RECOMMENDATIONS  - Frequent Neuro checks per stroke unit protocol - Recommend obtaining TTE  - Recommend obtaining Lipid panel with LDL - Please start statin if LDL > 70 - Recommend HbA1c to evaluate for diabetes and how well it is controlled. - Antithrombotic -aspirin  81 mg  daily along with Plavix  75 mg daily for 21 days, followed by aspirin  81 mg daily alone. - Recommend DVT ppx - SBP goal - permissive hypertension first 24 h < 220/110. Held home meds.  - Recommend Telemetry monitoring for arrythmia - Recommend bedside swallow screen prior to PO intake. - Stroke education booklet - Recommend PT/OT/SLP consult  ______________________________________________________________________    Signed, Leticia Mcdiarmid, MD [facility]

## 2023-10-13 NOTE — H&P (Signed)
 Isabella Obrien:[id number] DOB: [birth year] DOA: 10/13/2023     PCP: Dorena Gander, MD   Outpatient Specialists:  CARDS:   Dr. Bridgette Campus, MD  NEphrology:   Dr. Verlena Glenn, MD Dr. Christianne Cowper NEurology   Dr. Albertina Hugger    GI    Andriette Keeling, Susana Enter, MD    Patient arrived to ER on 10/13/23 at 1201 Referred by Attending Albertus Hughs, DO   Patient coming from:    home Lives  With family      Chief Complaint:   Chief Complaint  Patient presents with   Numbness    HPI: Isabella Obrien is a 58 y.o. female with medical history significant of CVA on Plavix  CKD due Glomerulosclerosis, DM2, HLD, HTN, OSA, tobacco abuse, stroke, HX OF Kidney transplant 58 yo ago, prednisone , prograph and cellcept     Presented with   Left foot weakness and numbness 24 hours of left foot numbness and weakness otherwise no other neurological complaints patient has history of TIA in the past and takes Plavix  Started yesterday but worsened today she have had a few falls secondary to leg being weak Also difficulty finding words or difficulty operating her phone Initial CT head nonacute Neurology consulted who recommended CTA head and neck and MRI of the brain     Denies significant ETOH intake   Does   smoke quit 2-3 days ago      Regarding pertinent Chronic problems:    Hyperlipidemia -  on statins  crestor  Lipid Panel     Component Value Date/Time   CHOL 112 01/12/2023 0734   TRIG 266 (H) 01/12/2023 0734   HDL 30 (L) 01/12/2023 0734   CHOLHDL 3.7 01/12/2023 0734   VLDL 53 (H) 01/12/2023 0734   LDLCALC 29 01/12/2023 0734   LDLCALC 57 01/25/2020 1137   LDLDIRECT 45.0 06/24/2021 1556     HTN on amlodipine, atenolol, benazepril, lasix  prn  last echo  Recent Results (from the past 95621 hours)  ECHO TEE   Collection Time: 01/13/23 11:12 AM  Result Value   Est EF 65 - 70%   Narrative      TRANSESOPHOGEAL ECHO REPORT        IMPRESSIONS    1. Left ventricular ejection fraction, by  estimation, is 65 to 70%. The left ventricle has normal function. There is severe concentric left ventricular hypertrophy.  2. Right ventricular systolic function is normal. The right ventricular size is normal. Mildly increased right ventricular wall thickness.  3. Appendage assessment imaging did not save to study images. No left atrial/left atrial appendage thrombus was detected.  4. A small pericardial effusion is present. The pericardial effusion is circumferential.  5. Billowing without proplapse. The mitral valve is grossly normal. No evidence of mitral valve regurgitation. No evidence of mitral stenosis.  6. The aortic valve is tricuspid. Aortic valve regurgitation is not visualized. No aortic stenosis is present.  7. The inferior vena cava is normal in size with greater than 50% respiratory variability, suggesting right atrial pressure of 3 mmHg.               DM 2 -  Lab Results  Component Value Date   HGBA1C 7.5 (H) 01/11/2023   on insulin ,        OSA -on nocturnal   CPAP    Hx of CVA -  with/out residual deficits on   Plavix      CKD stage IIIb  baseline Cr 2.2 Estimated Creatinine Clearance:  27.1 mL/min (A) (by C-G formula based on SCr of 1.88 mg/dL (H)).  Lab Results  Component Value Date   CREATININE 1.88 (H) 10/13/2023   CREATININE 2.28 (H) 01/13/2023   CREATININE 2.16 (H) 01/12/2023   Lab Results  Component Value Date   NA 137 10/13/2023   CL 106 10/13/2023   K 4.0 10/13/2023   CO2 23 10/13/2023   BUN 26 (H) 10/13/2023   CREATININE 1.88 (H) 10/13/2023   GFRNONAA 31 (L) 10/13/2023   CALCIUM  9.1 10/13/2023   PHOS 4.4 06/24/2021   ALBUMIN 3.4 (L) 01/11/2023   GLUCOSE 147 (H) 10/13/2023   Sp renal transplant on prograf , prednisone  and celcept    While in ER:    MRI showed right lacunar stroke     Lab Orders         CBC         Basic metabolic panel with GFR      CT HEAD   NON acute Chronic left corona radiata infarct.    MRI brain    acute CVA  Suspected acute lacunar infarct in the right thalamus. 2. Mild chronic small vessel ischemic disease.  CTA - No large vessel occlusion. Multiple thyroid nodules measuring up to 1.8 cm    CXR -  NON acute    Following Medications were ordered in ER: Medications  sodium chloride  0.9 % bolus 1,000 mL (0 mLs Intravenous Stopped 10/13/23 1947)  iohexol (OMNIPAQUE) 350 MG/ML injection 60 mL (60 mLs Intravenous Contrast Given 10/13/23 1703)    _______________________________________________________ ER Provider Called:     NEurology  Dr. Cleone Dad They Recommend admit to medicine   Will see in ER     ED Triage Vitals  Encounter Vitals Group     BP 10/13/23 1210 136/84     Systolic BP Percentile --      Diastolic BP Percentile --      Pulse Rate 10/13/23 1210 83     Resp 10/13/23 1210 17     Temp 10/13/23 1210 98.2 F (36.8 C)     Temp Source 10/13/23 1719 Oral     SpO2 10/13/23 1202 98 %     Weight 10/13/23 1207 130 lb (59 kg)     Height 10/13/23 1207 5' 2.5" (1.588 m)     Head Circumference --      Peak Flow --      Pain Score 10/13/23 1207 0     Pain Loc --      Pain Education --      Exclude from Growth Chart --   ZOXW(96)@     _________________________________________ Significant initial  Findings: Abnormal Labs Reviewed  CBC - Abnormal; Notable for the following components:      Result Value   WBC 13.0 (*)    All other components within normal limits  BASIC METABOLIC PANEL WITH GFR - Abnormal; Notable for the following components:   Glucose, Bld 147 (*)    BUN 26 (*)    Creatinine, Ser 1.88 (*)    GFR, Estimated 31 (*)    All other components within normal limits        ECG: Ordered   The recent clinical data is shown below. Vitals:   10/13/23 1202 10/13/23 1207 10/13/23 1210 10/13/23 1719  BP:   136/84 (!) 145/93  Pulse:   83 91  Resp:   17 18  Temp:   98.2 F (36.8 C) 98.4 F (36.9 C)  TempSrc:  Oral  SpO2: 98%  99% 100%  Weight:  59 kg    Height:   5' 2.5" (1.588 m)      WBC     Component Value Date/Time   WBC 13.0 (H) 10/13/2023 1255   LYMPHSABS 1.3 01/11/2023 1000   MONOABS 1.1 (H) 01/11/2023 1000   EOSABS 0.2 01/11/2023 1000   BASOSABS 0.0 01/11/2023 1000      UA  ordered     Results for orders placed or performed in visit on 01/19/19  TIQ-MISC     Status: None   Collection Time: 01/19/19  2:33 PM  Result Value Ref Range Status   QUESTION/PROBLEM:   Final    Comment: . There is a question regarding the following specimen submitted and/or the test requested. .    QUESTION: VERIFY SOURCE  Final    Comment: REQUESTED INFORMATION _________________________________ . AUTHORIZED SIGNATURE __________________________________ . TO PREVENT FURTHER DELAYS IN TESTING, PLEASE COMPLETE INFORMATION ABOVE AND FAX TO [phone] TO RESOLVE  THIS ORDER.     ________________________________________________________ Recent Labs  Lab 10/13/23 1255  NA 137  K 4.0  CO2 23  GLUCOSE 147*  BUN 26*  CREATININE 1.88*  CALCIUM  9.1    Cr  stable,    Lab Results  Component Value Date   CREATININE 1.88 (H) 10/13/2023   CREATININE 2.28 (H) 01/13/2023   CREATININE 2.16 (H) 01/12/2023    No results for input(s): "AST", "ALT", "ALKPHOS", "BILITOT", "PROT", "ALBUMIN" in the last 168 hours. Lab Results  Component Value Date   CALCIUM  9.1 10/13/2023   PHOS 4.4 06/24/2021     Plt: Lab Results  Component Value Date   PLT 205 10/13/2023    Recent Labs  Lab 10/13/23 1255  WBC 13.0*  HGB 13.9  HCT 43.3  MCV 90.4  PLT 205    HG/HCT   stable,      Component Value Date/Time   HGB 13.9 10/13/2023 1255   HCT 43.3 10/13/2023 1255   MCV 90.4 10/13/2023 1255   _________________________________________ Hospitalist was called for admission for   Acute ischemic stroke   The following Work up has been ordered so far:  Orders Placed This Encounter  Procedures   CT Head Wo Contrast   MR BRAIN WO CONTRAST   CT ANGIO HEAD NECK  W WO CM   CBC   Basic metabolic panel with GFR   Swallow screen   Consult to hospitalist     OTHER Significant initial  Findings:  labs showing:     DM  labs:  HbA1C: Recent Labs    01/11/23 1400  HGBA1C 7.5*      CBG (last 3)  No results for input(s): "GLUCAP" in the last 72 hours.        Cultures: No results found for: "SDES", "SPECREQUEST", "CULT", "REPTSTATUS"   Radiological Exams on Admission: MR BRAIN WO CONTRAST Result Date: 10/13/2023 CLINICAL DATA:  Neuro deficit, acute, stroke suspected. Left lower extremity numbness/weakness. EXAM: MRI HEAD WITHOUT CONTRAST TECHNIQUE: Multiplanar, multiecho pulse sequences of the brain and surrounding structures were obtained without intravenous contrast. COMPARISON:  Head CT and CTA 10/13/2023 and MRI 01/11/2023 FINDINGS: Brain: There is a 3 mm focus of mild diffusion weighted signal abnormality in the inferior right thalamus suspected to reflect an acute infarct (series 2, image 22 and series 3, image 16). A chronic infarct is noted in the left corona radiata. Scattered small T2 hyperintensities elsewhere in the cerebral white matter bilaterally are similar to  the prior MRI and are nonspecific but compatible with mild chronic small vessel ischemic disease. Cerebral volume is within normal limits for age. The ventricles are normal in size. No intracranial hemorrhage, mass, midline shift, or extra-axial fluid collection is identified. Vascular: Major intracranial vascular flow voids are preserved. Skull and upper cervical spine: Unremarkable bone marrow signal. Sinuses/Orbits: Unremarkable orbits. Paranasal sinuses and mastoid air cells are clear. Other: None. IMPRESSION: 1. Suspected acute lacunar infarct in the right thalamus. 2. Mild chronic small vessel ischemic disease. Electronically Signed   By: Aundra Lee M.D.   On: 10/13/2023 19:28   CT ANGIO HEAD NECK W WO CM Result Date: 10/13/2023 CLINICAL DATA:  Neuro deficit, concern for  stroke, left foot numbness and weakness for 24 hours. EXAM: CT ANGIOGRAPHY HEAD AND NECK WITH AND WITHOUT CONTRAST TECHNIQUE: Multidetector CT imaging of the head and neck was performed using the standard protocol during bolus administration of intravenous contrast. Multiplanar CT image reconstructions and MIPs were obtained to evaluate the vascular anatomy. Carotid stenosis measurements (when applicable) are obtained utilizing NASCET criteria, using the distal internal carotid diameter as the denominator. RADIATION DOSE REDUCTION: This exam was performed according to the departmental dose-optimization program which includes automated exposure control, adjustment of the mA and/or kV according to patient size and/or use of iterative reconstruction technique. CONTRAST:  60mL OMNIPAQUE IOHEXOL 350 MG/ML SOLN COMPARISON:  Same-day head CT.  MRA head 01/12/2023. FINDINGS: CTA NECK FINDINGS Aortic arch: Standard configuration of the aortic arch. Imaged portion shows no evidence of aneurysm or dissection. No significant stenosis of the major arch vessel origins. Pulmonary arteries: As permitted by contrast timing, there are no filling defects in the visualized pulmonary arteries. Subclavian arteries: The subclavian arteries are patent bilaterally. Right carotid system: No evidence of dissection, stenosis (50% or greater), or occlusion. Mild atherosclerosis along the proximal cervical ICA without stenosis. Left carotid system: No evidence of dissection, stenosis (50% or greater), or occlusion. Mild atherosclerosis at the carotid bifurcation without significant stenosis. Vertebral arteries: Codominant. No evidence of dissection, stenosis (50% or greater), or occlusion. Skeleton: No acute findings. Degenerative changes in the cervical spine. Disc space narrowing most pronounced at C5-6 and C6-7. Reversal of the normal cervical lordosis. Other neck: The visualized airway is patent. No cervical lymphadenopathy. Multiple thyroid  nodules the largest measuring up to 1.8 cm. Upper chest: Visualized lung apices are clear. Review of the MIP images confirms the above findings CTA HEAD FINDINGS ANTERIOR CIRCULATION: The intracranial ICAs are patent bilaterally. Mild atherosclerosis of the right cavernous ICA. No significant stenosis, proximal occlusion, aneurysm, or vascular malformation. MCAs: The middle cerebral arteries are patent bilaterally. ACAs: Patent bilaterally. Diminutive left A1 segment, likely congenital. POSTERIOR CIRCULATION: No significant stenosis, proximal occlusion, aneurysm, or vascular malformation. PCAs: The posterior cerebral arteries are patent bilaterally. Pcomm: Not well visualized. SCAs: The superior cerebellar arteries are patent bilaterally. Basilar artery: Patent AICAs: Patent PICAs: Patent Vertebral arteries: The intracranial vertebral arteries are patent. Venous sinuses: As permitted by contrast timing, patent. Anatomic variants: None Review of the MIP images confirms the above findings IMPRESSION: No large vessel occlusion. No high-grade stenosis, aneurysm, or dissection of the arteries in the head and neck. Mild scattered atherosclerosis as above. Multiple thyroid nodules measuring up to 1.8 cm. Recommend nonemergent correlation with thyroid ultrasound. Electronically Signed   By: Denny Flack M.D.   On: 10/13/2023 18:59   CT Head Wo Contrast Result Date: 10/13/2023 CLINICAL DATA:  Neuro deficit, acute, stroke suspected. Left foot/lower  leg numbness/weakness. EXAM: CT HEAD WITHOUT CONTRAST TECHNIQUE: Contiguous axial images were obtained from the base of the skull through the vertex without intravenous contrast. RADIATION DOSE REDUCTION: This exam was performed according to the departmental dose-optimization program which includes automated exposure control, adjustment of the mA and/or kV according to patient size and/or use of iterative reconstruction technique. COMPARISON:  Head MRI 01/11/2023 FINDINGS:  Brain: There is no evidence of an acute infarct, intracranial hemorrhage, mass, midline shift, or extra-axial fluid collection. Cerebral volume is normal. The ventricles are normal in size. A chronic infarct in the left corona radiata was acute on the prior MRI. Vascular: Calcified atherosclerosis at the skull base. No hyperdense vessel. Skull: No fracture or suspicious lesion. Sinuses/Orbits: Visualized paranasal sinuses and mastoid air cells are clear. Unremarkable orbits. Other: None. IMPRESSION: 1. No evidence of acute intracranial abnormality. 2. Chronic left corona radiata infarct. Electronically Signed   By: Aundra Lee M.D.   On: 10/13/2023 15:21   _______________________________________________________________________________________________________ Latest  Blood pressure (!) 145/93, pulse 91, temperature 98.4 F (36.9 C), temperature source Oral, resp. rate 18, height 5' 2.5" (1.588 m), weight 59 kg, last menstrual period 10/24/2012, SpO2 100%.   Vitals  labs and radiology finding personally reviewed  Review of Systems:    Pertinent positives include:  left leg weakness and numbness  Constitutional:  No weight loss, night sweats, Fevers, chills, fatigue, weight loss  HEENT:  No headaches, Difficulty swallowing,Tooth/dental problems,Sore throat,  No sneezing, itching, ear ache, nasal congestion, post nasal drip,  Cardio-vascular:  No chest pain, Orthopnea, PND, anasarca, dizziness, palpitations.no Bilateral lower extremity swelling  GI:  No heartburn, indigestion, abdominal pain, nausea, vomiting, diarrhea, change in bowel habits, loss of appetite, melena, blood in stool, hematemesis Resp:  no shortness of breath at rest. No dyspnea on exertion, No excess mucus, no productive cough, No non-productive cough, No coughing up of blood.No change in color of mucus.No wheezing. Skin:  no rash or lesions. No jaundice GU:  no dysuria, change in color of urine, no urgency or frequency. No  straining to urinate.  No flank pain.  Musculoskeletal:  No joint pain or no joint swelling. No decreased range of motion. No back pain.  Psych:  No change in mood or affect. No depression or anxiety. No memory loss.  Neuro: no localizing neurological complaints, no tingling, no weakness, no double vision, no gait abnormality, no slurred speech, no confusion  All systems reviewed and apart from HOPI all are negative _______________________________________________________________________________________________ Past Medical History:   Past Medical History:  Diagnosis Date   Chronic kidney disease    has kidney transplant - having aphoresis now every other week   Diabetes mellitus without complication (HCC)    dm type 2 - fasting 150   History of blood product transfusion    Hyperlipidemia    Hypertension    Menopausal hot flushes 09/06/2017   Controlled on Lexapro  by Dr. Asencion Blacksmith   Neuromuscular disorder [facility])      Past Surgical History:  Procedure Laterality Date   COLONOSCOPY     diateck catheter placement  09/2013   KIDNEY TRANSPLANT     ORIF ANKLE FRACTURE Left 2014   TEE WITHOUT CARDIOVERSION N/A 01/13/2023   Procedure: TRANSESOPHAGEAL ECHOCARDIOGRAM;  Surgeon: Jann Melody, MD;  Location: [facility]VASIVE [facility];  Service: Cardiovascular;  Laterality: N/A;    Social History:  Ambulatory   independently      reports that she has been smoking cigarettes. She started smoking  about 37 years ago. She has a 0.7 pack-year smoking history. She has never used smokeless tobacco. She reports current alcohol use of about 1.0 standard drink of alcohol per week. She reports that she does not use drugs.   Family History:   Family History  Problem Relation Age of Onset   Hypertension Mother    Other Father 53       amyloidosis   Healthy Brother    Stroke Paternal Grandmother     ______________________________________________________________________________________________ Allergies: Allergies  Allergen Reactions   Cefuroxime Swelling and Other (See Comments)    Ceftin   Benadryl [Diphenhydramine Hcl] Swelling, Anxiety and Other (See Comments)    Hyperactivity and angioedema, also   Tape Rash and Other (See Comments)    Paper tape is okay   Wound Dressing Adhesive Rash and Other (See Comments)    Paper tape is ok     Prior to Admission medications   Medication Sig Start Date End Date Taking? Authorizing Provider  Accu-Chek FastClix Lancets MISC USE AS DIRECTED UP TO FOUR TIMES DAILY 04/09/21   Luevenia Saha, MD  ACCU-CHEK GUIDE test strip USE TO TEST AS DIRECTED UP TO 4 TIMES DAILY 03/25/20   Luevenia Saha, MD  albuterol  (PROVENTIL ) (2.5 MG/3ML) 0.083% nebulizer solution Take 3 mLs (2.5 mg total) by nebulization 2 (two) times daily as needed for wheezing or shortness of breath. 01/29/21   Luevenia Saha, MD  albuterol  (VENTOLIN  HFA) 108 [id number] Base) MCG/ACT inhaler Inhale 2 puffs into the lungs every 6 (six) hours as needed for wheezing or shortness of breath. 11/03/20   Luevenia Saha, MD  atenolol (TENORMIN) 100 MG tablet Take 100 mg by mouth at bedtime.    [provider]  benazepril (LOTENSIN) 40 MG tablet Take 40 mg by mouth in the morning and at bedtime.    [provider]  blood glucose meter kit and supplies KIT Dispense based on patient and insurance preference. Use up to four times daily as directed. 08/22/19   Luevenia Saha, MD  buPROPion  (WELLBUTRIN  XL) 150 MG 24 hr tablet Take 1 tablet (150 mg total) by mouth daily. 08/10/23   Dohmeier, Raoul Byes, MD  clopidogrel  (PLAVIX ) 75 MG tablet Take 75 mg by mouth daily.    [provider]  escitalopram  (LEXAPRO ) 10 MG tablet Take 10 mg by mouth at bedtime.    [provider]  FARXIGA  10 MG TABS tablet Take 10 mg by mouth in the morning.    [provider]   furosemide  (LASIX ) 40 MG tablet Take 1 tablet (40 mg total) by mouth daily as needed. Patient taking differently: Take 40 mg by mouth daily as needed for edema or fluid. 06/24/21   Luevenia Saha, MD  insulin  glargine (LANTUS  SOLOSTAR) 100 UNIT/ML Solostar Pen Inject 100 Units into the skin daily. 07/25/19   [provider]  Insulin  Pen Needle 32G X 4 MM MISC 50 each by Does not apply route in the morning and at bedtime. 07/25/19   Luevenia Saha, MD  ipratropium (ATROVENT ) 0.02 % nebulizer solution Take 2.5 mLs (0.5 mg total) by nebulization 2 (two) times daily as needed for wheezing or shortness of breath. 01/29/21   Luevenia Saha, MD  KERENDIA  20 MG TABS Take 20 mg by mouth in the morning.    [provider]  mycophenolate  (CELLCEPT ) 500 MG tablet Take 500 mg by mouth in the morning and at bedtime.    [provider]  nicotine (NICODERM CQ - DOSED IN MG/24 HOURS) 21 mg/24hr patch Place 21 mg onto the skin daily. 01/27/23   [provider]  Omega-3 Fatty Acids (FISH OIL) 1000 MG CAPS Take 1,000 mg by mouth in the morning and at bedtime. 02/13/14   [provider]  OZEMPIC, 1 MG/DOSE, 4 MG/3ML SOPN Inject 1 mg into the skin every Wednesday.    [provider]  predniSONE  (DELTASONE ) 5 MG tablet Take 5 mg by mouth daily with breakfast.    [provider]  rosuvastatin  (CRESTOR ) 20 MG tablet Take 20 mg by mouth daily. Patient not taking: Reported on 08/10/2023    [provider]  rosuvastatin  (CRESTOR ) 40 MG tablet Take 1 tablet (40 mg total) by mouth at bedtime. 01/13/23 08/10/23  Lesa Rape, MD  sodium bicarbonate  650 MG tablet Take 1,300 mg by mouth 3 (three) times daily.    [provider]  tacrolimus  (PROGRAF ) 1 MG capsule Take 1 mg by mouth in the morning and at bedtime.    [provider]  Tetrahydroz-Dextran-PEG-Povid (VISINE ADVANCED RELIEF OP) Place 1 drop into both eyes 3 (three) times daily as needed (for  dryness).    [provider]  TRESIBA FLEXTOUCH 100 UNIT/ML FlexTouch Pen Inject 13 Units into the skin at bedtime.    [provider]  zolpidem  (AMBIEN ) 10 MG tablet Take 10 mg by mouth at bedtime as needed for sleep. 12/06/21   [provider]    ___________________________________________________________________________________________________ Physical Exam:    10/13/2023    5:19 PM 10/13/2023   12:10 PM 10/13/2023   12:07 PM  Vitals with BMI  Height   5' 2.5"  Weight   130 lbs  BMI   23.38  Systolic 145 136   Diastolic 93 84   Pulse 91 83      1. General:  in No  Acute distress   Chronically ill  -appearing 2. Psychological: Alert and   Oriented 3. Head/ENT:  Dry Mucous Membranes                          Head Non traumatic, neck supple                           Poor Dentition 4. SKIN:  decreased Skin turgor,  Skin clean Dry and intact no rash    5. Heart: Regular rate and rhythm systolic Murmur, no Rub or gallop 6. Lungs:  no wheezes or crackles   7. Abdomen: Soft,  non-tender, Non distended   bowel sounds present 8. Lower extremities: no clubbing, cyanosis, no  edema 9. Neurologically strength 5 out of 5 in all 4 extremities cranial nerves II through XII intact 10. MSK: Normal range of motion    Chart has been reviewed  ______________________________________________________________________________________________  Assessment/Plan 58 y.o. female with medical history significant of CKD, DM2, HLD, HTN, OSA, tobacco abuse, stroke   Admitted for   Acute ischemic stroke     Present on Admission:  Stroke (HCC)  CKD (chronic kidney disease)  Combined hyperlipidemia associated with type 2 diabetes mellitus (HCC)  FSGS (focal segmental glomerulosclerosis)  Internal hemorrhoids  Tobacco abuse    OSA on CPAP Will continue  CKD (chronic kidney disease)  -chronic avoid nephrotoxic medications such as NSAIDs, Vanco Zosyn combo,  avoid  hypotension, continue to follow renal function   Combined hyperlipidemia associated with type 2  diabetes mellitus (HCC) Continue Crestor  40 mg a day  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 twice daily prednisone  5 mg daily and Prograf  1 mg p.o. twice daily Renal function within baseline  Hypertension due to kidney transplant Allow permissive hypertension for tonight  Internal hemorrhoids Anusol cream  Stroke [facility]) New CVA.  - will admit based on TIA/CVA protocol,         Monitor on Tele        MRI  Resulted - showing acute ischemic CVA        CTA nonacute, no LVO       Echo to evaluate for possible embolic source,        obtain cardiac enzymes,  ECG,   Lipid panel, TSH.        Order PT/OT evaluation.        Patient passed swallow eval will restart diabetic diet      Will make sure patient is on antiplatelet ASA 81 ,  Plavix   and statin        Allow permissive Hypertension keep BP <220/120        Neurology consulted  will see    DM2 (diabetes mellitus, type 2) (HCC)  - Order Sensitive SSI   - continue home insulin  but decreased to  5 units,  -  check TSH and HgA1C  - Hold by mouth medications     Tobacco abuse  - Spoke about importance of quitting spent 5 minutes discussing options for treatment, prior attempts at quitting, and dangers of smoking  -At this point patient is    interested in quitting  - order nicotine patch   - nursing tobacco cessation protocol   Other plan as per orders.  DVT prophylaxis:  SCD      Code Status:    Code Status: Prior FULL CODE  as per patient   I had personally discussed CODE STATUS with patient   ACP   none   Family Communication:   Family not at  Bedside    Diet  diabetic diet   Disposition Plan:      To home once workup is complete and patient is stable   Following barriers for discharge:                             Stroke  work up is complete                             set up                           Will need consultants to evaluate patient prior to discharge                                   Consult Orders  (From admission, onward)           Start     Ordered   10/13/23 1946  Consult to hospitalist  PG SENT BY DELORIS  Once       Provider:  (Not yet assigned)  Question Answer Comment  Place call to: [facility]   Reason for Consult Admit      10/13/23 1945  Would benefit from PT/OT eval prior to DC  Ordered                    Diabetes care coordinator            Consults called:  Neurology      Admission status:  ED Disposition     ED Disposition  Admit   Condition  --   Comment  Hospital Area: [facility] [[id number]]  Level of Care: Telemetry Medical [[id number]]  May admit patient to Arlin Benes or Maryan Smalling if equivalent level of care is available:: No  Covid Evaluation: Asymptomatic - no recent exposure (last 10 days) testing not required  Diagnosis: Stroke [facility]) [[id number]]  Admitting Physician: Tashya Alberty [[id number]]  Attending Physician: Tami Blass [[id number]]  Certification:: I certify this patient will need inpatient services for at least 2 midnights  Expected Medical Readiness: 10/16/2023            inpatient     I Expect 2 midnight stay secondary to severity of patient's current illness need for inpatient interventions justified by the following:    Severe lab/radiological/exam abnormalities including:    Acute ischemic stroke [facility])    and extensive comorbidities including:  DM2  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   I expect  patient to be hospitalized for 2 midnights requiring inpatient medical care.  Patient is at high risk for adverse outcome (such as loss of life or disability) if not  treated.  Indication for inpatient stay as follows:   Stroke workup Need for   IV fluids,      Level of care     tele  For  24H        Maizee Reinhold 10/14/2023, 1:15 AM    [facility]     after 2 AM please page floor coverage   If 7AM-7PM, please contact the day team taking care of the patient using [email or web address]  Anemia

## 2023-10-14 ENCOUNTER — Other Ambulatory Visit (HOSPITAL_COMMUNITY): Payer: Self-pay

## 2023-10-14 ENCOUNTER — Inpatient Hospital Stay (HOSPITAL_COMMUNITY)

## 2023-10-14 ENCOUNTER — Other Ambulatory Visit (HOSPITAL_BASED_OUTPATIENT_CLINIC_OR_DEPARTMENT_OTHER): Payer: Self-pay

## 2023-10-14 DIAGNOSIS — Z87891 Personal history of nicotine dependence: Secondary | ICD-10-CM

## 2023-10-14 DIAGNOSIS — I639 Cerebral infarction, unspecified: Secondary | ICD-10-CM | POA: Diagnosis not present

## 2023-10-14 DIAGNOSIS — I6381 Other cerebral infarction due to occlusion or stenosis of small artery: Secondary | ICD-10-CM | POA: Diagnosis not present

## 2023-10-14 DIAGNOSIS — I129 Hypertensive chronic kidney disease with stage 1 through stage 4 chronic kidney disease, or unspecified chronic kidney disease: Secondary | ICD-10-CM

## 2023-10-14 DIAGNOSIS — I6389 Other cerebral infarction: Secondary | ICD-10-CM

## 2023-10-14 DIAGNOSIS — E785 Hyperlipidemia, unspecified: Secondary | ICD-10-CM

## 2023-10-14 DIAGNOSIS — Z7984 Long term (current) use of oral hypoglycemic drugs: Secondary | ICD-10-CM

## 2023-10-14 DIAGNOSIS — E119 Type 2 diabetes mellitus without complications: Secondary | ICD-10-CM

## 2023-10-14 DIAGNOSIS — R29701 NIHSS score 1: Secondary | ICD-10-CM | POA: Diagnosis not present

## 2023-10-14 DIAGNOSIS — E1151 Type 2 diabetes mellitus with diabetic peripheral angiopathy without gangrene: Secondary | ICD-10-CM

## 2023-10-14 DIAGNOSIS — N189 Chronic kidney disease, unspecified: Secondary | ICD-10-CM

## 2023-10-14 DIAGNOSIS — Z7985 Long-term (current) use of injectable non-insulin antidiabetic drugs: Secondary | ICD-10-CM

## 2023-10-14 DIAGNOSIS — E1122 Type 2 diabetes mellitus with diabetic chronic kidney disease: Secondary | ICD-10-CM | POA: Diagnosis not present

## 2023-10-14 LAB — HEPATIC FUNCTION PANEL
ALT: 29 U/L (ref 0–44)
AST: 22 U/L (ref 15–41)
Albumin: 2.9 g/dL — ABNORMAL LOW (ref 3.5–5.0)
Alkaline Phosphatase: 89 U/L (ref 38–126)
Bilirubin, Direct: <0.1 mg/dL (ref 0.0–0.2)
Total Bilirubin: 0.5 mg/dL (ref 0.0–1.2)
Total Protein: 5.3 g/dL — ABNORMAL LOW (ref 6.5–8.1)

## 2023-10-14 LAB — CBC
HCT: 41 % (ref 36.0–46.0)
Hemoglobin: 13 g/dL (ref 12.0–15.0)
MCH: 28.6 pg (ref 26.0–34.0)
MCHC: 31.7 g/dL (ref 30.0–36.0)
MCV: 90.3 fL (ref 80.0–100.0)
Platelets: 191 10*3/uL (ref 150–400)
RBC: 4.54 MIL/uL (ref 3.87–5.11)
RDW: 13.4 % (ref 11.5–15.5)
WBC: 10.9 10*3/uL — ABNORMAL HIGH (ref 4.0–10.5)
nRBC: 0 % (ref 0.0–0.2)

## 2023-10-14 LAB — COMPREHENSIVE METABOLIC PANEL WITH GFR
ALT: 29 U/L (ref 0–44)
AST: 23 U/L (ref 15–41)
Albumin: 2.9 g/dL — ABNORMAL LOW (ref 3.5–5.0)
Alkaline Phosphatase: 87 U/L (ref 38–126)
Anion gap: 8 (ref 5–15)
BUN: 20 mg/dL (ref 6–20)
CO2: 22 mmol/L (ref 22–32)
Calcium: 8.9 mg/dL (ref 8.9–10.3)
Chloride: 108 mmol/L (ref 98–111)
Creatinine, Ser: 1.77 mg/dL — ABNORMAL HIGH (ref 0.44–1.00)
GFR, Estimated: 33 mL/min — ABNORMAL LOW (ref >60)
Glucose, Bld: 74 mg/dL (ref 70–99)
Potassium: 3.7 mmol/L (ref 3.5–5.1)
Sodium: 138 mmol/L (ref 135–145)
Total Bilirubin: 0.8 mg/dL (ref 0.0–1.2)
Total Protein: 5.4 g/dL — ABNORMAL LOW (ref 6.5–8.1)

## 2023-10-14 LAB — CK: Total CK: 195 U/L (ref 38–234)

## 2023-10-14 LAB — RAPID URINE DRUG SCREEN, HOSP PERFORMED
Amphetamines: NOT DETECTED
Barbiturates: NOT DETECTED
Benzodiazepines: NOT DETECTED
Cocaine: NOT DETECTED
Opiates: NOT DETECTED
Tetrahydrocannabinol: POSITIVE — AB

## 2023-10-14 LAB — ECHOCARDIOGRAM COMPLETE
AR max vel: 2.43 cm2
AV Peak grad: 15.4 mmHg
Ao pk vel: 1.96 m/s
Area-P 1/2: 3.03 cm2
Height: 62.5 in
MV VTI: 4.13 cm2
S' Lateral: 2.3 cm
Weight: 2080 [oz_av]

## 2023-10-14 LAB — LIPID PANEL
Cholesterol: 100 mg/dL (ref 0–200)
HDL: 28 mg/dL — ABNORMAL LOW (ref >40)
LDL Cholesterol: 32 mg/dL (ref 0–99)
Total CHOL/HDL Ratio: 3.6 ratio
Triglycerides: 199 mg/dL — ABNORMAL HIGH (ref <150)
VLDL: 40 mg/dL (ref 0–40)

## 2023-10-14 LAB — URINALYSIS, COMPLETE (UACMP) WITH MICROSCOPIC
Bilirubin Urine: NEGATIVE
Glucose, UA: >=500 mg/dL — AB
Ketones, ur: NEGATIVE mg/dL
Leukocytes,Ua: NEGATIVE
Nitrite: NEGATIVE
Protein, ur: 100 mg/dL — AB
Specific Gravity, Urine: 1.017 (ref 1.005–1.030)
pH: 7 (ref 5.0–8.0)

## 2023-10-14 LAB — PHOSPHORUS: Phosphorus: 3.1 mg/dL (ref 2.5–4.6)

## 2023-10-14 LAB — GLUCOSE, CAPILLARY
Glucose-Capillary: 193 mg/dL — ABNORMAL HIGH (ref 70–99)
Glucose-Capillary: 320 mg/dL — ABNORMAL HIGH (ref 70–99)
Glucose-Capillary: 72 mg/dL (ref 70–99)
Glucose-Capillary: 83 mg/dL (ref 70–99)

## 2023-10-14 LAB — MAGNESIUM: Magnesium: 1.6 mg/dL — ABNORMAL LOW (ref 1.7–2.4)

## 2023-10-14 LAB — HEMOGLOBIN A1C
Hgb A1c MFr Bld: 8.7 % — ABNORMAL HIGH (ref 4.8–5.6)
Mean Plasma Glucose: 202.99 mg/dL

## 2023-10-14 LAB — HIV ANTIBODY (ROUTINE TESTING W REFLEX): HIV Screen 4th Generation wRfx: NONREACTIVE

## 2023-10-14 LAB — TSH: TSH: 1.678 u[IU]/mL (ref 0.350–4.500)

## 2023-10-14 MED ORDER — TICAGRELOR 90 MG PO TABS
90.0000 mg | ORAL_TABLET | Freq: Two times a day (BID) | ORAL | 0 refills | Status: AC
  Filled 2023-10-14: qty 60, 30d supply, fill #0

## 2023-10-14 MED ORDER — ROSUVASTATIN CALCIUM 40 MG PO TABS
40.0000 mg | ORAL_TABLET | Freq: Every day | ORAL | 0 refills | Status: DC
  Filled 2023-10-14: qty 30, 30d supply, fill #0

## 2023-10-14 MED ORDER — ASPIRIN 325 MG PO TBEC
325.0000 mg | DELAYED_RELEASE_TABLET | Freq: Every day | ORAL | 3 refills | Status: DC
  Filled 2023-10-14: qty 90, 90d supply, fill #0

## 2023-10-14 MED ORDER — INSULIN ASPART 100 UNIT/ML IJ SOLN: 0.0000 [IU] | Freq: Every day | INTRAMUSCULAR | Status: DC

## 2023-10-14 MED ORDER — HYDROCORTISONE (PERIANAL) 2.5 % EX CREA
TOPICAL_CREAM | Freq: Two times a day (BID) | CUTANEOUS | Status: DC
  Filled 2023-10-14: qty 28.35

## 2023-10-14 MED ORDER — MAGNESIUM OXIDE -MG SUPPLEMENT 400 (240 MG) MG PO TABS
400.0000 mg | ORAL_TABLET | Freq: Every day | ORAL | 0 refills | Status: AC
  Filled 2023-10-14: qty 14, 14d supply, fill #0

## 2023-10-14 MED ORDER — NICOTINE 7 MG/24HR TD PT24
7.0000 mg | MEDICATED_PATCH | Freq: Every day | TRANSDERMAL | Status: DC
  Filled 2023-10-14: qty 1

## 2023-10-14 MED ORDER — MAGNESIUM OXIDE -MG SUPPLEMENT 400 (240 MG) MG PO TABS
800.0000 mg | ORAL_TABLET | Freq: Every day | ORAL | Status: DC
  Administered 2023-10-14: 800 mg via ORAL
  Filled 2023-10-14: qty 2

## 2023-10-14 MED ORDER — INSULIN ASPART 100 UNIT/ML IJ SOLN
0.0000 [IU] | Freq: Three times a day (TID) | INTRAMUSCULAR | Status: DC
  Administered 2023-10-14: 7 [IU] via SUBCUTANEOUS

## 2023-10-14 MED ORDER — TICAGRELOR 90 MG PO TABS
90.0000 mg | ORAL_TABLET | Freq: Two times a day (BID) | ORAL | Status: DC
Start: 2023-10-15 — End: 2023-10-14

## 2023-10-14 NOTE — Progress Notes (Signed)
 OT Treatment Note  Educated pt/husband on use of footstrap LLE to assist dorsiflexion and reduce risk of falls. Issed level 1 T-band for exercises. Educated pt/husband on warning signs/symptoms of CVA using BeFASt. Pt/family verbalized understanding.    10/14/23 1456  OT Visit Information  Last OT Received On 10/14/23  Assistance Needed +1  History of Present Illness 58 yo admitted with L leg weakness. MRI shows small acute right thalamic PMH: HTN, HLD, DM2, hx of kidney transplant, CVA.  Precautions  Precautions Fall  General Comments  General comments (skin integrity, edema, etc.) Fit with a footdrop strap to assist with dorsiflexion when walking. Gait pattern and foot clearnace imporved with use. Educated on donning/doffing strap. Strap can also be used for exercise. PT/husband verbalized understanding.  Other Exercises  Other Exercises level 1 theraband dorsi/plantarflexion x 10; inversion/eversion x 10  OT - End of Session  Activity Tolerance Patient tolerated treatment well  Patient left in bed;with call bell/phone within reach;with family/visitor present  Nurse Communication Mobility status  OT Assessment/Plan  OT Visit Diagnosis Unsteadiness on feet (R26.81);Other abnormalities of gait and mobility (R26.89);Muscle weakness (generalized) (M62.81)  OT Frequency (ACUTE ONLY) Min 2X/week  Follow Up Recommendations No OT follow up  Patient can return home with the following Assistance with cooking/housework  OT Equipment None recommended by OT  AM-PAC OT "6 Clicks" Daily Activity Outcome Measure (Version 2)  Help from another person eating meals? 4  Help from another person taking care of personal grooming? 4  Help from another person toileting, which includes using toliet, bedpan, or urinal? 4  Help from another person bathing (including washing, rinsing, drying)? 4  Help from another person to put on and taking off regular upper body clothing? 4  Help from another person to put on  and taking off regular lower body clothing? 4  6 Click Score 24  Progressive Mobility  What is the highest level of mobility based on the progressive mobility assessment? Level 5 (Walks with assist in room/hall) - Balance while stepping forward/back and can walk in room with assist - Complete  Activity Ambulated with assistance in room  OT Goal Progression  Progress towards OT goals Goals met/education completed, patient discharged from OT  Acute Rehab OT Goals  Patient Stated Goal to get better  OT Goal Formulation With patient/family  Time For Goal Achievement 10/28/23  Potential to Achieve Goals Good  OT Time Calculation  OT Start Time (ACUTE ONLY) 1431  OT Stop Time (ACUTE ONLY) 1453  OT Time Calculation (min) 22 min  OT General Charges  $OT Visit 1 Visit  OT Treatments  $Therapeutic Activity 8-22 mins   Milburn Aliment, OT/L   Acute OT Clinical Specialist Acute Rehabilitation Services Pager [phone] Office [phone]

## 2023-10-14 NOTE — Evaluation (Signed)
 Occupational Therapy Evaluation Patient Details Name: Isabella Obrien MRN: [id number] DOB: September 06, 1965 Today's Date: 10/14/2023   History of Present Illness   58 yo admitted with L leg weakness. MRI shows small acute right thalamic PMH: HTN, HLD, DM2, hx of kidney transplant, CVA.     Clinical Impressions PTA pt lives independently with her daughter and husband and works as an Data processing manager at an [employer]. Pt continues to demonstrate L lower leg weakness and numbness, increasing her risk for falls. "I almost fell a couple of times, that's why I called my doctor".  Will return for pt to try a temporary footstrap to assist with dorsiflexion to reduce risk of falls. No OT needed after DC.      If plan is discharge home, recommend the following:   Assistance with cooking/housework     Functional Status Assessment   Patient has had a recent decline in their functional status and demonstrates the ability to make significant improvements in function in a reasonable and predictable amount of time.     Equipment Recommendations   None recommended by OT     Recommendations for Other Services         Precautions/Restrictions   Precautions Precautions: Fall Restrictions Weight Bearing Restrictions Per Provider Order: No     Mobility Bed Mobility Overal bed mobility: Independent                  Transfers Overall transfer level: Independent                        Balance Overall balance assessment: Needs assistance   Sitting balance-Leahy Scale: Normal       Standing balance-Leahy Scale: Fair                             ADL either performed or assessed with clinical judgement   ADL Overall ADL's : At baseline                                       General ADL Comments: basic ADL tasks     Vision Baseline Vision/History: 0 No visual deficits       Perception         Praxis          Pertinent Vitals/Pain Pain Assessment Pain Assessment: No/denies pain     Extremity/Trunk Assessment Upper Extremity Assessment Upper Extremity Assessment: Overall WFL for tasks assessed   Lower Extremity Assessment Lower Extremity Assessment: Defer to PT evaluation (L dorsi/plantarflexion weakness; numbness)   Cervical / Trunk Assessment Cervical / Trunk Assessment: Normal   Communication Communication Communication: No apparent difficulties   Cognition Arousal: Alert Behavior During Therapy: WFL for tasks assessed/performed Cognition: No apparent impairments                               Following commands: Intact       Cueing  General Comments      compensates for L foot weakness with increased hip flexion; scissored x 1 with mobility   Exercises Exercises: Other exercises (alphabet drawing wiht ankle)   Shoulder Instructions      Home Living Family/patient expects to be discharged to:: Private residence Living Arrangements: Spouse/significant other Available Help at Discharge: Family;Available PRN/intermittently Type  of Home: House Home Access: Stairs to enter [employer] of Steps: 6 Entrance Stairs-Rails: Right Home Layout: One level     Bathroom Shower/Tub: Tub/shower unit;Walk-in shower   Bathroom Toilet: Standard Bathroom Accessibility: No   Home Equipment: Hand held shower head          Prior Functioning/Environment Prior Level of Function : Independent/Modified Independent;Working/employed;Driving             Mobility Comments: Ind ADLs Comments: works as a Geologist, 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[employer] Problem List: Decreased strength;Impaired balance (sitting and/or standing);Decreased knowledge of precautions   OT Treatment/Interventions: Self-care/ADL training;Therapeutic exercise;DME and/or AE instruction;Therapeutic activities;Balance training      OT Goals(Current goals can be found in the care plan  section)   Acute Rehab OT Goals Patient Stated Goal: for her foot to get better OT Goal Formulation: With patient Time For Goal Achievement: 10/28/23 Potential to Achieve Goals: Good   OT Frequency:  Min 2X/week    Co-evaluation              AM-PAC OT "6 Clicks" Daily Activity     Outcome Measure Help from another person eating meals?: None Help from another person taking care of personal grooming?: None Help from another person toileting, which includes using toliet, bedpan, or urinal?: None Help from another person bathing (including washing, rinsing, drying)?: None Help from another person to put on and taking off regular upper body clothing?: None Help from another person to put on and taking off regular lower body clothing?: None 6 Click Score: 24   End of Session Equipment Utilized During Treatment: Gait belt Nurse Communication: Mobility status  Activity Tolerance: Patient tolerated treatment well Patient left: in bed;with call bell/phone within reach  OT Visit Diagnosis: Unsteadiness on feet (R26.81);Other abnormalities of gait and mobility (R26.89);Muscle weakness (generalized) (M62.81)                Time: 1610-9604 OT Time Calculation (min): 14 min Charges:  OT General Charges $OT Visit: 1 Visit OT Evaluation $OT Eval Low Complexity: 1 Low  Kelin Nixon, OT/L   Acute OT Clinical Specialist Acute Rehabilitation Services Pager [phone] Office [phone]   [facility] 10/14/2023, 2:00 PM

## 2023-10-14 NOTE — Discharge Summary (Signed)
 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  MRI consistent with right thalamic stroke.  Right MCA stroke: Clinical findings, left lower extremity weakness.  Mostly resolved. CT head findings, no acute findings.  No bleeding. MRI of the brain, acute lacunar infarct right thalamus.  Mild chronic small vessel disease. CTA head and neck, no large vessel occlusion.  No high-grade stenosis or dissection.  Multiple thyroid nodules. 2D echocardiogram, normal ejection fraction.  No intracardiac thrombus. Antiplatelet therapy, on Plavix  at home. LDL 32, on Crestor  40 mg daily. Hemoglobin A1c, 8.7.    Plan: As per neurology recommendation, patient is discharged home on aspirin  325 mg daily to continue forever, Brilinta 90 mg twice daily for 28 days.  She will continue Crestor .  She will continue outpatient therapies.  A1c is elevated at 8.7, recommended ongoing outpatient follow-up.  Smoking cessation counseling.   Discharge Diagnoses:  Principal Problem:   Acute ischemic stroke [facility]) Active Problems:   Hypertension due to kidney transplant   History of living-donor kidney transplantation   Combined hyperlipidemia associated with type 2 diabetes mellitus (HCC)   FSGS (focal segmental glomerulosclerosis)   CKD (chronic  kidney disease)   Internal hemorrhoids   Tobacco abuse   OSA on CPAP   DM2 (diabetes mellitus, type 2) [facility])    Discharge Instructions  Discharge Instructions     Ambulatory referral to Neurology   Complete by: As directed    An appointment is requested in approximately: 4 weeks   Ambulatory referral to Physical Therapy   Complete by: As directed    Diet - low sodium heart healthy   Complete by: As directed    Diet Carb Modified   Complete by: As directed    Increase activity slowly   Complete by: As directed       Allergies as of 10/14/2023       Reactions   Cefuroxime Swelling, Other (See Comments)   Ceftin:hypertension   Benadryl [diphenhydramine Hcl] Swelling, Anxiety, Other (See Comments)   Hyperactivity and angioedema, also   Tape Rash, Other (See Comments)   Paper tape is okay   Wound Dressing Adhesive Rash, Other (See Comments)   Paper tape is ok        Medication List     STOP taking these medications    clopidogrel  75 MG tablet Commonly known as: PLAVIX        TAKE these medications    albuterol  108 (90 Base) MCG/ACT inhaler Commonly known as: VENTOLIN  HFA Inhale 2 puffs into the lungs every 6 (six) hours as needed for wheezing or shortness of breath.   albuterol  (2.5 MG/3ML) 0.083% nebulizer solution Commonly known as: PROVENTIL  Take 3 mLs (2.5 mg total) by nebulization 2 (two) times daily as needed for wheezing or shortness of breath.   amLODipine 5 MG tablet Commonly known as: NORVASC Take 5 mg by mouth daily.   aspirin  EC  325 MG tablet Take 1 tablet (325 mg total) by mouth daily.   atenolol 100 MG tablet Commonly known as: TENORMIN Take 100 mg by mouth at bedtime.   benazepril 40 MG tablet Commonly known as: LOTENSIN Take 40 mg by mouth in the morning and at bedtime.   buPROPion  150 MG 24 hr tablet Commonly known as: Wellbutrin  XL Take 1 tablet (150 mg total) by mouth daily.   calcitRIOL 0.25 MCG capsule Commonly known as:  ROCALTROL Take 0.25 mcg by mouth daily.   escitalopram  10 MG tablet Commonly known as: LEXAPRO  Take 10 mg by mouth at bedtime.   Farxiga  10 MG Tabs tablet Generic drug: dapagliflozin  propanediol Take 10 mg by mouth in the morning.   Fish Oil 1000 MG Caps Take 1,000 mg by mouth in the morning and at bedtime.   furosemide  40 MG tablet Commonly known as: LASIX  Take 1 tablet (40 mg total) by mouth daily as needed. What changed: reasons to take this   glipiZIDE 10 MG 24 hr tablet Commonly known as: GLUCOTROL XL Take 10 mg by mouth daily.   ipratropium 0.02 % nebulizer solution Commonly known as: ATROVENT  Take 2.5 mLs (0.5 mg total) by nebulization 2 (two) times daily as needed for wheezing or shortness of breath.   Kerendia  20 MG Tabs Generic drug: Finerenone  Take 20 mg by mouth in the morning.   magnesium oxide 400 (240 Mg) MG tablet Commonly known as: MAG-OX Take 1 tablet (400 mg total) by mouth daily for 14 days. Start taking on: Oct 15, 2023   mycophenolate  500 MG tablet Commonly known as: CELLCEPT  Take 500 mg by mouth in the morning and at bedtime.   Ozempic (2 MG/DOSE) 8 MG/3ML Sopn Generic drug: Semaglutide (2 MG/DOSE) Inject 2 mg into the skin once a week.   predniSONE  5 MG tablet Commonly known as: DELTASONE  Take 5 mg by mouth daily with breakfast.   rosuvastatin  40 MG tablet Commonly known as: CRESTOR  Take 1 tablet (40 mg total) by mouth at bedtime.   sodium bicarbonate  650 MG tablet Take 1,300 mg by mouth 3 (three) times daily.   tacrolimus  1 MG capsule Commonly known as: PROGRAF  Take 1 mg by mouth in the morning and at bedtime.   ticagrelor 90 MG Tabs tablet Commonly known as: BRILINTA Take 1 tablet (90 mg total) by mouth 2 (two) times daily for 28 days. Start taking on: Oct 15, 2023   Horace Lye FlexTouch 100 UNIT/ML FlexTouch Pen Generic drug: insulin  degludec Inject 13 Units into the skin at bedtime.   zolpidem  10 MG tablet Commonly known  as: AMBIEN  Take 10 mg by mouth at bedtime.        Follow-up Information     [facility]. Schedule an appointment as soon as possible for a visit.   Specialty: Rehabilitation Contact information: [address] [place]  [address] [phone]               Allergies  Allergen Reactions   Cefuroxime Swelling and Other (See Comments)    Ceftin:hypertension   Benadryl [Diphenhydramine Hcl] Swelling, Anxiety and Other (See Comments)    Hyperactivity and angioedema, also   Tape Rash and Other (See Comments)    Paper tape is okay   Wound Dressing Adhesive Rash and Other (See Comments)    Paper tape is ok    Consultations: Neurology   Procedures/Studies: ECHOCARDIOGRAM COMPLETE Result Date: 10/14/2023    ECHOCARDIOGRAM REPORT   Patient  Name:   ZULEMA PULASKI Date of Exam: 10/14/2023 Medical Rec #:  [id number]       Height:       62.5 in Accession #:    [id number]      Weight:       130.0 lb Date of Birth:  05/03/1966       BSA:          1.601 m Patient Age:    58 years        BP:           140/87 mmHg Patient Gender: F               HR:           98 bpm. Exam Location:  Inpatient Procedure: 2D Echo, Cardiac Doppler, Color Doppler and Saline Contrast Bubble            Study (Both Spectral and Color Flow Doppler were utilized during            procedure). Indications:    Stroke  History:        Patient has prior history of Echocardiogram examinations. Risk                 Factors:Current Smoker, Hypertension and Diabetes.  Sonographer:    Willey Harrier Referring Phys: [id number] ANASTASSIA DOUTOVA IMPRESSIONS  1. Left ventricular ejection fraction, by estimation, is 70 to 75%. The left ventricle has hyperdynamic function. The left ventricle has no regional wall motion abnormalities. There is moderate left ventricular hypertrophy. Left ventricular diastolic parameters are consistent with Grade I diastolic dysfunction (impaired relaxation).  2.  Right ventricular systolic function is normal. The right ventricular size is normal.  3. The mitral valve is normal in structure. Trivial mitral valve regurgitation. No evidence of mitral stenosis.  4. The aortic valve is tricuspid. Aortic valve regurgitation is trivial. No aortic stenosis is present.  5. The inferior vena cava is normal in size with greater than 50% respiratory variability, suggesting right atrial pressure of 3 mmHg.  6. Agitated saline contrast bubble study was negative, with no evidence of any interatrial shunt. FINDINGS  Left Ventricle: Left ventricular ejection fraction, by estimation, is 70 to 75%. The left ventricle has hyperdynamic function. The left ventricle has no regional wall motion abnormalities. The left ventricular internal cavity size was normal in size. There is moderate left ventricular hypertrophy. Left ventricular diastolic parameters are consistent with Grade I diastolic dysfunction (impaired relaxation). Right ventricular systolic function is normal. Left Atrium: Left atrial size was normal in size. Right Atrium: Right atrial size was normal in size. Pericardium: Trivial pericardial effusion is present. Mitral Valve: The mitral valve is normal in structure. Trivial mitral valve regurgitation. No evidence of mitral valve stenosis. MV peak gradient, 4.5 mmHg. The mean mitral valve gradient is 1.0 mmHg. Tricuspid Valve: The tricuspid valve is normal in structure. Tricuspid valve regurgitation is trivial. No evidence of tricuspid stenosis. Aortic Valve: The aortic valve is tricuspid. Aortic valve regurgitation is trivial. No aortic stenosis is present. Aortic valve peak gradient measures 15.4 mmHg. Pulmonic Valve: The pulmonic valve was normal in structure. Pulmonic valve regurgitation is trivial. No evidence of pulmonic stenosis. Aorta: The aortic root is normal in size and structure. Venous: The inferior vena cava is normal in size with greater than 50% respiratory variability,  suggesting right atrial pressure of 3 mmHg. IAS/Shunts: No atrial level shunt detected by color flow Doppler. Agitated saline contrast was given intravenously  to evaluate for intracardiac shunting. Agitated saline contrast bubble study was negative, with no evidence of any interatrial shunt.  LEFT VENTRICLE PLAX 2D LVIDd:         3.80 cm   Diastology LVIDs:         2.30 cm   LV e' medial:    5.87 cm/s LV PW:         1.20 cm   LV E/e' medial:  10.2 LV IVS:        1.50 cm   LV e' lateral:   7.29 cm/s LVOT diam:     1.90 cm   LV E/e' lateral: 8.2 LV SV:         86 LV SV Index:   54 LVOT Area:     2.84 cm  RIGHT VENTRICLE             IVC RV Basal diam:  2.60 cm     IVC diam: 0.70 cm RV S prime:     16.30 cm/s TAPSE (M-mode): 1.5 cm LEFT ATRIUM             Index        RIGHT ATRIUM          Index LA Vol (A2C):   28.9 ml 18.05 ml/m  RA Area:     6.85 cm LA Vol (A4C):   42.0 ml 26.23 ml/m  RA Volume:   10.70 ml 6.68 ml/m LA Biplane Vol: 35.3 ml 22.04 ml/m  AORTIC VALVE AV Area (Vmax): 2.43 cm AV Vmax:        196.00 cm/s AV Peak Grad:   15.4 mmHg LVOT Vmax:      168.00 cm/s LVOT Vmean:     112.000 cm/s LVOT VTI:       0.304 m  AORTA Ao Root diam: 2.80 cm Ao Asc diam:  2.60 cm MITRAL VALVE MV Area (PHT): 3.03 cm    SHUNTS MV Area VTI:   4.13 cm    Systemic VTI:  0.30 m MV Peak grad:  4.5 mmHg    Systemic Diam: 1.90 cm MV Mean grad:  1.0 mmHg MV Vmax:       1.06 m/s MV Vmean:      54.6 cm/s MV Decel Time: 250 msec MV E velocity: 59.70 cm/s MV A velocity: 94.30 cm/s MV E/A ratio:  0.63 Alexandria Angel MD Electronically signed by Alexandria Angel MD Signature Date/Time: 10/14/2023/2:43:44 PM    Final    DG Chest 2 View Result Date: 10/13/2023 CLINICAL DATA:  Left foot numbness and weakness. EXAM: CHEST - 2 VIEW COMPARISON:  Sep 22, 2023 FINDINGS: The heart size and mediastinal contours are within normal limits. Both lungs are clear. There is mild, stable levoscoliosis of the lower thoracic spine and upper lumbar spine.  The visualized skeletal structures are unremarkable. IMPRESSION: No active cardiopulmonary disease. Electronically Signed   By: Virgle Grime M.D.   On: 10/13/2023 21:12   MR BRAIN WO CONTRAST Result Date: 10/13/2023 CLINICAL DATA:  Neuro deficit, acute, stroke suspected. Left lower extremity numbness/weakness. EXAM: MRI HEAD WITHOUT CONTRAST TECHNIQUE: Multiplanar, multiecho pulse sequences of the brain and surrounding structures were obtained without intravenous contrast. COMPARISON:  Head CT and CTA 10/13/2023 and MRI 01/11/2023 FINDINGS: Brain: There is a 3 mm focus of mild diffusion weighted signal abnormality in the inferior right thalamus suspected to reflect an acute infarct (series 2, image 22 and series 3, image 16). A chronic infarct is noted in the  left corona radiata. Scattered small T2 hyperintensities elsewhere in the cerebral white matter bilaterally are similar to the prior MRI and are nonspecific but compatible with mild chronic small vessel ischemic disease. Cerebral volume is within normal limits for age. The ventricles are normal in size. No intracranial hemorrhage, mass, midline shift, or extra-axial fluid collection is identified. Vascular: Major intracranial vascular flow voids are preserved. Skull and upper cervical spine: Unremarkable bone marrow signal. Sinuses/Orbits: Unremarkable orbits. Paranasal sinuses and mastoid air cells are clear. Other: None. IMPRESSION: 1. Suspected acute lacunar infarct in the right thalamus. 2. Mild chronic small vessel ischemic disease. Electronically Signed   By: Aundra Lee M.D.   On: 10/13/2023 19:28   CT ANGIO HEAD NECK W WO CM Result Date: 10/13/2023 CLINICAL DATA:  Neuro deficit, concern for stroke, left foot numbness and weakness for 24 hours. EXAM: CT ANGIOGRAPHY HEAD AND NECK WITH AND WITHOUT CONTRAST TECHNIQUE: Multidetector CT imaging of the head and neck was performed using the standard protocol during bolus administration of  intravenous contrast. Multiplanar CT image reconstructions and MIPs were obtained to evaluate the vascular anatomy. Carotid stenosis measurements (when applicable) are obtained utilizing NASCET criteria, using the distal internal carotid diameter as the denominator. RADIATION DOSE REDUCTION: This exam was performed according to the departmental dose-optimization program which includes automated exposure control, adjustment of the mA and/or kV according to patient size and/or use of iterative reconstruction technique. CONTRAST:  60mL OMNIPAQUE IOHEXOL 350 MG/ML SOLN COMPARISON:  Same-day head CT.  MRA head 01/12/2023. FINDINGS: CTA NECK FINDINGS Aortic arch: Standard configuration of the aortic arch. Imaged portion shows no evidence of aneurysm or dissection. No significant stenosis of the major arch vessel origins. Pulmonary arteries: As permitted by contrast timing, there are no filling defects in the visualized pulmonary arteries. Subclavian arteries: The subclavian arteries are patent bilaterally. Right carotid system: No evidence of dissection, stenosis (50% or greater), or occlusion. Mild atherosclerosis along the proximal cervical ICA without stenosis. Left carotid system: No evidence of dissection, stenosis (50% or greater), or occlusion. Mild atherosclerosis at the carotid bifurcation without significant stenosis. Vertebral arteries: Codominant. No evidence of dissection, stenosis (50% or greater), or occlusion. Skeleton: No acute findings. Degenerative changes in the cervical spine. Disc space narrowing most pronounced at C5-6 and C6-7. Reversal of the normal cervical lordosis. Other neck: The visualized airway is patent. No cervical lymphadenopathy. Multiple thyroid nodules the largest measuring up to 1.8 cm. Upper chest: Visualized lung apices are clear. Review of the MIP images confirms the above findings CTA HEAD FINDINGS ANTERIOR CIRCULATION: The intracranial ICAs are patent bilaterally. Mild  atherosclerosis of the right cavernous ICA. No significant stenosis, proximal occlusion, aneurysm, or vascular malformation. MCAs: The middle cerebral arteries are patent bilaterally. ACAs: Patent bilaterally. Diminutive left A1 segment, likely congenital. POSTERIOR CIRCULATION: No significant stenosis, proximal occlusion, aneurysm, or vascular malformation. PCAs: The posterior cerebral arteries are patent bilaterally. Pcomm: Not well visualized. SCAs: The superior cerebellar arteries are patent bilaterally. Basilar artery: Patent AICAs: Patent PICAs: Patent Vertebral arteries: The intracranial vertebral arteries are patent. Venous sinuses: As permitted by contrast timing, patent. Anatomic variants: None Review of the MIP images confirms the above findings IMPRESSION: No large vessel occlusion. No high-grade stenosis, aneurysm, or dissection of the arteries in the head and neck. Mild scattered atherosclerosis as above. Multiple thyroid nodules measuring up to 1.8 cm. Recommend nonemergent correlation with thyroid ultrasound. Electronically Signed   By: Denny Flack M.D.   On: 10/13/2023 18:59  CT Head Wo Contrast Result Date: 10/13/2023 CLINICAL DATA:  Neuro deficit, acute, stroke suspected. Left foot/lower leg numbness/weakness. EXAM: CT HEAD WITHOUT CONTRAST TECHNIQUE: Contiguous axial images were obtained from the base of the skull through the vertex without intravenous contrast. RADIATION DOSE REDUCTION: This exam was performed according to the departmental dose-optimization program which includes automated exposure control, adjustment of the mA and/or kV according to patient size and/or use of iterative reconstruction technique. COMPARISON:  Head MRI 01/11/2023 FINDINGS: Brain: There is no evidence of an acute infarct, intracranial hemorrhage, mass, midline shift, or extra-axial fluid collection. Cerebral volume is normal. The ventricles are normal in size. A chronic infarct in the left corona radiata was  acute on the prior MRI. Vascular: Calcified atherosclerosis at the skull base. No hyperdense vessel. Skull: No fracture or suspicious lesion. Sinuses/Orbits: Visualized paranasal sinuses and mastoid air cells are clear. Unremarkable orbits. Other: None. IMPRESSION: 1. No evidence of acute intracranial abnormality. 2. Chronic left corona radiata infarct. Electronically Signed   By: Aundra Lee M.D.   On: 10/13/2023 15:21   (Echo, Carotid, EGD, Colonoscopy, ERCP)    Subjective: Seen in morning rounds.  Still has some numbness on the left foot but no other symptoms.  Discharged patient  later in the evening when she completed the stroke workup.   Discharge Exam: Vitals:   10/14/23 0534 10/14/23 0815  BP: (!) 140/91 (!) 140/87  Pulse: 98 68  Resp: 18 18  Temp: 98.2 F (36.8 C) 98.2 F (36.8 C)  SpO2: 96% 95%   Vitals:   10/13/23 2300 10/13/23 2345 10/14/23 0534 10/14/23 0815  BP: (!) 137/101 (!) 141/93 (!) 140/91 (!) 140/87  Pulse: 99 90 98 68  Resp:  18 18 18   Temp:  98.1 F (36.7 C) 98.2 F (36.8 C) 98.2 F (36.8 C)  TempSrc:  Oral Oral Oral  SpO2: 96% 97% 96% 95%  Weight:      Height:        General: Pt is alert, awake, not in acute distress Cardiovascular: RRR, S1/S2 +, no rubs, no gallops Respiratory: CTA bilaterally, no wheezing, no rhonchi Abdominal: Soft, NT, ND, bowel sounds + Extremities: no edema, no cyanosis Slight sensory loss on superficial sensation left foot.   The results of significant diagnostics from this hospitalization (including imaging, microbiology, ancillary and laboratory) are listed below for reference.     Microbiology: No results found for this or any previous visit (from the past 240 hours).   Labs: BNP (last 3 results) No results for input(s): "BNP" in the last 8760 hours. Basic Metabolic Panel: Recent Labs  Lab 10/13/23 1255 10/14/23 0648 10/14/23 0649  NA 137 138  --   K 4.0 3.7  --   CL 106 108  --   CO2 23 22  --   GLUCOSE  147* 74  --   BUN 26* 20  --   CREATININE 1.88* 1.77*  --   CALCIUM  9.1 8.9  --   MG  --   --  1.6*  PHOS  --   --  3.1   Liver Function Tests: Recent Labs  Lab 10/14/23 0648 10/14/23 0649  AST 23 22  ALT 29 29  ALKPHOS 87 89  BILITOT 0.8 0.5  PROT 5.4* 5.3*  ALBUMIN 2.9* 2.9*   No results for input(s): "LIPASE", "AMYLASE" in the last 168 hours. No results for input(s): "AMMONIA" in the last 168 hours. CBC: Recent Labs  Lab 10/13/23 1255 10/14/23 1610  WBC 13.0* 10.9*  HGB 13.9 13.0  HCT 43.3 41.0  MCV 90.4 90.3  PLT 205 191   Cardiac Enzymes: Recent Labs  Lab 10/14/23 0649  CKTOTAL 195   BNP: Invalid input(s): "POCBNP" CBG: Recent Labs  Lab 10/13/23 2107 10/14/23 0012 10/14/23 0531 10/14/23 0816 10/14/23 1111  GLUCAP 113* 193* 72 83 320*   D-Dimer No results for input(s): "DDIMER" in the last 72 hours. Hgb A1c Recent Labs    10/14/23 0648  HGBA1C 8.7*   Lipid Profile Recent Labs    10/14/23 0648  CHOL 100  HDL 28*  LDLCALC 32  TRIG 366*  CHOLHDL 3.6   Thyroid function studies Recent Labs    10/14/23 0649  TSH 1.678   Anemia work up No results for input(s): "VITAMINB12", "FOLATE", "FERRITIN", "TIBC", "IRON", "RETICCTPCT" in the last 72 hours. Urinalysis    Component Value Date/Time   COLORURINE STRAW (A) 10/14/2023 0150   APPEARANCEUR CLEAR 10/14/2023 0150   LABSPEC 1.017 10/14/2023 0150   PHURINE 7.0 10/14/2023 0150   GLUCOSEU >=500 (A) 10/14/2023 0150   HGBUR MODERATE (A) 10/14/2023 0150   BILIRUBINUR NEGATIVE 10/14/2023 0150   BILIRUBINUR Negative 01/25/2020 1354   KETONESUR NEGATIVE 10/14/2023 0150   PROTEINUR 100 (A) 10/14/2023 0150   UROBILINOGEN 0.2 01/25/2020 1354   UROBILINOGEN 0.2 12/20/2013 1220   NITRITE NEGATIVE 10/14/2023 0150   LEUKOCYTESUR NEGATIVE 10/14/2023 0150   Sepsis Labs Recent Labs  Lab 10/13/23 1255 10/14/23 0648  WBC 13.0* 10.9*   Microbiology No results found for this or any previous visit  (from the past 240 hours).   Time coordinating discharge: 32 minutes  SIGNED:   Vada Garibaldi, MD  [facility] 10/14/2023, 3:41 PM

## 2023-10-14 NOTE — Assessment & Plan Note (Signed)
 Continue home medications CellCept  500 mg p.o. twice daily prednisone  5 mg daily and Prograf  1 mg p.o. twice daily Renal function within baseline

## 2023-10-14 NOTE — Plan of Care (Signed)
   Problem: Coping: Goal: Ability to adjust to condition or change in health will improve Outcome: Progressing

## 2023-10-14 NOTE — Assessment & Plan Note (Signed)
Status post renal transplant 

## 2023-10-14 NOTE — Plan of Care (Signed)
  Problem: Education: Goal: Ability to describe self-care measures that may prevent or decrease complications (Diabetes Survival Skills Education) will improve Outcome: Progressing   Problem: Coping: Goal: Ability to adjust to condition or change in health will improve Outcome: Progressing   Problem: Fluid Volume: Goal: Ability to maintain a balanced intake and output will improve Outcome: Progressing   Problem: Health Behavior/Discharge Planning: Goal: Ability to identify and utilize available resources and services will improve Outcome: Progressing   Problem: Metabolic: Goal: Ability to maintain appropriate glucose levels will improve Outcome: Progressing   Problem: Nutritional: Goal: Maintenance of adequate nutrition will improve Outcome: Progressing Goal: Progress toward achieving an optimal weight will improve Outcome: Progressing   Problem: Tissue Perfusion: Goal: Adequacy of tissue perfusion will improve Outcome: Progressing   Problem: Education: Goal: Knowledge of General Education information will improve Description: Including pain rating scale, medication(s)/side effects and non-pharmacologic comfort measures Outcome: Progressing   Problem: Health Behavior/Discharge Planning: Goal: Ability to manage health-related needs will improve Outcome: Progressing   Problem: Coping: Goal: Level of anxiety will decrease Outcome: Progressing   Problem: Elimination: Goal: Will not experience complications related to bowel motility Outcome: Progressing Goal: Will not experience complications related to urinary retention Outcome: Progressing

## 2023-10-14 NOTE — Assessment & Plan Note (Signed)
Anusol cream

## 2023-10-14 NOTE — Assessment & Plan Note (Signed)
 New CVA.  - will admit based on TIA/CVA protocol,         Monitor on Tele        MRI  Resulted - showing acute ischemic CVA        CTA nonacute, no LVO       Echo to evaluate for possible embolic source,        obtain cardiac enzymes,  ECG,   Lipid panel, TSH.        Order PT/OT evaluation.        Patient passed swallow eval will restart diabetic diet      Will make sure patient is on antiplatelet ASA 81 ,  Plavix   and statin        Allow permissive Hypertension keep BP <220/120        Neurology consulted  will see

## 2023-10-14 NOTE — TOC Transition Note (Signed)
 Transition of Care [facility]) - Discharge Note   Patient Details  Name: Isabella Obrien MRN: [id number] Date of Birth: 22-Jun-1965  Transition of [facility]) CM/SW Contact:  Jonathan Neighbor, RN Phone Number: 10/14/2023, 2:41 PM   Clinical Narrative:     Pt is from home with her spouse and daughter. She and spouse work outside the home. No DME. She denies issues with home medications or transportation. Outpt rehab referral sent to [facility]. Information on the AVS for pt to call and schedule the first appointment. No new DME needs.  CM provided her with 30 day free and co pay cards for Brilinta. Pt has transport home.  Final next level of care: OP Rehab Barriers to Discharge: No Barriers Identified   Patient Goals and CMS Choice            Discharge Placement                       Discharge Plan and Services Additional resources added to the After Visit Summary for                                       Social Drivers of Health (SDOH) Interventions SDOH Screenings   Food Insecurity: No Food Insecurity (10/13/2023)  Housing: Low Risk  (10/13/2023)  Transportation Needs: No Transportation Needs (10/13/2023)  Utilities: Not At Risk (10/14/2023)  Depression (PHQ2-9): Low Risk  (11/03/2020)  Social Connections: Moderately Integrated (10/13/2023)  Tobacco Use: High Risk (10/13/2023)     Readmission Risk Interventions     No data to display

## 2023-10-14 NOTE — Assessment & Plan Note (Signed)
-  Spoke about importance of quitting spent 5 minutes discussing options for treatment, prior attempts at quitting, and dangers of smoking ? -At this point patient is    interested in quitting ? - order nicotine patch  ? - nursing tobacco cessation protocol ? ?

## 2023-10-14 NOTE — Evaluation (Signed)
 Physical Therapy Brief Evaluation and Discharge Note Patient Details Name: Isabella Obrien MRN: [id number] DOB: 31-Aug-1965 Today's Date: 10/14/2023   History of Present Illness  58 yo admitted with L leg weakness. MRI shows small acute right thalamic PMH: HTN, HLD, DM2, hx of kidney transplant, CVA.  Clinical Impression  Pt presents with admitting diagnosis above. Pt today was able to ambulate in hallway independently and navigate stairs. Pt also able to perform DGI scoring 22/24 indicating she is not a high fall risks. PTA pt reports that she is fully independent. Pt presents at or near baseline mobility. Pt has no further acute PT needs and will be signing off. Recommend OP Neuro PT upon DC. Re consult PT if mobility status changes.        PT Assessment All further PT needs can be met in the next venue of care  Assistance Needed at Discharge  PRN    Equipment Recommendations None recommended by PT  Recommendations for Other Services       Precautions/Restrictions Precautions Precautions: Fall Restrictions Weight Bearing Restrictions Per Provider Order: No        Mobility  Bed Mobility   Supine/Sidelying to sit: Independent Sit to supine/sidelying: Independent    Transfers Overall transfer level: Independent Equipment used: None                    Ambulation/Gait Ambulation/Gait assistance: Independent Gait Distance (Feet): 350 Feet Assistive device: None Gait Pattern/deviations: WFL(Within Functional Limits) (Increased hip flexion on L side to compensate for L foot weakness.) Gait Speed: Below normal General Gait Details: no LOB noted. Pt able to perform DGI. Pt reports she feels that she is ambulating slightly slower than her baseline.  Home Activity Instructions    Stairs Stairs: Yes Stairs assistance: Independent Stair Management: Two rails, Alternating pattern, Forwards Number of Stairs: 2 General stair comments: no LOB noted  Modified Rankin  (Stroke Patients Only) Modified Rankin (Stroke Patients Only) Pre-Morbid Rankin Score: No symptoms Modified Rankin: No symptoms      Balance     Sitting balance-Leahy Scale: Normal       Standing balance-Leahy Scale: Fair   Standardized Balance Assessment Standardized Balance Assessment : Dynamic Gait Index Dynamic Gait Index Level Surface: Mild Impairment Change in Gait Speed: Mild Impairment Gait with Horizontal Head Turns: Normal Gait with Vertical Head Turns: Normal Gait and Pivot Turn: Normal Step Over Obstacle: Normal Step Around Obstacles: Normal Steps: Normal Total Score: 22      Pertinent Vitals/Pain PT - Brief Vital Signs All Vital Signs Stable: Yes Pain Assessment Pain Assessment: No/denies pain     Home Living Family/patient expects to be discharged to:: Private residence Living Arrangements: Spouse/significant other Available Help at Discharge: Family;Available PRN/intermittently Home Environment: Stairs to enter;Rail - right;Rail - left  Stairs-Number of Steps: 6 Home Equipment: Hand held shower head        Prior Function Level of Independence: Independent 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   UE ROM/Strength/Tone/Coordination: [employer]    LE ROM/Strength/Tone/Coordination: Impaired LE ROM/Strength/Tone/Coordination Deficits: LLE numbness    Communication   Communication Communication: No apparent difficulties     Cognition Overall Cognitive Status: Appears within functional limits for tasks assessed/performed       General Comments General comments (skin integrity, edema, etc.): VSS    Exercises     Assessment/Plan    PT Problem List Decreased strength;Decreased range of motion;Decreased balance;Decreased activity tolerance;Decreased mobility;Decreased coordination;Decreased knowledge  of use of DME;Decreased safety awareness;Decreased knowledge of precautions;Cardiopulmonary status limiting activity        PT Visit Diagnosis Other abnormalities of gait and mobility (R26.89)    No Skilled PT     Co-evaluation                AMPAC 6 Clicks Help needed turning from your back to your side while in a flat bed without using bedrails?: None Help needed moving from lying on your back to sitting on the side of a flat bed without using bedrails?: None Help needed moving to and from a bed to a chair (including a wheelchair)?: None Help needed standing up from a chair using your arms (e.g., wheelchair or bedside chair)?: None Help needed to walk in hospital room?: None Help needed climbing 3-5 steps with a railing? : None 6 Click Score: 24      End of Session Equipment Utilized During Treatment: Gait belt Activity Tolerance: Patient tolerated treatment well Patient left: in bed;with call bell/phone within reach;with family/visitor present Nurse Communication: Mobility status PT Visit Diagnosis: Other abnormalities of gait and mobility (R26.89)     Time: 1005-1023 PT Time Calculation (min) (ACUTE ONLY): 18 min  Charges:   PT Evaluation $PT Eval Moderate Complexity: 1 Mod      Somya Jauregui B, PT, DPT Acute Rehab Services [phone]   Shakora Nordquist  10/14/2023, 2:47 PM

## 2023-10-14 NOTE — Assessment & Plan Note (Signed)
 Allow permissive hypertension for tonight

## 2023-10-14 NOTE — Progress Notes (Addendum)
 STROKE TEAM PROGRESS NOTE    GWENDLYN HANBACK is a 58 y.o. female with hx of diabetes, hypertension, hyperlipidemia, CKD status post kidney transplant, tobacco use who presented with a chief complaint of left leg weakness.  MRI shows small acute right thalamic stroke.   INTERIM HISTORY/SUBJECTIVE  Husband at bedside. On exam some slight left leg weakness and numbness.  Assessment, plan of care and risk factor modification thoroughly discussed with patient and husband.  OBJECTIVE  CBC    Component Value Date/Time   WBC 10.9 (H) 10/14/2023 0648   RBC 4.54 10/14/2023 0648   HGB 13.0 10/14/2023 0648   HCT 41.0 10/14/2023 0648   PLT 191 10/14/2023 0648   MCV 90.3 10/14/2023 0648   MCH 28.6 10/14/2023 0648   MCHC 31.7 10/14/2023 0648   RDW 13.4 10/14/2023 0648   LYMPHSABS 1.3 01/11/2023 1000   MONOABS 1.1 (H) 01/11/2023 1000   EOSABS 0.2 01/11/2023 1000   BASOSABS 0.0 01/11/2023 1000    BMET    Component Value Date/Time   NA 138 10/14/2023 0648   NA 137 05/26/2018 0000   K 3.7 10/14/2023 0648   CL 108 10/14/2023 0648   CO2 22 10/14/2023 0648   GLUCOSE 74 10/14/2023 0648   BUN 20 10/14/2023 0648   BUN 22 (A) 05/26/2018 0000   CREATININE 1.77 (H) 10/14/2023 0648   CREATININE 1.28 (H) 01/25/2020 1137   CALCIUM  8.9 10/14/2023 0648   GFRNONAA 33 (L) 10/14/2023 0648   GFRNONAA 48 (L) 01/25/2020 1137    IMAGING past 24 hours DG Chest 2 View Result Date: 10/13/2023 CLINICAL DATA:  Left foot numbness and weakness. EXAM: CHEST - 2 VIEW COMPARISON:  Sep 22, 2023 FINDINGS: The heart size and mediastinal contours are within normal limits. Both lungs are clear. There is mild, stable levoscoliosis of the lower thoracic spine and upper lumbar spine. The visualized skeletal structures are unremarkable. IMPRESSION: No active cardiopulmonary disease. Electronically Signed   By: Virgle Grime M.D.   On: 10/13/2023 21:12   MR BRAIN WO CONTRAST Result Date: 10/13/2023 CLINICAL DATA:   Neuro deficit, acute, stroke suspected. Left lower extremity numbness/weakness. EXAM: MRI HEAD WITHOUT CONTRAST TECHNIQUE: Multiplanar, multiecho pulse sequences of the brain and surrounding structures were obtained without intravenous contrast. COMPARISON:  Head CT and CTA 10/13/2023 and MRI 01/11/2023 FINDINGS: Brain: There is a 3 mm focus of mild diffusion weighted signal abnormality in the inferior right thalamus suspected to reflect an acute infarct (series 2, image 22 and series 3, image 16). A chronic infarct is noted in the left corona radiata. Scattered small T2 hyperintensities elsewhere in the cerebral white matter bilaterally are similar to the prior MRI and are nonspecific but compatible with mild chronic small vessel ischemic disease. Cerebral volume is within normal limits for age. The ventricles are normal in size. No intracranial hemorrhage, mass, midline shift, or extra-axial fluid collection is identified. Vascular: Major intracranial vascular flow voids are preserved. Skull and upper cervical spine: Unremarkable bone marrow signal. Sinuses/Orbits: Unremarkable orbits. Paranasal sinuses and mastoid air cells are clear. Other: None. IMPRESSION: 1. Suspected acute lacunar infarct in the right thalamus. 2. Mild chronic small vessel ischemic disease. Electronically Signed   By: Aundra Lee M.D.   On: 10/13/2023 19:28   CT ANGIO HEAD NECK W WO CM Result Date: 10/13/2023 CLINICAL DATA:  Neuro deficit, concern for stroke, left foot numbness and weakness for 24 hours. EXAM: CT ANGIOGRAPHY HEAD AND NECK WITH AND WITHOUT CONTRAST TECHNIQUE: Multidetector  CT imaging of the head and neck was performed using the standard protocol during bolus administration of intravenous contrast. Multiplanar CT image reconstructions and MIPs were obtained to evaluate the vascular anatomy. Carotid stenosis measurements (when applicable) are obtained utilizing NASCET criteria, using the distal internal carotid diameter as  the denominator. RADIATION DOSE REDUCTION: This exam was performed according to the departmental dose-optimization program which includes automated exposure control, adjustment of the mA and/or kV according to patient size and/or use of iterative reconstruction technique. CONTRAST:  60mL OMNIPAQUE IOHEXOL 350 MG/ML SOLN COMPARISON:  Same-day head CT.  MRA head 01/12/2023. FINDINGS: CTA NECK FINDINGS Aortic arch: Standard configuration of the aortic arch. Imaged portion shows no evidence of aneurysm or dissection. No significant stenosis of the major arch vessel origins. Pulmonary arteries: As permitted by contrast timing, there are no filling defects in the visualized pulmonary arteries. Subclavian arteries: The subclavian arteries are patent bilaterally. Right carotid system: No evidence of dissection, stenosis (50% or greater), or occlusion. Mild atherosclerosis along the proximal cervical ICA without stenosis. Left carotid system: No evidence of dissection, stenosis (50% or greater), or occlusion. Mild atherosclerosis at the carotid bifurcation without significant stenosis. Vertebral arteries: Codominant. No evidence of dissection, stenosis (50% or greater), or occlusion. Skeleton: No acute findings. Degenerative changes in the cervical spine. Disc space narrowing most pronounced at C5-6 and C6-7. Reversal of the normal cervical lordosis. Other neck: The visualized airway is patent. No cervical lymphadenopathy. Multiple thyroid nodules the largest measuring up to 1.8 cm. Upper chest: Visualized lung apices are clear. Review of the MIP images confirms the above findings CTA HEAD FINDINGS ANTERIOR CIRCULATION: The intracranial ICAs are patent bilaterally. Mild atherosclerosis of the right cavernous ICA. No significant stenosis, proximal occlusion, aneurysm, or vascular malformation. MCAs: The middle cerebral arteries are patent bilaterally. ACAs: Patent bilaterally. Diminutive left A1 segment, likely congenital.  POSTERIOR CIRCULATION: No significant stenosis, proximal occlusion, aneurysm, or vascular malformation. PCAs: The posterior cerebral arteries are patent bilaterally. Pcomm: Not well visualized. SCAs: The superior cerebellar arteries are patent bilaterally. Basilar artery: Patent AICAs: Patent PICAs: Patent Vertebral arteries: The intracranial vertebral arteries are patent. Venous sinuses: As permitted by contrast timing, patent. Anatomic variants: None Review of the MIP images confirms the above findings IMPRESSION: No large vessel occlusion. No high-grade stenosis, aneurysm, or dissection of the arteries in the head and neck. Mild scattered atherosclerosis as above. Multiple thyroid nodules measuring up to 1.8 cm. Recommend nonemergent correlation with thyroid ultrasound. Electronically Signed   By: Denny Flack M.D.   On: 10/13/2023 18:59   CT Head Wo Contrast Result Date: 10/13/2023 CLINICAL DATA:  Neuro deficit, acute, stroke suspected. Left foot/lower leg numbness/weakness. EXAM: CT HEAD WITHOUT CONTRAST TECHNIQUE: Contiguous axial images were obtained from the base of the skull through the vertex without intravenous contrast. RADIATION DOSE REDUCTION: This exam was performed according to the departmental dose-optimization program which includes automated exposure control, adjustment of the mA and/or kV according to patient size and/or use of iterative reconstruction technique. COMPARISON:  Head MRI 01/11/2023 FINDINGS: Brain: There is no evidence of an acute infarct, intracranial hemorrhage, mass, midline shift, or extra-axial fluid collection. Cerebral volume is normal. The ventricles are normal in size. A chronic infarct in the left corona radiata was acute on the prior MRI. Vascular: Calcified atherosclerosis at the skull base. No hyperdense vessel. Skull: No fracture or suspicious lesion. Sinuses/Orbits: Visualized paranasal sinuses and mastoid air cells are clear. Unremarkable orbits. Other: None.  IMPRESSION: 1. No evidence  of acute intracranial abnormality. 2. Chronic left corona radiata infarct. Electronically Signed   By: Aundra Lee M.D.   On: 10/13/2023 15:21    Vitals:   10/13/23 2300 10/13/23 2345 10/14/23 0534 10/14/23 0815  BP: (!) 137/101 (!) 141/93 (!) 140/91 (!) 140/87  Pulse: 99 90 98 68  Resp:  18 18 18   Temp:  98.1 F (36.7 C) 98.2 F (36.8 C) 98.2 F (36.8 C)  TempSrc:  Oral Oral Oral  SpO2: 96% 97% 96% 95%  Weight:      Height:        PHYSICAL EXAM General:  Alert, well-nourished, well-developed patient in no acute distress CV: Regular rate and rhythm on monitor Respiratory:  Regular, unlabored respirations on room air  NEURO:  Mental Status: AA&Ox3, patient is able to give clear and coherent history Speech/Language: speech is without dysarthria or aphasia.  Naming, repetition, fluency, and comprehension intact.  Cranial Nerves:  II: PERRL. Visual fields full.  III, IV, VI: EOMI. Eyelids elevate symmetrically.  V: Sensation is intact to light touch and symmetrical to face.  VII: Face is symmetrical resting and smiling VIII: hearing intact to voice. IX, X: Palate elevates symmetrically. Phonation is normal.  XB:[id number] shrug 5/5. XII: tongue is midline without fasciculations. Motor: Slight LLE weakness seen on exam, no drift.  Tone: is normal and bulk is normal Sensation-Decreased sensation to LLE  Coordination: FTN intact bilaterally, HKS: no ataxia in BLE.No drift.  Gait- deferred  Most Recent NIH: 1.    ASSESSMENT/PLAN  Ms. LUJUANA KAPLER is a 58 y.o. female with history of diabetes, hypertension, hyperlipidemia, CKD status post kidney transplant who presented with left leg weakness and numbness.  MRI showed right thalamic stroke, admitted for full stroke workup. NIH on Admission: 1.  Acute Ischemic Infarct:  right thalamic  Etiology: Small vessel disease and patient with multiple risk factors CT head  No acute abnormality. Chronic  left corona radiata infarct CTA head & neck  No large vessel occlusion No high-grade stenosis, aneurysm, dissection of arteries in head and neck, mild scattered atherosclerosis. Multiple thyroid nodules measuring up to 1.8 cm  MRI  Acute lacunar infarct right thalamus Mild chronic small vessel ischemic disease  2D Echo: PENDING LDL 32 HgbA1c 8.7 VTE prophylaxis - SCDs clopidogrel  75 mg daily prior to admission, now on aspirin  325 mg daily and Brilinta (ticagrelor) 90 mg bid for 4 weeks and then Aspirin  alone. Therapy recommendations:  Pending Disposition:  pending  Hypertension Home meds: Amlodipine 5 mg daily, atenolol 100 mg daily Stable SBP goal - permissive hypertension first 24 h < 220/110. Held home meds.   Hyperlipidemia Home meds: Crestor  40 mg, resumed in hospital LDL 32, goal < 70 Continue statin at discharge  Diabetes type II Uncontrolled Home meds: Glipizide 10 mg daily, Ozempic, Tresiba HgbA1c 8.7, goal < 7.0 CBGs SSI Recommend close follow-up with PCP for better DM control  Tobacco Abuse Former cigarette smoker, quit 2024  Other Stroke Risk Factors Family hx stroke (paternal grandmother)  Other Active Problems CKD status post kidney transplant Currently on immunosuppressants  Hospital day # 1   Pt seen by Neuro NP/APP and later by MD. Note/plan to be edited by MD as needed.    Audrene Lease, DNP, AGACNP-BC [facility] Please use AMION for contact information & EPIC for messaging.   I have personally obtained history,examined this patient, reviewed notes, independently viewed imaging studies, participated in medical decision making and plan of care.ROS completed  by me personally and pertinent positives fully documented  I have made any additions or clarifications directly to the above note. Agree with note above.  Patient presented with several days history of left leg weakness and MRI scan confirmed small acute right thalamic lacunar  infarct likely from small vessel disease.  Recommend dual antiplatelet therapy aspirin  and Brilinta for 4 weeks followed by aspirin  alone and continue ongoing stroke workup and aggressive risk factor modification.  Mobilize out of bed.  Therapy consults.  Greater than 50% time during this 50-minute visit was spent in counseling and coordination of care and discussion with patient and care team and answering questions.  Ardella Beaver, MD Medical Director [facility] Pager: [phone] 10/14/2023 2:18 PM   To contact Stroke Continuity provider, please refer to [email or web address]. After hours, contact General Neurology

## 2023-10-14 NOTE — Assessment & Plan Note (Signed)
-  chronic avoid nephrotoxic medications such as NSAIDs, Vanco Zosyn combo,  avoid hypotension, continue to follow renal function

## 2023-10-14 NOTE — Assessment & Plan Note (Signed)
Continue Crestor 40 mg a day 

## 2023-10-14 NOTE — TOC Benefit Eligibility Note (Signed)
 Pharmacy Patient Advocate Encounter  Insurance verification completed.    The patient is insured through [employer] [facility]. Patient has [employer], may use a copay card, and/or apply for patient assistance if available.    Ran test claim for Brilinta 90mg  and the current 30 day co-pay is $30.   This test claim was processed through [facility]- copay amounts may vary at other pharmacies due to [employer], or as the patient moves through the different stages of their insurance plan.

## 2023-10-14 NOTE — Assessment & Plan Note (Signed)
-   Order Sensitive  SSI   - continue home insulin but decreased to  5 units,  -  check TSH and HgA1C  - Hold by mouth medications   

## 2023-10-14 NOTE — Progress Notes (Signed)
 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  In the emergency room she was found to be slightly weak on the left lower extremity.  MRI consistent with right thalamic stroke.  Admitted with neurology consultation.  Subjective: Patient seen and examined.  No overnight events.  Husband at the bedside.  Patient tells me that her mobility is better and she is able to state better today.  Echocardiogram pending.  She is compliant on Plavix  as per her.  Assessment & Plan:   Right MCA stroke: Clinical findings, left lower extremity weakness. CT head findings, no acute findings.  No bleeding. MRI of the brain, acute lacunar infarct right thalamus.  Mild chronic small vessel disease. CTA head and neck, no large vessel occlusion.  No high-grade stenosis or dissection.  Multiple thyroid nodules. 2D echocardiogram, pending. Antiplatelet therapy, supposed to be on Plavix  at home.  Now on aspirin  and Plavix . LDL, pending.  On Crestor  40 mg daily. Hemoglobin A1c, pending.  Currently on sliding scale insulin .  At home on glipizide and Tresiba. Therapy recommendations, pending..  Likely outpatient therapies. Neurology, PT/OT.  Essential hypertension: On amlodipine, atenolol, benazepril, Lasix  as needed.  Blood pressure stable.  Antihypertensives on hold for permissive hypertension.  Type 2 diabetes: Well-controlled as per patient.  A1c pending.  As above on sliding scale insulin .  Sleep apnea: On nocturnal CPAP.  CKD stage IIIb, history of kidney transplant: Creatinine at about baseline.  She is on Prograf , prednisone  and CellCept  that is continued.    DVT prophylaxis: SCD's  Start: 10/13/23 2345   Code Status: Full code Family Communication: Husband at bedside Disposition Plan: Status is: Inpatient Remains inpatient appropriate because: Stroke workup     Consultants:  Neurology  Procedures:  None  Antimicrobials:  None     Objective: Vitals:   10/13/23 2249 10/13/23 2300 10/13/23 2345 10/14/23 0534  BP: (!) 144/88 (!) 137/101 (!) 141/93 (!) 140/91  Pulse: 71 99 90 98  Resp: 20  18 18   Temp: 98.1 F (36.7 C)  98.1 F (36.7 C) 98.2 F (36.8 C)  TempSrc: Oral  Oral Oral  SpO2: 98% 96% 97% 96%  Weight:      Height:        Intake/Output Summary (Last 24 hours) at 10/14/2023 0813 Last data filed at 10/14/2023 0552 Gross per 24 hour  Intake 516.65 ml  Output --  Net 516.65 ml   Filed Weights   10/13/23 1207  Weight: 59 kg    Examination:  General exam: Appears calm and comfortable  Respiratory system: Clear to auscultation. Respiratory effort normal. Cardiovascular system: S1 & S2 heard, RRR. No JVD, murmurs, rubs, gallops or clicks. No pedal edema. Gastrointestinal system: Abdomen is nondistended, soft and nontender. No organomegaly or masses felt. Normal bowel sounds heard. Central nervous system: Alert and oriented. No focal neurological deficits. Extremities: Symmetric 5 x 5 power. She has slightly decreased sensation on the left lower leg.    Data Reviewed: I have personally reviewed following labs and imaging studies  CBC: Recent Labs  Lab 10/13/23 1255  WBC 13.0*  HGB 13.9  HCT 43.3  MCV 90.4  PLT 205   Basic Metabolic Panel: Recent  Labs  Lab 10/13/23 1255  NA 137  K 4.0  CL 106  CO2 23  GLUCOSE 147*  BUN 26*  CREATININE 1.88*  CALCIUM  9.1   GFR: Estimated Creatinine Clearance: 27.1 mL/min (A) (by C-G formula based on SCr of 1.88 mg/dL (H)). Liver Function Tests: No results for input(s): "AST", "ALT", "ALKPHOS", "BILITOT", "PROT", "ALBUMIN" in the last 168 hours. No results for input(s): "LIPASE",  "AMYLASE" in the last 168 hours. No results for input(s): "AMMONIA" in the last 168 hours. Coagulation Profile: No results for input(s): "INR", "PROTIME" in the last 168 hours. Cardiac Enzymes: No results for input(s): "CKTOTAL", "CKMB", "CKMBINDEX", "TROPONINI" in the last 168 hours. BNP (last 3 results) No results for input(s): "PROBNP" in the last 8760 hours. HbA1C: No results for input(s): "HGBA1C" in the last 72 hours. CBG: Recent Labs  Lab 10/13/23 2107 10/14/23 0012 10/14/23 0531  GLUCAP 113* 193* 72   Lipid Profile: No results for input(s): "CHOL", "HDL", "LDLCALC", "TRIG", "CHOLHDL", "LDLDIRECT" in the last 72 hours. Thyroid Function Tests: No results for input(s): "TSH", "T4TOTAL", "FREET4", "T3FREE", "THYROIDAB" in the last 72 hours. Anemia Panel: No results for input(s): "VITAMINB12", "FOLATE", "FERRITIN", "TIBC", "IRON", "RETICCTPCT" in the last 72 hours. Sepsis Labs: No results for input(s): "PROCALCITON", "LATICACIDVEN" in the last 168 hours.  No results found for this or any previous visit (from the past 240 hours).       Radiology Studies: DG Chest 2 View Result Date: 10/13/2023 CLINICAL DATA:  Left foot numbness and weakness. EXAM: CHEST - 2 VIEW COMPARISON:  Sep 22, 2023 FINDINGS: The heart size and mediastinal contours are within normal limits. Both lungs are clear. There is mild, stable levoscoliosis of the lower thoracic spine and upper lumbar spine. The visualized skeletal structures are unremarkable. IMPRESSION: No active cardiopulmonary disease. Electronically Signed   By: Virgle Grime M.D.   On: 10/13/2023 21:12   MR BRAIN WO CONTRAST Result Date: 10/13/2023 CLINICAL DATA:  Neuro deficit, acute, stroke suspected. Left lower extremity numbness/weakness. EXAM: MRI HEAD WITHOUT CONTRAST TECHNIQUE: Multiplanar, multiecho pulse sequences of the brain and surrounding structures were obtained without intravenous contrast. COMPARISON:  Head CT and CTA  10/13/2023 and MRI 01/11/2023 FINDINGS: Brain: There is a 3 mm focus of mild diffusion weighted signal abnormality in the inferior right thalamus suspected to reflect an acute infarct (series 2, image 22 and series 3, image 16). A chronic infarct is noted in the left corona radiata. Scattered small T2 hyperintensities elsewhere in the cerebral white matter bilaterally are similar to the prior MRI and are nonspecific but compatible with mild chronic small vessel ischemic disease. Cerebral volume is within normal limits for age. The ventricles are normal in size. No intracranial hemorrhage, mass, midline shift, or extra-axial fluid collection is identified. Vascular: Major intracranial vascular flow voids are preserved. Skull and upper cervical spine: Unremarkable bone marrow signal. Sinuses/Orbits: Unremarkable orbits. Paranasal sinuses and mastoid air cells are clear. Other: None. IMPRESSION: 1. Suspected acute lacunar infarct in the right thalamus. 2. Mild chronic small vessel ischemic disease. Electronically Signed   By: Aundra Lee M.D.   On: 10/13/2023 19:28   CT ANGIO HEAD NECK W WO CM Result Date: 10/13/2023 CLINICAL DATA:  Neuro deficit, concern for stroke, left foot numbness and weakness for 24 hours. EXAM: CT ANGIOGRAPHY HEAD AND NECK WITH AND WITHOUT CONTRAST TECHNIQUE: Multidetector CT imaging of the head and neck was performed using the standard protocol during bolus administration of intravenous contrast. Multiplanar CT image  reconstructions and MIPs were obtained to evaluate the vascular anatomy. Carotid stenosis measurements (when applicable) are obtained utilizing NASCET criteria, using the distal internal carotid diameter as the denominator. RADIATION DOSE REDUCTION: This exam was performed according to the departmental dose-optimization program which includes automated exposure control, adjustment of the mA and/or kV according to patient size and/or use of iterative reconstruction technique.  CONTRAST:  60mL OMNIPAQUE IOHEXOL 350 MG/ML SOLN COMPARISON:  Same-day head CT.  MRA head 01/12/2023. FINDINGS: CTA NECK FINDINGS Aortic arch: Standard configuration of the aortic arch. Imaged portion shows no evidence of aneurysm or dissection. No significant stenosis of the major arch vessel origins. Pulmonary arteries: As permitted by contrast timing, there are no filling defects in the visualized pulmonary arteries. Subclavian arteries: The subclavian arteries are patent bilaterally. Right carotid system: No evidence of dissection, stenosis (50% or greater), or occlusion. Mild atherosclerosis along the proximal cervical ICA without stenosis. Left carotid system: No evidence of dissection, stenosis (50% or greater), or occlusion. Mild atherosclerosis at the carotid bifurcation without significant stenosis. Vertebral arteries: Codominant. No evidence of dissection, stenosis (50% or greater), or occlusion. Skeleton: No acute findings. Degenerative changes in the cervical spine. Disc space narrowing most pronounced at C5-6 and C6-7. Reversal of the normal cervical lordosis. Other neck: The visualized airway is patent. No cervical lymphadenopathy. Multiple thyroid nodules the largest measuring up to 1.8 cm. Upper chest: Visualized lung apices are clear. Review of the MIP images confirms the above findings CTA HEAD FINDINGS ANTERIOR CIRCULATION: The intracranial ICAs are patent bilaterally. Mild atherosclerosis of the right cavernous ICA. No significant stenosis, proximal occlusion, aneurysm, or vascular malformation. MCAs: The middle cerebral arteries are patent bilaterally. ACAs: Patent bilaterally. Diminutive left A1 segment, likely congenital. POSTERIOR CIRCULATION: No significant stenosis, proximal occlusion, aneurysm, or vascular malformation. PCAs: The posterior cerebral arteries are patent bilaterally. Pcomm: Not well visualized. SCAs: The superior cerebellar arteries are patent bilaterally. Basilar artery:  Patent AICAs: Patent PICAs: Patent Vertebral arteries: The intracranial vertebral arteries are patent. Venous sinuses: As permitted by contrast timing, patent. Anatomic variants: None Review of the MIP images confirms the above findings IMPRESSION: No large vessel occlusion. No high-grade stenosis, aneurysm, or dissection of the arteries in the head and neck. Mild scattered atherosclerosis as above. Multiple thyroid nodules measuring up to 1.8 cm. Recommend nonemergent correlation with thyroid ultrasound. Electronically Signed   By: Denny Flack M.D.   On: 10/13/2023 18:59   CT Head Wo Contrast Result Date: 10/13/2023 CLINICAL DATA:  Neuro deficit, acute, stroke suspected. Left foot/lower leg numbness/weakness. EXAM: CT HEAD WITHOUT CONTRAST TECHNIQUE: Contiguous axial images were obtained from the base of the skull through the vertex without intravenous contrast. RADIATION DOSE REDUCTION: This exam was performed according to the departmental dose-optimization program which includes automated exposure control, adjustment of the mA and/or kV according to patient size and/or use of iterative reconstruction technique. COMPARISON:  Head MRI 01/11/2023 FINDINGS: Brain: There is no evidence of an acute infarct, intracranial hemorrhage, mass, midline shift, or extra-axial fluid collection. Cerebral volume is normal. The ventricles are normal in size. A chronic infarct in the left corona radiata was acute on the prior MRI. Vascular: Calcified atherosclerosis at the skull base. No hyperdense vessel. Skull: No fracture or suspicious lesion. Sinuses/Orbits: Visualized paranasal sinuses and mastoid air cells are clear. Unremarkable orbits. Other: None. IMPRESSION: 1. No evidence of acute intracranial abnormality. 2. Chronic left corona radiata infarct. Electronically Signed   By: Aundra Lee M.D.   On: 10/13/2023  15:21        Scheduled Meds:   stroke: early stages of recovery book   Does not apply Once   aspirin   EC  81 mg Oral Daily   buPROPion   150 mg Oral Daily   calcitRIOL  0.25 mcg Oral Daily   clopidogrel   75 mg Oral Daily   hydrocortisone   Rectal BID   insulin  aspart  0-9 Units Subcutaneous Q4H   insulin  glargine-yfgn  5 Units Subcutaneous Q2200   mycophenolate   500 mg Oral BID   nicotine  7 mg Transdermal Daily   predniSONE   5 mg Oral Q breakfast   rosuvastatin   40 mg Oral QHS   sodium bicarbonate   1,300 mg Oral TID   tacrolimus   1 mg Oral BID   Continuous Infusions:  sodium chloride  75 mL/hr at 10/14/23 0018     LOS: 1 day    Time spent: 50 minutes    Vada Garibaldi, MD [facility]

## 2023-10-18 NOTE — Telephone Encounter (Signed)
 She can be offered Dr. Jodean next available [place] date which is June 20.

## 2023-10-18 NOTE — Telephone Encounter (Signed)
 Patient called this morning and would like to speak to someone about rescheduling her surgery on 10/28/23.  Please call her to advise.  Thank you.

## 2023-10-20 ENCOUNTER — Ambulatory Visit: Attending: Internal Medicine | Admitting: Physical Therapy

## 2023-10-20 DIAGNOSIS — I69352 Hemiplegia and hemiparesis following cerebral infarction affecting left dominant side: Secondary | ICD-10-CM | POA: Diagnosis present

## 2023-10-20 DIAGNOSIS — I639 Cerebral infarction, unspecified: Secondary | ICD-10-CM | POA: Diagnosis not present

## 2023-10-20 NOTE — Therapy (Unsigned)
 OUTPATIENT PHYSICAL THERAPY NEURO EVALUATION   Patient Name: Isabella Obrien MRN: [id number] DOB:1966-05-06, 58 y.o., female Today's Date: 10/21/2023   PCP: Dorena Gander, MD REFERRING PROVIDER: Vada Garibaldi, MD  END OF SESSION:  PT End of Session - 10/21/23 1223     Visit Number 1    Number of Visits 1    Authorization Type [employer]    PT Start Time 1150    PT Stop Time 1237    PT Time Calculation (min) 47 min    Activity Tolerance Patient tolerated treatment well    Behavior During Therapy WFL for tasks assessed/performed             Past Medical History:  Diagnosis Date   Chronic kidney disease    has kidney transplant - having aphoresis now every other week   Diabetes mellitus without complication (HCC)    dm type 2 - fasting 150   History of blood product transfusion    Hyperlipidemia    Hypertension    Menopausal hot flushes 09/06/2017   Controlled on Lexapro  by Dr. Asencion Blacksmith   Neuromuscular disorder [facility])    Past Surgical History:  Procedure Laterality Date   COLONOSCOPY     diateck catheter placement  09/2013   KIDNEY TRANSPLANT     ORIF ANKLE FRACTURE Left 2014   TEE WITHOUT CARDIOVERSION N/A 01/13/2023   Procedure: TRANSESOPHAGEAL ECHOCARDIOGRAM;  Surgeon: Jann Melody, MD;  Location: [facility] INVASIVE CV LAB;  Service: Cardiovascular;  Laterality: N/A;   Patient Active Problem List   Diagnosis Date Noted   DM2 (diabetes mellitus, type 2) (HCC) 10/14/2023   Acute ischemic stroke (HCC) 10/13/2023   Severe obstructive sleep apnea-hypopnea syndrome 08/10/2023   Tobacco abuse 08/10/2023   OSA on CPAP 08/10/2023   Pain in left hand 02/25/2023   Mallet finger of left hand 02/25/2023   Ischemic stroke of frontal lobe (HCC) 01/11/2023   Insomnia 02/02/2020   Internal hemorrhoids 12/20/2017   Menopausal hot flushes 09/06/2017   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   Combined hyperlipidemia associated  with type 2 diabetes mellitus (HCC) 07/18/2013   Proteinuria 07/18/2013   Sciatica of right side 07/18/2013   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   CKD (chronic kidney disease) 07/18/2013   Hypertension due to kidney transplant 06/10/2011   History of living-donor kidney transplantation 04/19/2011   Long-term use of immunosuppressant medication 04/19/2011    ONSET DATE: 10-13-23  REFERRING DIAG:  Diagnosis  I63.9 (ICD-10-CM) - Acute ischemic stroke (HCC)    THERAPY DIAG:  Hemiplegia and hemiparesis following cerebral infarction affecting left dominant side (HCC) - Plan: PT plan of care cert/re-cert  Rationale for Evaluation and Treatment: Rehabilitation  SUBJECTIVE:  SUBJECTIVE STATEMENT: Pt [facility] on 10-13-23 with acute ischemic CVA.  Pt was discharged home on 10-14-23.  Pt reports her Lt foot is a little numb but states she has improved significantly since onset on 10-13-23.  Pt is independent with all mobility and ADL's - states she drove herself to today's PT eval appt.  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   Pt accompanied by: self  PERTINENT HISTORY: Hypertension due to kidney transplant   History of living-donor kidney transplantation   Combined hyperlipidemia associated with type 2 diabetes mellitus (HCC)   FSGS (focal segmental glomerulosclerosis)   CKD (chronic kidney disease)   Internal hemorrhoids   Tobacco abuse   OSA on CPAP   DM2 (diabetes mellitus, type 2) (HCC)  PAIN:  Are you having pain? No  PRECAUTIONS: None  RED FLAGS: None   WEIGHT BEARING RESTRICTIONS: No  FALLS: Has patient fallen in last 6 months? No  LIVING ENVIRONMENT: Lives with: lives with their spouse Lives in:  House/apartment Stairs: Yes: External: 4-5 steps; rail present but is in process of having a deck built Has following equipment at home: None  PLOF: Independent  PATIENT GOALS: "to walk normal"  OBJECTIVE:  Note: Objective measures were completed at Evaluation unless otherwise noted.  DIAGNOSTIC FINDINGS: Right MCA stroke: Clinical findings, left lower extremity weakness.  Mostly resolved. CT head findings, no acute findings.  No bleeding. MRI of the brain, acute lacunar infarct right thalamus.  Mild chronic small vessel disease. CTA head and neck, no large vessel occlusion.  No high-grade stenosis or dissection.  Multiple thyroid nodules.  COGNITION: Overall cognitive status: Within functional limits for tasks assessed   SENSATION: Light touch: Impaired  - Lt foot  COORDINATION: WNL's LLE  POSTURE: No Significant postural limitations  LOWER EXTREMITY ROM:   WNL's LLE  LOWER EXTREMITY MMT:    MMT Right Eval Left Eval  Hip flexion  4 (pt reports weakness from previous injury)  Hip extension    Hip abduction    Hip adduction    Hip internal rotation    Hip external rotation    Knee flexion  5  Knee extension  5  Ankle dorsiflexion  3-/ 3+   Ankle plantarflexion  4  Ankle inversion    Ankle eversion    (Blank rows = not tested)  BED MOBILITY:  Findings: Independent  TRANSFERS: Sit to stand: Complete Independence  Assistive device utilized: None      GAIT: Findings: Gait Characteristics: WFL, Distance walked: 100', Assistive device utilized:None, Level of assistance: Modified independence, and Comments: decreased dorsiflexion Lt foot but no foot drop exhibited  FUNCTIONAL TESTS:  Timed up and go (TUG): 11.56 secs without device 10 meter walk test: 9.25 secs = 3.55 ft/sec without device  TREATMENT DATE: 10-20-23  HEP:   Access Code:  [facility] URL: [email or web address] Date: 10/21/2023 Prepared by: Johnnette Nakayama  Exercises - Seated Ankle Dorsiflexion with Resistance  - 1 x daily - 7 x weekly - 3 sets - 10 reps - 3-5 sec  hold - Seated Ankle Plantar Flexion with Resistance Loop  - 1 x daily - 7 x weekly - 3 sets - 10 reps - 5 sec hold - Single Leg Heel Raise with Chair Support  - 1 x daily - 7 x weekly - 3 sets - 10 reps - 3-5 sec hold - Toe Walking with Counter Support  - 1 x daily - 7 x weekly - 3 sets - 10 reps - Heel Walking  - 1 x daily - 7 x weekly - 3 sets - 10 reps   PATIENT EDUCATION: Education details: HEP - see above - red & green theraband issued to pt for HEP Person educated: Patient Education method: Explanation, Demonstration, and Handouts Education comprehension: verbalized understanding and returned demonstration  HOME EXERCISE PROGRAM: [employer] - see above  GOALS: N/A - pt requests no follow up - states she will do HEP and call for appt if needed. Pt reports she has made significant improvements since CVA onset 1 week ago.   SHORT TERM GOALS: N/A    LONG TERM GOALS: N/A - eval only per pt's request    ASSESSMENT:  CLINICAL IMPRESSION: Patient is a 58 y.o. lady who was seen today for physical therapy evaluation and treatment for LLE weakness due to ischemic CVA sustained on 10-13-23.  Pt presents with c/o numbness in Lt foot and toes and decreased strength in Lt dorsiflexors and plantarflexors (3- (3+/5 in available range), 4/5 respectively).  Pt is not at fall risk per TUG score of 11.56 secs.  Pt is independent with ambulation without use of assistive device.  Pt requests no follow up appt at this time -states she will do HEP as instructed and will call for appt if needed.   OBJECTIVE IMPAIRMENTS: Abnormal gait.   ACTIVITY LIMITATIONS: locomotion level  PARTICIPATION LIMITATIONS: N/A - pt reports she plans to return to work on 10-24-23  PERSONAL FACTORS: Past/current  experiences and 1 comorbidity: h/o CVA I Aug. 2024 are also affecting patient's functional outcome.   REHAB POTENTIAL: Excellent  CLINICAL DECISION MAKING: Evolving/moderate complexity  EVALUATION COMPLEXITY: Moderate  PLAN:  PT FREQUENCY: one time visit -- per pt's request   PT DURATION: 1 week  PLANNED INTERVENTIONS: 97110-Therapeutic exercises, 97530- Therapeutic activity, 97112- Neuromuscular re-education, and 16109- Self Care  PLAN FOR NEXT SESSION: N/A - eval only per pt's request   Viola Kinnick Suzanne, PT 10/21/2023, 12:51 PM

## 2023-10-24 NOTE — Progress Notes (Signed)
 Location Information: Patient State (at time of visit): [place]  Patient Location (at time of visit):Home/Other Non-Medical  Provider Location: Hospital/Provider-Based Clinic Is provider licensed to provide clinical care in the current location/state of the patient? Yes   Consent:  Patient's identity was confirmed. Presenting condition or illness was discussed with the patient/personal representative. Current proposed treatment for presenting condition or illness was explained to patient/personal representative along with the likely benefits and any significant risks or complications associated with the provision of treatment by audio/video means. The patient/personal representative verbally authorized treatment to be provided by audio/video, which may include a limited review of patient's current health status, medication, or other treatment recommendations, patient education, and an opportunity to ask questions about condition and treatment. Verbal Consent Granted by Patient/Personal Representative:Yes   Visit Information: Modality: Audio-Only  Time Spent on Phone w/ Patient: 7 MINUTES   Televisit Follow-up Note  Isabella Obrien is on the phone today for a follow up visit regarding   History of Present Illness The patient presents via virtual visit for preoperative evaluation. She continues to be bothered by her hemorrhoid tissue and desires traditional excision.   She is scheduled for a hemorrhoidectomy on 11/11/2023, to be performed by Dr. Merrilyn. She has expressed her readiness for the procedure, stating that the condition has persisted long enough.  SOCIAL HISTORY Occupations: Currently employed      ROS:  A complete review of systems was otherwise negative, except as noted in the HPI.  No Physical Exam able to be performed.   ASSESSMENT: 1. Hemorrhoidal skin tags         Isabella Obrien is a 58 y.o. year old female with hemorrhoidal skin tags.  PLAN:    Assessment & Plan 1. Preoperative evaluation for hemorrhoidectomy. A comprehensive discussion was held regarding the potential risks associated with the upcoming hemorrhoidectomy, including postoperative bleeding, pain, and altered bowel habits. Pain management will include prescribed pain medication, sitz baths, ice packs, and heat application. To prevent constipation, the use of MiraLAX  or Metamucil was recommended. The possibility of recurrence due to factors such as overstraining, constipation, and prolonged sitting was also addressed. Instructions were given to administer 2 Fleet enemas 30 minutes prior to leaving home on the day of surgery. Dietary restrictions include abstaining from solid food after midnight before the surgery and consuming only clear liquids up to 2 hours before the procedure. Clear liquids were defined as Jell-O, popsicles, Sprite, water, black coffee, apple juice, and Gatorade. Postoperative care instructions include using pads for a few days to manage bleeding, resuming a normal diet after discharge, and arranging for a driver on the day of surgery. Anesthesia will review medications on the day of surgery. A postoperative visit will be scheduled approximately 4 weeks after the surgery. Consent signed today.  2. Recent stroke. The patient reported experiencing a minor stroke on 10/20/2023 and is currently on blood thinners. She was advised to stop these medications 2 days before the surgery. She will follow up with the specific medication names to ensure proper management.  I have personally spent 29 minutes involved in non-face-to-face activities for this patient on the day of the visit.  Professional time spent includes the following activities, in addition to those noted in the documentation: review of medications, previous clinic notes and exams, labs.  Electronically signed by: Augustin Eyvonne Monte, FNP 10/24/2023 10:24 AM

## 2023-11-11 NOTE — H&P (Signed)
 Isabella Obrien has had two neurological events recently and it was determined that it would be safest to postpone the procedure for 3 months. We will reschedule accordingly.

## 2023-11-29 ENCOUNTER — Inpatient Hospital Stay (HOSPITAL_COMMUNITY)

## 2023-11-29 ENCOUNTER — Encounter (HOSPITAL_COMMUNITY): Payer: Self-pay

## 2023-11-29 ENCOUNTER — Other Ambulatory Visit: Payer: Self-pay

## 2023-11-29 ENCOUNTER — Emergency Department (HOSPITAL_COMMUNITY)

## 2023-11-29 ENCOUNTER — Inpatient Hospital Stay (HOSPITAL_COMMUNITY)
Admission: EM | Admit: 2023-11-29 | Discharge: 2023-12-04 | DRG: 698 | Disposition: A | Discharge status: Home / Self Care | Source: Ambulatory Visit | Attending: Internal Medicine | Admitting: Internal Medicine

## 2023-11-29 DIAGNOSIS — Z7902 Long term (current) use of antithrombotics/antiplatelets: Secondary | ICD-10-CM

## 2023-11-29 DIAGNOSIS — D259 Leiomyoma of uterus, unspecified: Secondary | ICD-10-CM | POA: Diagnosis present

## 2023-11-29 DIAGNOSIS — N183 Chronic kidney disease, stage 3 unspecified: Secondary | ICD-10-CM | POA: Diagnosis present

## 2023-11-29 DIAGNOSIS — Z7984 Long term (current) use of oral hypoglycemic drugs: Secondary | ICD-10-CM

## 2023-11-29 DIAGNOSIS — N269 Renal sclerosis, unspecified: Secondary | ICD-10-CM | POA: Diagnosis present

## 2023-11-29 DIAGNOSIS — Z7982 Long term (current) use of aspirin: Secondary | ICD-10-CM | POA: Diagnosis not present

## 2023-11-29 DIAGNOSIS — T466X5A Adverse effect of antihyperlipidemic and antiarteriosclerotic drugs, initial encounter: Secondary | ICD-10-CM | POA: Diagnosis present

## 2023-11-29 DIAGNOSIS — N179 Acute kidney failure, unspecified: Secondary | ICD-10-CM | POA: Diagnosis not present

## 2023-11-29 DIAGNOSIS — Z7985 Long-term (current) use of injectable non-insulin antidiabetic drugs: Secondary | ICD-10-CM | POA: Diagnosis not present

## 2023-11-29 DIAGNOSIS — Y830 Surgical operation with transplant of whole organ as the cause of abnormal reaction of the patient, or of later complication, without mention of misadventure at the time of the procedure: Secondary | ICD-10-CM | POA: Diagnosis present

## 2023-11-29 DIAGNOSIS — E785 Hyperlipidemia, unspecified: Secondary | ICD-10-CM | POA: Diagnosis present

## 2023-11-29 DIAGNOSIS — R933 Abnormal findings on diagnostic imaging of other parts of digestive tract: Secondary | ICD-10-CM | POA: Diagnosis present

## 2023-11-29 DIAGNOSIS — Z79624 Long term (current) use of inhibitors of nucleotide synthesis: Secondary | ICD-10-CM | POA: Diagnosis not present

## 2023-11-29 DIAGNOSIS — Z7952 Long term (current) use of systemic steroids: Secondary | ICD-10-CM

## 2023-11-29 DIAGNOSIS — E1122 Type 2 diabetes mellitus with diabetic chronic kidney disease: Secondary | ICD-10-CM | POA: Diagnosis present

## 2023-11-29 DIAGNOSIS — D631 Anemia in chronic kidney disease: Secondary | ICD-10-CM | POA: Diagnosis present

## 2023-11-29 DIAGNOSIS — T8619 Other complication of kidney transplant: Principal | ICD-10-CM | POA: Diagnosis present

## 2023-11-29 DIAGNOSIS — E872 Acidosis, unspecified: Secondary | ICD-10-CM | POA: Diagnosis present

## 2023-11-29 DIAGNOSIS — Z79899 Other long term (current) drug therapy: Secondary | ICD-10-CM

## 2023-11-29 DIAGNOSIS — Z8673 Personal history of transient ischemic attack (TIA), and cerebral infarction without residual deficits: Secondary | ICD-10-CM | POA: Diagnosis not present

## 2023-11-29 DIAGNOSIS — N9489 Other specified conditions associated with female genital organs and menstrual cycle: Secondary | ICD-10-CM | POA: Diagnosis not present

## 2023-11-29 DIAGNOSIS — N186 End stage renal disease: Secondary | ICD-10-CM | POA: Diagnosis present

## 2023-11-29 DIAGNOSIS — K649 Unspecified hemorrhoids: Secondary | ICD-10-CM | POA: Diagnosis present

## 2023-11-29 DIAGNOSIS — F17210 Nicotine dependence, cigarettes, uncomplicated: Secondary | ICD-10-CM | POA: Diagnosis present

## 2023-11-29 DIAGNOSIS — M6282 Rhabdomyolysis: Secondary | ICD-10-CM | POA: Diagnosis present

## 2023-11-29 DIAGNOSIS — I10 Essential (primary) hypertension: Secondary | ICD-10-CM | POA: Diagnosis present

## 2023-11-29 DIAGNOSIS — Z79621 Long term (current) use of calcineurin inhibitor: Secondary | ICD-10-CM

## 2023-11-29 DIAGNOSIS — Z8249 Family history of ischemic heart disease and other diseases of the circulatory system: Secondary | ICD-10-CM

## 2023-11-29 DIAGNOSIS — Z823 Family history of stroke: Secondary | ICD-10-CM

## 2023-11-29 LAB — BASIC METABOLIC PANEL WITH GFR
Anion gap: 15 (ref 5–15)
BUN: 67 mg/dL — ABNORMAL HIGH (ref 6–20)
CO2: 20 mmol/L — ABNORMAL LOW (ref 22–32)
Calcium: 9.3 mg/dL (ref 8.9–10.3)
Chloride: 100 mmol/L (ref 98–111)
Creatinine, Ser: 4.02 mg/dL — ABNORMAL HIGH (ref 0.44–1.00)
GFR, Estimated: 12 mL/min — ABNORMAL LOW (ref >60)
Glucose, Bld: 198 mg/dL — ABNORMAL HIGH (ref 70–99)
Potassium: 4.9 mmol/L (ref 3.5–5.1)
Sodium: 135 mmol/L (ref 135–145)

## 2023-11-29 LAB — CBC
HCT: 39.2 % (ref 36.0–46.0)
Hemoglobin: 12.7 g/dL (ref 12.0–15.0)
MCH: 29.4 pg (ref 26.0–34.0)
MCHC: 32.4 g/dL (ref 30.0–36.0)
MCV: 90.7 fL (ref 80.0–100.0)
Platelets: 284 K/uL (ref 150–400)
RBC: 4.32 MIL/uL (ref 3.87–5.11)
RDW: 13.2 % (ref 11.5–15.5)
WBC: 15.6 K/uL — ABNORMAL HIGH (ref 4.0–10.5)
nRBC: 0 % (ref 0.0–0.2)

## 2023-11-29 LAB — URINALYSIS, ROUTINE W REFLEX MICROSCOPIC
Bilirubin Urine: NEGATIVE
Glucose, UA: >=500 mg/dL — AB
Ketones, ur: NEGATIVE mg/dL
Leukocytes,Ua: NEGATIVE
Nitrite: NEGATIVE
Protein, ur: 100 mg/dL — AB
Specific Gravity, Urine: 1.015 (ref 1.005–1.030)
pH: 6 (ref 5.0–8.0)

## 2023-11-29 LAB — CK: Total CK: 17709 U/L — ABNORMAL HIGH (ref 38–234)

## 2023-11-29 MED ORDER — MYCOPHENOLATE MOFETIL 250 MG PO CAPS
500.0000 mg | ORAL_CAPSULE | Freq: Two times a day (BID) | ORAL | Status: DC
  Administered 2023-11-30 – 2023-12-04 (×10): 500 mg via ORAL
  Filled 2023-11-29 (×10): qty 2

## 2023-11-29 MED ORDER — SODIUM CHLORIDE 0.9% FLUSH
3.0000 mL | Freq: Two times a day (BID) | INTRAVENOUS | Status: DC
  Administered 2023-11-30 – 2023-12-02 (×4): 3 mL via INTRAVENOUS

## 2023-11-29 MED ORDER — MELATONIN 3 MG PO TABS: 6.0000 mg | ORAL_TABLET | Freq: Every evening | ORAL | Status: DC | PRN

## 2023-11-29 MED ORDER — SODIUM CHLORIDE 0.9 % IV BOLUS
1000.0000 mL | Freq: Once | INTRAVENOUS | Status: AC
  Administered 2023-11-29: 1000 mL via INTRAVENOUS

## 2023-11-29 MED ORDER — ATENOLOL 50 MG PO TABS
100.0000 mg | ORAL_TABLET | Freq: Every day | ORAL | Status: DC
  Administered 2023-11-30 – 2023-12-03 (×5): 100 mg via ORAL
  Filled 2023-11-29 (×6): qty 2

## 2023-11-29 MED ORDER — ACETAMINOPHEN 500 MG PO TABS: 1000.0000 mg | ORAL_TABLET | Freq: Four times a day (QID) | ORAL | Status: DC | PRN

## 2023-11-29 MED ORDER — SODIUM BICARBONATE 650 MG PO TABS
1300.0000 mg | ORAL_TABLET | Freq: Three times a day (TID) | ORAL | Status: DC
  Administered 2023-11-30 – 2023-12-03 (×13): 1300 mg via ORAL
  Filled 2023-11-29 (×13): qty 2

## 2023-11-29 MED ORDER — PREDNISONE 10 MG PO TABS
5.0000 mg | ORAL_TABLET | Freq: Every day | ORAL | Status: DC
  Administered 2023-11-30 – 2023-12-04 (×5): 5 mg via ORAL
  Filled 2023-11-29 (×5): qty 1

## 2023-11-29 MED ORDER — ALBUTEROL SULFATE (2.5 MG/3ML) 0.083% IN NEBU: 2.5000 mg | INHALATION_SOLUTION | RESPIRATORY_TRACT | Status: DC | PRN

## 2023-11-29 MED ORDER — HEPARIN SODIUM (PORCINE) 5000 UNIT/ML IJ SOLN
5000.0000 [IU] | Freq: Three times a day (TID) | INTRAMUSCULAR | Status: DC
  Administered 2023-11-30 – 2023-12-04 (×13): 5000 [IU] via SUBCUTANEOUS
  Filled 2023-11-29 (×12): qty 1

## 2023-11-29 MED ORDER — ZOLPIDEM TARTRATE 5 MG PO TABS
5.0000 mg | ORAL_TABLET | Freq: Every evening | ORAL | Status: DC | PRN
  Administered 2023-11-30 – 2023-12-03 (×4): 5 mg via ORAL
  Filled 2023-11-29 (×5): qty 1

## 2023-11-29 MED ORDER — INSULIN GLARGINE-YFGN 100 UNIT/ML ~~LOC~~ SOLN
8.0000 [IU] | Freq: Every day | SUBCUTANEOUS | Status: DC
  Administered 2023-11-30 – 2023-12-03 (×5): 8 [IU] via SUBCUTANEOUS
  Filled 2023-11-29 (×7): qty 0.08

## 2023-11-29 MED ORDER — TACROLIMUS 1 MG PO CAPS
1.0000 mg | ORAL_CAPSULE | Freq: Two times a day (BID) | ORAL | Status: DC
  Administered 2023-11-30 – 2023-12-04 (×9): 1 mg via ORAL
  Filled 2023-11-29 (×9): qty 1

## 2023-11-29 MED ORDER — ASPIRIN 325 MG PO TBEC
325.0000 mg | DELAYED_RELEASE_TABLET | Freq: Every day | ORAL | Status: DC
  Administered 2023-11-30 – 2023-12-04 (×5): 325 mg via ORAL
  Filled 2023-11-29 (×5): qty 1

## 2023-11-29 MED ORDER — BUPROPION HCL ER (XL) 150 MG PO TB24
150.0000 mg | ORAL_TABLET | Freq: Every day | ORAL | Status: DC
  Administered 2023-11-30 – 2023-12-04 (×5): 150 mg via ORAL
  Filled 2023-11-29 (×5): qty 1

## 2023-11-29 MED ORDER — ESCITALOPRAM OXALATE 10 MG PO TABS: 10.0000 mg | ORAL_TABLET | Freq: Every day | ORAL | Status: DC

## 2023-11-29 MED ORDER — POLYETHYLENE GLYCOL 3350 17 G PO PACK: 17.0000 g | PACK | Freq: Every day | ORAL | Status: DC | PRN

## 2023-11-29 MED ORDER — ONDANSETRON HCL 4 MG/2ML IJ SOLN: 4.0000 mg | Freq: Four times a day (QID) | INTRAMUSCULAR | Status: DC | PRN

## 2023-11-29 MED ORDER — SODIUM CHLORIDE 0.9 % IV SOLN: INTRAVENOUS | Status: AC

## 2023-11-29 NOTE — ED Provider Notes (Signed)
 Agency EMERGENCY DEPARTMENT AT [facility] ([facility]) Provider Note   CSN: [id number] Arrival date & time: 11/29/23  1737     Patient presents with: Abnormal Lab   Isabella Obrien is a 58 y.o. female with past medical history of right kidney transplant (2006), HLD, T2DM, long-term immunosuppressant medication, tobacco use, CVA presents to emergency department per PCP recommendation for elevated CK, concern for renal failure  Reports that she has had blood in her urine for the past week.  She endorses 2 episodes of vomiting this week, generalized weakness in her legs. Recently started statin on 10/14/23. No recent immobilization, increased exercise, seizures     Abnormal Lab      Prior to Admission medications   Medication Sig Start Date End Date Taking? Authorizing Provider  albuterol  (PROVENTIL ) (2.5 MG/3ML) 0.083% nebulizer solution Take 3 mLs (2.5 mg total) by nebulization 2 (two) times daily as needed for wheezing or shortness of breath. 01/29/21   Jodie Lavern CROME, MD  albuterol  (VENTOLIN  HFA) 108 [phone] Base) MCG/ACT inhaler Inhale 2 puffs into the lungs every 6 (six) hours as needed for wheezing or shortness of breath. 11/03/20   Jodie Lavern CROME, MD  amLODipine (NORVASC) 5 MG tablet Take 5 mg by mouth daily. 08/02/23   [provider]  aspirin  EC 325 MG tablet Take 1 tablet (325 mg total) by mouth daily. 10/14/23 10/13/24  Raenelle Coria, MD  atenolol  (TENORMIN ) 100 MG tablet Take 100 mg by mouth at bedtime.    [provider]  benazepril (LOTENSIN) 40 MG tablet Take 40 mg by mouth in the morning and at bedtime.    [provider]  buPROPion  (WELLBUTRIN  XL) 150 MG 24 hr tablet Take 1 tablet (150 mg total) by mouth daily. 08/10/23   Dohmeier, Dedra, MD  calcitRIOL  (ROCALTROL ) 0.25 MCG capsule Take 0.25 mcg by mouth daily. 10/03/23   [provider]  escitalopram  (LEXAPRO ) 10 MG tablet Take 10 mg by mouth at bedtime.    [provider]   FARXIGA  10 MG TABS tablet Take 10 mg by mouth in the morning.    [provider]  furosemide  (LASIX ) 40 MG tablet Take 1 tablet (40 mg total) by mouth daily as needed. Patient taking differently: Take 40 mg by mouth daily as needed for edema or fluid. 06/24/21   Jodie Lavern CROME, MD  glipiZIDE (GLUCOTROL XL) 10 MG 24 hr tablet Take 10 mg by mouth daily. 08/24/23   [provider]  ipratropium (ATROVENT ) 0.02 % nebulizer solution Take 2.5 mLs (0.5 mg total) by nebulization 2 (two) times daily as needed for wheezing or shortness of breath. 01/29/21   Jodie Lavern CROME, MD  KERENDIA  20 MG TABS Take 20 mg by mouth in the morning.    [provider]  mycophenolate  (CELLCEPT ) 500 MG tablet Take 500 mg by mouth in the morning and at bedtime.    [provider]  Omega-3 Fatty Acids (FISH OIL) 1000 MG CAPS Take 1,000 mg by mouth in the morning and at bedtime. 02/13/14   [provider]  OZEMPIC, 2 MG/DOSE, 8 MG/3ML SOPN Inject 2 mg into the skin once a week. 09/20/23   [provider]  predniSONE  (DELTASONE ) 5 MG tablet Take 5 mg by mouth daily with breakfast.    [provider]  rosuvastatin  (CRESTOR ) 40 MG tablet Take 1 tablet (40 mg total) by mouth at bedtime. 10/14/23 11/13/23  Raenelle Coria, MD  sodium bicarbonate  650 MG tablet  Take 1,300 mg by mouth 3 (three) times daily.    [provider]  tacrolimus  (PROGRAF ) 1 MG capsule Take 1 mg by mouth in the morning and at bedtime.    [provider]  TRESIBA FLEXTOUCH 100 UNIT/ML FlexTouch Pen Inject 13 Units into the skin at bedtime.    [provider]  zolpidem  (AMBIEN ) 10 MG tablet Take 10 mg by mouth at bedtime. 12/06/21   [provider]    Allergies: Cefuroxime, Benadryl [diphenhydramine hcl], Tape, and Wound dressing adhesive    Review of Systems  Genitourinary:  Positive for hematuria.    Updated Vital Signs BP 116/85 (BP Location: Right Arm)   Pulse (!)  102   Temp 98.2 F (36.8 C)   Resp 18   Ht 5' 3 (1.6 m)   Wt 58.1 kg   LMP 10/24/2012   SpO2 99%   BMI 22.67 kg/m   Physical Exam Vitals and nursing note reviewed.  Constitutional:      General: She is not in acute distress.    Appearance: Normal appearance.  HENT:     Head: Normocephalic and atraumatic.  Eyes:     Conjunctiva/sclera: Conjunctivae normal.  Cardiovascular:     Rate and Rhythm: Normal rate.  Pulmonary:     Effort: Pulmonary effort is normal. No respiratory distress.     Breath sounds: Normal breath sounds.  Abdominal:     General: Bowel sounds are normal. There is no distension.     Palpations: Abdomen is soft.     Tenderness: There is no abdominal tenderness. There is no guarding or rebound.     Comments: Nonsurgical abdomen with no peritoneal signs  Musculoskeletal:     Right lower leg: No edema.     Left lower leg: No edema.  Skin:    Coloration: Skin is not jaundiced or pale.  Neurological:     Mental Status: She is alert and oriented to person, place, and time. Mental status is at baseline.     (all labs ordered are listed, but only abnormal results are displayed) Labs Reviewed  CBC - Abnormal; Notable for the following components:      Result Value   WBC 15.6 (*)    All other components within normal limits  BASIC METABOLIC PANEL WITH GFR - Abnormal; Notable for the following components:   CO2 20 (*)    Glucose, Bld 198 (*)    BUN 67 (*)    Creatinine, Ser 4.02 (*)    GFR, Estimated 12 (*)    All other components within normal limits  URINALYSIS, ROUTINE W REFLEX MICROSCOPIC - Abnormal; Notable for the following components:   APPearance HAZY (*)    Glucose, UA >=500 (*)    Hgb urine dipstick LARGE (*)    Protein, ur 100 (*)    Bacteria, UA RARE (*)    All other components within normal limits  CK - Abnormal; Notable for the following components:   Total CK 17,709 (*)    All other components within normal limits     EKG: None  Radiology: CT Renal Stone Study Result Date: 11/29/2023 CLINICAL DATA:  Flank pain weakness EXAM: CT ABDOMEN AND PELVIS WITHOUT CONTRAST TECHNIQUE: Multidetector CT imaging of the abdomen and pelvis was performed following the standard protocol without IV contrast. RADIATION DOSE REDUCTION: This exam was performed according to the departmental dose-optimization program which includes automated exposure control, adjustment of the mA and/or kV according to patient size and/or  use of iterative reconstruction technique. COMPARISON:  Ultrasound 01/18/2023 FINDINGS: Lower chest: Lung bases demonstrate no acute airspace disease. Hepatobiliary: No focal liver abnormality is seen. No gallstones, gallbladder wall thickening, or biliary dilatation. Pancreas: Unremarkable. No pancreatic ductal dilatation or surrounding inflammatory changes. Spleen: Normal in size without focal abnormality. Adrenals/Urinary Tract: Adrenal glands are normal. Atrophic native kidneys. Right iliac fossa transplant kidney without hydronephrosis or focal abnormality without contrast. Bladder is unremarkable Stomach/Bowel: Stomach nonenlarged. No dilated small bowel. No acute bowel wall thickening. Negative appendix. Vascular/Lymphatic: Aortic atherosclerosis. No aneurysm. No suspicious lymph nodes. Reproductive: Lobulated uterus with 4.3 x 4.3 cm mass on the right side. Other: Negative for pelvic effusion or free air. Partially calcified slightly irregular soft tissue density anterior to the sacrum measuring 16 x 15 mm on series 3, image 58. Small left inferior presacral nodule measuring 10 mm on series 3, image 66. Mild presacral soft tissue thickening, series 3, image 63 Musculoskeletal: No acute osseous abnormality. IMPRESSION: 1. Negative for hydronephrosis or ureteral stone. Atrophic native kidneys. Right iliac fossa transplant kidney without hydronephrosis or focal abnormality in the absence of contrast. 2. Lobulated uterus  with 4.3 cm mass on the right side, likely due to fibroid change, with adnexal mass not entirely excluded. Correlation with nonemergent pelvic ultrasound is suggested. 3. Partially calcified slightly irregular soft tissue density anterior to the sacrum measuring 16 x 15 mm. Small left inferior presacral nodule measuring 10 mm. Mild soft tissue density in the presacral region, no adjacent sacral fracture or other inflammatory process in the pelvis. Nodules could be secondary to adenopathy, either infectious/inflammatory versus neoplastic in etiology. Correlate for any known history of malignancy. At a minimum, short interval CT follow-up would be advised. 4. Aortic atherosclerosis. Aortic Atherosclerosis (ICD10-I70.0). Electronically Signed   By: Luke Bun M.D.   On: 11/29/2023 19:39    Medications Ordered in the ED  sodium chloride  0.9 % bolus 1,000 mL (1,000 mLs Intravenous New Bag/Given 11/29/23 2221)                                    Medical Decision Making Risk Decision regarding hospitalization.   Patient presents to the ED for concern of hematuria, myalgia, this involves an extensive number of treatment options, and is a complaint that carries with it a high risk of complications and morbidity.  The differential diagnosis includes UTI, kidney stone, renal failure, rhabdomyolysis   Co morbidities that complicate the patient evaluation  Renal transplant Chronic immunosuppression Recent statin See HPI   Additional history obtained:  Additional history obtained from Nursing and Outside Medical Records   External records from outside source obtained and reviewed including triage note   Lab Tests:  I Ordered, and personally interpreted labs.  The pertinent results include:   CK 17,709 CBG 198 Creatinine 4.02 elevated from 1.77 one month ago   Imaging Studies ordered:  I ordered imaging studies including CT renal study I independently visualized and interpreted imaging  which showed  Negative for hydronephrosis or ureteral stone. Atrophic native kidneys. Right iliac fossa transplant kidney without hydronephrosis or focal abnormality in the absence of contrast. Lobulated uterus with 4.3 cm mass on the right side, likely due to fibroid change, with adnexal mass not entirely excluded. Correlation with nonemergent pelvic ultrasound is suggested. Partially calcified slightly irregular soft tissue density anterior to the sacrum measuring 16 x 15 mm. Small left inferior presacral nodule  measuring 10 mm. Mild soft tissue density in the presacral region, no adjacent sacral fracture or other inflammatory process in the pelvis. Nodules could be secondary to adenopathy, either infectious/inflammatory versus neoplastic in etiology I agree with the radiologist interpretation   Cardiac Monitoring:  The patient was maintained on a cardiac monitor.    Medicines ordered and prescription drug management:  I ordered medication including IVF  for acute renal failure  Reevaluation of the patient after these medicines showed that the patient improved I have reviewed the patients home medicines and have made adjustments as needed     Critical Interventions:  Rhabdo Acute renal failure   .Critical Care  Performed by: Minnie Tinnie BRAVO, PA Authorized by: Minnie Tinnie BRAVO, PA   Critical care provider statement:    Critical care time (minutes):  33   Critical care was necessary to treat or prevent imminent or life-threatening deterioration of the following conditions:  Renal failure   Critical care was time spent personally by me on the following activities:  Development of treatment plan with patient or surrogate, discussions with consultants, evaluation of patient's response to treatment, ordering and performing treatments and interventions, pulse oximetry and re-evaluation of patient's condition    Consultations Obtained:  I requested consultation with the hospitalist,   and discussed lab and imaging findings as well as pertinent plan - Dr. Segars accepts patient for admission I requested consultation with nephrology Dr. Jonel ,  and discussed lab and imaging findings as well as pertinent plan - they recommend:  No emergent dialysis Rehydration Can remain at [facility]. Will follow tomorrow   Problem List / ED Course:  Rhabdo AKI CK 17,709 UA positive for Hgb.  No flank pain nor abdominal pain. Creatinine 1.77 one-month ago increased to 4.02 today.  Provided 1 L IVF Urine appears noninfectious. No stone on CT Likely 2/2 statin that she started for first time following CVA on 10/14/23 Will admit patient for hydration   Reevaluation:  After the interventions noted above, I reevaluated the patient and found that they have :stayed the same     Dispostion:  After consideration of the diagnostic results and the patients response to treatment, I feel that the patent would benefit from admission.   Discussed ED workup, disposition, return to ED precautions with patient who expresses understanding agrees with plan.  All questions answered to their satisfaction.  They are agreeable to plan.  Discharge instructions provided on paperwork  Final diagnoses:  Acute renal injury [facility])  Non-traumatic rhabdomyolysis    ED Discharge Orders     None          Minnie Tinnie BRAVO, PA 11/29/23 2240    Yolande Lamar BROCKS, MD 11/30/23 1146

## 2023-11-29 NOTE — ED Provider Triage Note (Signed)
 Emergency Medicine Provider Triage Evaluation Note  Isabella Obrien , a 58 y.o. female  was evaluated in triage.  Pt complains of weakness. Reports that she went to her doctor today and was told to come here with concern for new kidney failure. Reports hx renal transplant 18 years ago. [place] the beach and started having generalized weakness, fatigue, and myalgias. Reports both her legs are weak, thought it was due to more walking at the beach, however symptoms have persisted since she has been back. Was told her creatinine went from 1 to 4. Reports her urine is dark and she was told she has blood in her urine. Denies abdominal pain  Review of Systems  Positive:  Negative:   Physical Exam  BP 116/85 (BP Location: Right Arm)   Pulse (!) 102   Temp 98.2 F (36.8 C)   Resp 18   Ht 5' 3 (1.6 m)   Wt 58.1 kg   LMP 10/24/2012   SpO2 99%   BMI 22.67 kg/m  Gen:   Awake, no distress   Resp:  Normal effort  MSK:   Moves extremities without difficulty  Other:    Medical Decision Making  Medically screening exam initiated at 6:45 PM.  Appropriate orders placed.  Isabella Obrien was informed that the remainder of the evaluation will be completed by another provider, this initial triage assessment does not replace that evaluation, and the importance of remaining in the ED until their evaluation is complete.     Nora Lauraine LABOR, PA-C 11/29/23 [phone]

## 2023-11-29 NOTE — ED Triage Notes (Signed)
 Pt to ED c/o abnormal labs, reports coming from PCP. Was told to come to the ED because kidney is failing reports hx of kidney transplant 18 years ago. Pt reports blood in urine x 1 week.

## 2023-11-30 ENCOUNTER — Inpatient Hospital Stay (HOSPITAL_COMMUNITY)

## 2023-11-30 DIAGNOSIS — M6282 Rhabdomyolysis: Secondary | ICD-10-CM | POA: Diagnosis not present

## 2023-11-30 DIAGNOSIS — N179 Acute kidney failure, unspecified: Secondary | ICD-10-CM

## 2023-11-30 DIAGNOSIS — N9489 Other specified conditions associated with female genital organs and menstrual cycle: Secondary | ICD-10-CM | POA: Diagnosis not present

## 2023-11-30 LAB — CBC
HCT: 33.9 % — ABNORMAL LOW (ref 36.0–46.0)
Hemoglobin: 11 g/dL — ABNORMAL LOW (ref 12.0–15.0)
MCH: 29.7 pg (ref 26.0–34.0)
MCHC: 32.4 g/dL (ref 30.0–36.0)
MCV: 91.6 fL (ref 80.0–100.0)
Platelets: 247 K/uL (ref 150–400)
RBC: 3.7 MIL/uL — ABNORMAL LOW (ref 3.87–5.11)
RDW: 13.2 % (ref 11.5–15.5)
WBC: 12.3 K/uL — ABNORMAL HIGH (ref 4.0–10.5)
nRBC: 0 % (ref 0.0–0.2)

## 2023-11-30 LAB — BASIC METABOLIC PANEL WITH GFR
Anion gap: 12 (ref 5–15)
BUN: 67 mg/dL — ABNORMAL HIGH (ref 6–20)
CO2: 16 mmol/L — ABNORMAL LOW (ref 22–32)
Calcium: 8.3 mg/dL — ABNORMAL LOW (ref 8.9–10.3)
Chloride: 108 mmol/L (ref 98–111)
Creatinine, Ser: 3.64 mg/dL — ABNORMAL HIGH (ref 0.44–1.00)
GFR, Estimated: 14 mL/min — ABNORMAL LOW (ref >60)
Glucose, Bld: 194 mg/dL — ABNORMAL HIGH (ref 70–99)
Potassium: 4.6 mmol/L (ref 3.5–5.1)
Sodium: 136 mmol/L (ref 135–145)

## 2023-11-30 LAB — PHOSPHORUS: Phosphorus: 6.2 mg/dL — ABNORMAL HIGH (ref 2.5–4.6)

## 2023-11-30 LAB — MAGNESIUM: Magnesium: 1.9 mg/dL (ref 1.7–2.4)

## 2023-11-30 LAB — CK: Total CK: 15670 U/L — ABNORMAL HIGH (ref 38–234)

## 2023-11-30 LAB — GLUCOSE, CAPILLARY
Glucose-Capillary: 136 mg/dL — ABNORMAL HIGH (ref 70–99)
Glucose-Capillary: 142 mg/dL — ABNORMAL HIGH (ref 70–99)

## 2023-11-30 NOTE — Plan of Care (Signed)

## 2023-11-30 NOTE — Consult Note (Signed)
 [facility]  INPATIENT CONSULTATION  Reason for Consultation: AKI on CKD Requesting Provider: Dr. Arlon  HPI: FELICITE ZEIMET is an 58 y.o. 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   Pt follows with Dr. Gearline SERUM for her kidney transplant.  Baseline Cr in the 1.8-2mg /dL range.   She went to the beach last week and initially felt fine.  After several days of exerting climbing stairs to beach and walking in sand she developed LE weakness.   Subsequently urine became brown.  No issues with sensation, bowel or bladder control. She had a usual visit with PCP yesterday and labs showed kidney function much worse and she was instructed to present to the ED.  She was found to have BUN 67, Cr 4, K 4.9, Bicarb 20, CK 17k, UA dip blood but none on microscopy.  1+ protein on dip c/w priors.  She was started on IVF and statin held.  This morning her LE weakness is feeling a bit better.    Non con renal stone protocol CT normal kidney transplant but possible R adnexal mass confirmed on pelvic US .  Kidney transplant ultrasound unrevealing.    PMH: Past Medical History:  Diagnosis Date   Chronic kidney disease    has kidney transplant - having aphoresis now every other week   Diabetes mellitus without complication (HCC)    dm type 2 - fasting 150   History of blood product transfusion    Hyperlipidemia    Hypertension    Menopausal hot flushes 09/06/2017   Controlled on Lexapro  by Dr. Marget   Neuromuscular disorder [facility])    PSH: Past Surgical History:  Procedure Laterality Date   COLONOSCOPY     diateck catheter placement  09/2013   KIDNEY TRANSPLANT     ORIF ANKLE FRACTURE Left 2014   TEE WITHOUT CARDIOVERSION N/A 01/13/2023   Procedure: TRANSESOPHAGEAL ECHOCARDIOGRAM;  Surgeon: Santo Stanly LABOR, MD;  Location: [facility] INVASIVE CV LAB;  Service: Cardiovascular;  Laterality: N/A;     Past Medical History:  Diagnosis Date   Chronic kidney disease    has kidney transplant - having aphoresis now every other week   Diabetes mellitus without complication (HCC)    dm type 2 - fasting 150   History of blood product transfusion    Hyperlipidemia    Hypertension    Menopausal hot flushes 09/06/2017   Controlled on Lexapro  by Dr. Marget   Neuromuscular disorder [facility])     Medications:  I have reviewed the patient's current medications.  Medications Prior to Admission  Medication Sig Dispense Refill   acetaminophen  (TYLENOL ) 500 MG tablet Take 1,000 mg by mouth every 6 (six) hours as needed for headache or mild pain (pain score 1-3).     albuterol  (PROVENTIL ) (2.5 MG/3ML) 0.083% nebulizer solution Take 3 mLs (2.5 mg total) by nebulization 2 (two) times daily as needed for wheezing or shortness of breath. 150 mL 1   albuterol  (VENTOLIN  HFA) 108 (90 Base) MCG/ACT inhaler Inhale 2 puffs into the lungs every 6 (six) hours as needed for wheezing or shortness of breath. 1 each 5   amLODipine (NORVASC) 5 MG tablet Take 5 mg by mouth daily.     aspirin  EC 325 MG tablet Take 1 tablet (325 mg total) by mouth daily. 90 tablet 3   atenolol  (TENORMIN ) 100 MG tablet Take 100 mg by  mouth at bedtime.     benazepril (LOTENSIN) 40 MG tablet Take 20 mg by mouth in the morning and at bedtime.     buPROPion  (WELLBUTRIN  XL) 150 MG 24 hr tablet Take 1 tablet (150 mg total) by mouth daily. 30 tablet 3   calcitRIOL  (ROCALTROL ) 0.25 MCG capsule Take 0.25 mcg by mouth daily.     FARXIGA  10 MG TABS tablet Take 10 mg by mouth in the morning.     furosemide  (LASIX ) 40 MG tablet Take 1 tablet (40 mg total) by mouth daily as needed. (Patient taking differently: Take 40 mg by mouth daily as needed for edema or fluid.) 30 tablet 3   glipiZIDE (GLUCOTROL XL) 10 MG 24 hr tablet Take 10 mg by mouth daily.     hydrocortisone  (ANUSOL -HC) 2.5 % rectal cream Place 1 Application rectally 2 (two) times daily.      KERENDIA  20 MG TABS Take 20 mg by mouth in the morning.     mycophenolate  (CELLCEPT ) 500 MG tablet Take 500 mg by mouth in the morning and at bedtime.     Omega-3 Fatty Acids (FISH OIL) 1000 MG CAPS Take 1,000 mg by mouth in the morning and at bedtime.     predniSONE  (DELTASONE ) 5 MG tablet Take 5 mg by mouth daily with breakfast.     sodium bicarbonate  650 MG tablet Take 1,300 mg by mouth 3 (three) times daily.     tacrolimus  (PROGRAF ) 1 MG capsule Take 1 mg by mouth in the morning and at bedtime.     ticagrelor  (BRILINTA ) 90 MG TABS tablet Take 90 mg by mouth 2 (two) times daily.     TRESIBA FLEXTOUCH 100 UNIT/ML FlexTouch Pen Inject 13 Units into the skin at bedtime.     zolpidem  (AMBIEN ) 10 MG tablet Take 10 mg by mouth at bedtime.     fluticasone-salmeterol (WIXELA INHUB) 100-50 MCG/ACT AEPB Inhale 1 puff into the lungs 2 (two) times daily. (Patient not taking: Reported on 11/29/2023)      ALLERGIES:   Allergies  Allergen Reactions   Cefuroxime Swelling and Other (See Comments)    Ceftin:hypertension   Benadryl [Diphenhydramine Hcl] Swelling, Anxiety and Other (See Comments)    Hyperactivity and angioedema, also   Tape Rash and Other (See Comments)    Paper tape is okay   Wound Dressing Adhesive Rash and Other (See Comments)    Paper tape is ok    FAM HX: Family History  Problem Relation Age of Onset   Hypertension Mother    Other Father 39       amyloidosis   Healthy Brother    Stroke Paternal Grandmother     Social History:   reports that she has been smoking cigarettes. She started smoking about 37 years ago. She has a 0.8 pack-year smoking history. She has never used smokeless tobacco. She reports current alcohol use of about 1.0 standard drink of alcohol per week. She reports that she does not use drugs.  ROS: 12 system ROS neg except per HPI  Blood pressure 136/85, pulse 69, temperature 98 F (36.7 C), resp. rate 17, height 5' 3 (1.6 m), weight 58.1 kg, last  menstrual period 10/24/2012, SpO2 98%. PHYSICAL EXAM: Gen: comfortable in bed  Eyes: anicteric ENT: MMM Neck: supple CV:  RRR Abd: soft, RLQ transplant nontender Back: clear GU: no foley Extr: no edema Neuro:  5-/5 LE strength, no focal deficit   Results for orders placed or performed during the hospital  encounter of 11/29/23 (from the past 48 hours)  CBC     Status: Abnormal   Collection Time: 11/29/23  6:45 PM  Result Value Ref Range   WBC 15.6 (H) 4.0 - 10.5 K/uL   RBC 4.32 3.87 - 5.11 MIL/uL   Hemoglobin 12.7 12.0 - 15.0 g/dL   HCT 60.7 63.9 - 53.9 %   MCV 90.7 80.0 - 100.0 fL   MCH 29.4 26.0 - 34.0 pg   MCHC 32.4 30.0 - 36.0 g/dL   RDW 86.7 88.4 - 84.4 %   Platelets 284 150 - 400 K/uL   nRBC 0.0 0.0 - 0.2 %    Comment: Performed at [facility], [address], [place], [place] [address]  Basic metabolic panel     Status: Abnormal   Collection Time: 11/29/23  6:45 PM  Result Value Ref Range   Sodium 135 135 - 145 mmol/L   Potassium 4.9 3.5 - 5.1 mmol/L   Chloride 100 98 - 111 mmol/L   CO2 20 (L) 22 - 32 mmol/L   Glucose, Bld 198 (H) 70 - 99 mg/dL    Comment: Glucose reference range applies only to samples taken after fasting for at least 8 hours.   BUN 67 (H) 6 - 20 mg/dL   Creatinine, Ser 5.97 (H) 0.44 - 1.00 mg/dL   Calcium  9.3 8.9 - 10.3 mg/dL   GFR, Estimated 12 (L) >60 mL/min    Comment: (NOTE) Calculated using the CKD-EPI Creatinine Equation (2021)    Anion gap 15 5 - 15    Comment: Performed at [facility], [address] [place], [place] [address]  Urinalysis, Routine w reflex microscopic -Urine, Clean Catch     Status: Abnormal   Collection Time: 11/29/23  6:45 PM  Result Value Ref Range   Color, Urine YELLOW YELLOW   APPearance HAZY (A) CLEAR   Specific Gravity, Urine 1.015 1.005 - 1.030   pH 6.0 5.0 - 8.0   Glucose, UA >=500 (A) NEGATIVE mg/dL   Hgb urine dipstick LARGE (A) NEGATIVE   Bilirubin Urine NEGATIVE NEGATIVE   Ketones, ur  NEGATIVE NEGATIVE mg/dL   Protein, ur 899 (A) NEGATIVE mg/dL   Nitrite NEGATIVE NEGATIVE   Leukocytes,Ua NEGATIVE NEGATIVE   RBC / HPF 0-5 0 - 5 RBC/hpf   WBC, UA 0-5 0 - 5 WBC/hpf   Bacteria, UA RARE (A) NONE SEEN   Squamous Epithelial / HPF 0-5 0 - 5 /HPF   Mucus PRESENT    Amorphous Crystal PRESENT     Comment: Performed at [facility], [address], [place], [place] [address]  CK     Status: Abnormal   Collection Time: 11/29/23  6:45 PM  Result Value Ref Range   Total CK 17,709 (H) 38 - 234 U/L    Comment: RESULT CONFIRMED BY MANUAL DILUTION Performed at [facility], [address], [place], [place] [address]   Glucose, capillary     Status: Abnormal   Collection Time: 11/30/23 12:44 AM  Result Value Ref Range   Glucose-Capillary 142 (H) 70 - 99 mg/dL    Comment: Glucose reference range applies only to samples taken after fasting for at least 8 hours.  Basic metabolic panel with GFR     Status: Abnormal   Collection Time: 11/30/23  1:39 AM  Result Value Ref Range   Sodium 136 135 - 145 mmol/L   Potassium 4.6 3.5 - 5.1 mmol/L  Chloride 108 98 - 111 mmol/L   CO2 16 (L) 22 - 32 mmol/L   Glucose, Bld 194 (H) 70 - 99 mg/dL    Comment: Glucose reference range applies only to samples taken after fasting for at least 8 hours.   BUN 67 (H) 6 - 20 mg/dL   Creatinine, Ser 6.35 (H) 0.44 - 1.00 mg/dL   Calcium  8.3 (L) 8.9 - 10.3 mg/dL   GFR, Estimated 14 (L) >60 mL/min    Comment: (NOTE) Calculated using the CKD-EPI Creatinine Equation (2021)    Anion gap 12 5 - 15    Comment: Performed at [facility], [address], [place], [place] [address]  CBC     Status: Abnormal   Collection Time: 11/30/23  1:39 AM  Result Value Ref Range   WBC 12.3 (H) 4.0 - 10.5 K/uL   RBC 3.70 (L) 3.87 - 5.11 MIL/uL   Hemoglobin 11.0 (L) 12.0 - 15.0 g/dL   HCT 66.0 (L) 63.9 - 53.9 %   MCV 91.6 80.0 - 100.0 fL   MCH 29.7 26.0 - 34.0 pg   MCHC 32.4 30.0 - 36.0 g/dL   RDW 86.7  88.4 - 84.4 %   Platelets 247 150 - 400 K/uL   nRBC 0.0 0.0 - 0.2 %    Comment: Performed at [facility], [address] [place], [place] [address]  Magnesium      Status: None   Collection Time: 11/30/23  1:39 AM  Result Value Ref Range   Magnesium  1.9 1.7 - 2.4 mg/dL    Comment: Performed at [facility], [address], [place], [place] [address]  Phosphorus     Status: Abnormal   Collection Time: 11/30/23  1:39 AM  Result Value Ref Range   Phosphorus 6.2 (H) 2.5 - 4.6 mg/dL    Comment: Performed at [facility], [address] [place], [place] [address]  CK     Status: Abnormal   Collection Time: 11/30/23  1:39 AM  Result Value Ref Range   Total CK 15,670 (H) 38 - 234 U/L    Comment: RESULT CONFIRMED BY MANUAL DILUTION Performed at [facility], [address], [place], [place] [address]     US  Renal Transplant w/Doppler Result Date: 11/30/2023 CLINICAL DATA:  Acute kidney injury. EXAM: ULTRASOUND OF RENAL TRANSPLANT WITH RENAL DOPPLER ULTRASOUND TECHNIQUE: Ultrasound examination of the renal transplant was performed with gray-scale, color and duplex doppler evaluation. COMPARISON:  CT abdomen pelvis 11/29/2023. Ultrasound kidney 01/18/2023 FINDINGS: Transplant kidney location: Right lower quadrant Transplant Kidney: Renal measurements: 11.6 x 5.3 x 5.1 cm = volume: . Normal in size and parenchymal echogenicity. No evidence of mass or hydronephrosis. No peri-transplant fluid collection seen. Color flow in the main renal artery:  Yes Color flow in the main renal vein:  Yes Duplex Doppler Evaluation: Main Renal Artery Velocity: 51 cm/sec Main Renal Artery Resistive Index: 0.8 Venous waveform in main renal vein:  Present Intrarenal resistive index in upper pole:  0.76 (normal 0.6-0.8; equivocal 0.8-0.9; abnormal >= 0.9) Intrarenal resistive index in lower pole: 0.77 (normal 0.6-0.8; equivocal 0.8-0.9; abnormal >= 0.9) Bladder: Normal for degree of bladder  distention. Other findings:  None. IMPRESSION: Normal ultrasound appearance of right lower quadrant transplant kidney. Electronically Signed   By: Elsie Gravely M.D.   On: 11/30/2023 00:31   US  PELVIC COMPLETE WITH TRANSVAGINAL Result Date: 11/30/2023 CLINICAL DATA:  Adnexal mass. EXAM: TRANSABDOMINAL AND TRANSVAGINAL  ULTRASOUND OF PELVIS TECHNIQUE: Both transabdominal and transvaginal ultrasound examinations of the pelvis were performed. Transabdominal technique was performed for global imaging of the pelvis including uterus, ovaries, adnexal regions, and pelvic cul-de-sac. It was necessary to proceed with endovaginal exam following the transabdominal exam to visualize the ovaries and endometrium. COMPARISON:  CT abdomen and pelvis 11/29/2023 FINDINGS: Uterus Measurements: 5.1 x 2.6 x 2.7 cm = volume: 18 mL. No fibroids or other mass visualized. Endometrium Thickness: 3.7 mm.  Small amount of fluid in the endocervical canal. Right ovary The right ovary is not specifically demonstrated. In the right adnexum, there is a heterogeneous solid structure measuring 5.1 x 4.1 x 4.3 cm. This corresponds to the lesion seen on CT. Visualization is somewhat limited by bowel. This could represent an enlarged right ovary/ovarian mass or it could be an exophytic fibroid. Left ovary Measurements: 2.6 x 1.8 x 3.3 cm = volume: 8 mL. Normal appearance/no adnexal mass. Other findings No abnormal free fluid. IMPRESSION: 1. Visualization of the right adnexa is limited due to bowel. The right ovary is not specifically visualized. There is a 5.1 cm solid mass lesion in the right adnexum corresponding to CT abnormality. This could represent a right ovarian mass or an exophytic fibroid. Consider elective follow-up MRI. 2. Normal ultrasound appearance of the uterus and left ovary. Electronically Signed   By: Elsie Gravely M.D.   On: 11/30/2023 00:28   CT Renal Stone Study Result Date: 11/29/2023 CLINICAL DATA:  Flank pain weakness  EXAM: CT ABDOMEN AND PELVIS WITHOUT CONTRAST TECHNIQUE: Multidetector CT imaging of the abdomen and pelvis was performed following the standard protocol without IV contrast. RADIATION DOSE REDUCTION: This exam was performed according to the departmental dose-optimization program which includes automated exposure control, adjustment of the mA and/or kV according to patient size and/or use of iterative reconstruction technique. COMPARISON:  Ultrasound 01/18/2023 FINDINGS: Lower chest: Lung bases demonstrate no acute airspace disease. Hepatobiliary: No focal liver abnormality is seen. No gallstones, gallbladder wall thickening, or biliary dilatation. Pancreas: Unremarkable. No pancreatic ductal dilatation or surrounding inflammatory changes. Spleen: Normal in size without focal abnormality. Adrenals/Urinary Tract: Adrenal glands are normal. Atrophic native kidneys. Right iliac fossa transplant kidney without hydronephrosis or focal abnormality without contrast. Bladder is unremarkable Stomach/Bowel: Stomach nonenlarged. No dilated small bowel. No acute bowel wall thickening. Negative appendix. Vascular/Lymphatic: Aortic atherosclerosis. No aneurysm. No suspicious lymph nodes. Reproductive: Lobulated uterus with 4.3 x 4.3 cm mass on the right side. Other: Negative for pelvic effusion or free air. Partially calcified slightly irregular soft tissue density anterior to the sacrum measuring 16 x 15 mm on series 3, image 58. Small left inferior presacral nodule measuring 10 mm on series 3, image 66. Mild presacral soft tissue thickening, series 3, image 63 Musculoskeletal: No acute osseous abnormality. IMPRESSION: 1. Negative for hydronephrosis or ureteral stone. Atrophic native kidneys. Right iliac fossa transplant kidney without hydronephrosis or focal abnormality in the absence of contrast. 2. Lobulated uterus with 4.3 cm mass on the right side, likely due to fibroid change, with adnexal mass not entirely excluded.  Correlation with nonemergent pelvic ultrasound is suggested. 3. Partially calcified slightly irregular soft tissue density anterior to the sacrum measuring 16 x 15 mm. Small left inferior presacral nodule measuring 10 mm. Mild soft tissue density in the presacral region, no adjacent sacral fracture or other inflammatory process in the pelvis. Nodules could be secondary to adenopathy, either infectious/inflammatory versus neoplastic in etiology. Correlate for any known history of malignancy. At a  minimum, short interval CT follow-up would be advised. 4. Aortic atherosclerosis. Aortic Atherosclerosis (ICD10-I70.0). Electronically Signed   By: Luke Bun M.D.   On: 11/29/2023 19:39    Assessment/PlanLorraine H Bakken is an 58 y.o. 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   **AKI:  baseline Cr 1.8-2.2mg /dL presenting with Cr 4 in setting of elevated CK c/w rhabdo etiology exertional +/- statin.  UA shows blood but none on microscopy, proteinuria at baseline.  Do not think recurrent FSGS but check UP/C.  Do not think rejection.  No plans for kidney biopsy at this time.  Cont IVF and daily labs.  Holding ACEi, SGLT2i, kerendia , lasix  and statin for now.    Cr should continue to improve.    **ESRD s/p LRD kidney transplant 2006:  had early recurrence of FSGS requiring plex but no recent issues.  Cont outpt meds - tac, cellcept , pred.    **HTN:  ACEi on hold, on home atenolol . Resume home amlodipine if BP trending up - currently 120s.   **possible adnexal mass:  pt very worried about malignancy, will obtain MRI (non con due to GFR) while inpatient.  Gyn referral as needed.  **DM:  09/2023 A1c in 8s, discussed need for improved control long term.  Insulin  per primary.   **NAGMA:  on po bicarb, follow.   Will follow.   Manuelita DELENA Barters 11/30/2023, 1:05 PM

## 2023-11-30 NOTE — H&P (Addendum)
 History and Physical    Isabella Obrien FMW:[id number] DOB: 02/15/1966 DOA: 11/29/2023  PCP: Rolinda Millman, MD   Patient coming from: Home   Chief Complaint:  Chief Complaint  Patient presents with   Abnormal Lab    HPI:  Isabella Obrien is a 58 y.o. 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   Patient notes last week she was at the beach, was walking up and down multiple standings to get to the beach every day.  She developed proximal leg soreness and weakness.  Developed progressive fatigue.  Noted that her urine became much darker and thought that she may have blood in her urine.  Will try to stay hydrated.  Did have vomiting.  Saw her primary care due to these symptoms and had labs demonstrating AKI.  No fever chills or other illness.  No recent change in medications.   Review of Systems:  ROS complete and negative except as marked above   Allergies  Allergen Reactions   Cefuroxime Swelling and Other (See Comments)    Ceftin:hypertension   Benadryl [Diphenhydramine Hcl] Swelling, Anxiety and Other (See Comments)    Hyperactivity and angioedema, also   Tape Rash and Other (See Comments)    Paper tape is okay   Wound Dressing Adhesive Rash and Other (See Comments)    Paper tape is ok    Prior to Admission medications   Medication Sig Start Date End Date Taking? Authorizing Provider  acetaminophen  (TYLENOL ) 500 MG tablet Take 1,000 mg by mouth every 6 (six) hours as needed for headache or mild pain (pain score 1-3).   Yes [provider]  albuterol  (PROVENTIL ) (2.5 MG/3ML) 0.083% nebulizer solution Take 3 mLs (2.5 mg total) by nebulization 2 (two) times daily as needed for wheezing or shortness of breath. 01/29/21  Yes Jodie Lavern CROME, MD  albuterol  (VENTOLIN  HFA) 108 (90 Base) MCG/ACT inhaler Inhale 2 puffs into  the lungs every 6 (six) hours as needed for wheezing or shortness of breath. 11/03/20  Yes Jodie Lavern CROME, MD  amLODipine (NORVASC) 5 MG tablet Take 5 mg by mouth daily. 08/02/23  Yes [provider]  aspirin  EC 325 MG tablet Take 1 tablet (325 mg total) by mouth daily. 10/14/23 10/13/24 Yes Raenelle Coria, MD  atenolol  (TENORMIN ) 100 MG tablet Take 100 mg by mouth at bedtime.   Yes [provider]  benazepril (LOTENSIN) 40 MG tablet Take 20 mg by mouth in the morning and at bedtime.   Yes [provider]  buPROPion  (WELLBUTRIN  XL) 150 MG 24 hr tablet Take 1 tablet (150 mg total) by mouth daily. 08/10/23  Yes Dohmeier, Dedra, MD  calcitRIOL  (ROCALTROL ) 0.25 MCG capsule Take 0.25 mcg by mouth daily. 10/03/23  Yes [provider]  FARXIGA  10 MG TABS tablet Take 10 mg by mouth in the morning.   Yes [provider]  furosemide  (LASIX ) 40 MG tablet Take 1 tablet (40 mg total) by mouth daily as needed. Patient taking differently: Take 40 mg by mouth daily as needed for edema or fluid. 06/24/21  Yes Jodie Lavern CROME, MD  glipiZIDE (GLUCOTROL XL) 10 MG 24 hr tablet Take 10 mg by mouth daily. 08/24/23  Yes [provider]  hydrocortisone  (ANUSOL -HC) 2.5 % rectal cream Place 1 Application rectally 2 (two) times daily.  Yes [provider]  KERENDIA  20 MG TABS Take 20 mg by mouth in the morning.   Yes [provider]  mycophenolate  (CELLCEPT ) 500 MG tablet Take 500 mg by mouth in the morning and at bedtime.   Yes [provider]  Omega-3 Fatty Acids (FISH OIL) 1000 MG CAPS Take 1,000 mg by mouth in the morning and at bedtime. 02/13/14  Yes [provider]  predniSONE  (DELTASONE ) 5 MG tablet Take 5 mg by mouth daily with breakfast.   Yes [provider]  sodium bicarbonate  650 MG tablet Take 1,300 mg by mouth 3 (three) times daily.   Yes [provider]  tacrolimus  (PROGRAF ) 1 MG capsule Take 1 mg by mouth in the  morning and at bedtime.   Yes [provider]  ticagrelor  (BRILINTA ) 90 MG TABS tablet Take 90 mg by mouth 2 (two) times daily. 10/14/23  Yes [provider]  TRESIBA FLEXTOUCH 100 UNIT/ML FlexTouch Pen Inject 13 Units into the skin at bedtime.   Yes [provider]  zolpidem  (AMBIEN ) 10 MG tablet Take 10 mg by mouth at bedtime. 12/06/21  Yes [provider]  fluticasone-salmeterol (WIXELA INHUB) 100-50 MCG/ACT AEPB Inhale 1 puff into the lungs 2 (two) times daily. Patient not taking: Reported on 11/29/2023    [provider]    Past Medical History:  Diagnosis Date   Chronic kidney disease    has kidney transplant - having aphoresis now every other week   Diabetes mellitus without complication (HCC)    dm type 2 - fasting 150   History of blood product transfusion    Hyperlipidemia    Hypertension    Menopausal hot flushes 09/06/2017   Controlled on Lexapro  by Dr. Marget   Neuromuscular disorder [facility])     Past Surgical History:  Procedure Laterality Date   COLONOSCOPY     diateck catheter placement  09/2013   KIDNEY TRANSPLANT     ORIF ANKLE FRACTURE Left 2014   TEE WITHOUT CARDIOVERSION N/A 01/13/2023   Procedure: TRANSESOPHAGEAL ECHOCARDIOGRAM;  Surgeon: Santo Stanly LABOR, MD;  Location: [facility] INVASIVE CV LAB;  Service: Cardiovascular;  Laterality: N/A;     reports that she has been smoking cigarettes. She started smoking about 37 years ago. She has a 0.8 pack-year smoking history. She has never used smokeless tobacco. She reports current alcohol use of about 1.0 standard drink of alcohol per week. She reports that she does not use drugs.  Family History  Problem Relation Age of Onset   Hypertension Mother    Other Father 78       amyloidosis   Healthy Brother    Stroke Paternal Grandmother      Physical Exam: Vitals:   11/29/23 1808 11/29/23 1812 11/29/23 2252 11/30/23 0038  BP: 116/85  109/71 (!) 138/93  Pulse: (!) 102  68  (!) 103  Resp: 18  20 18   Temp: 98.2 F (36.8 C)  97.8 F (36.6 C) 97.8 F (36.6 C)  TempSrc:   Oral Oral  SpO2: 99%  100% 100%  Weight:  58.1 kg    Height:  5' 3 (1.6 m)      Gen: Awake, alert, NAD   CV: Regular, normal S1, S2, no murmurs  Resp: Normal WOB, CTAB  Abd: Flat, normoactive, nontender MSK: Symmetric, no edema  Skin: No rashes or lesions to exposed skin  Neuro: Alert and interactive  Psych: euthymic, appropriate    Data review:   Labs  reviewed, notable for:   Bicarb 20, AG 15, BUN 6 7, creatinine 4.2, blood glucose 198 [place] [address] WBC 15 UA positive blood but no RBC  Micro:  Results for orders placed or performed in visit on 01/19/19  TIQ-MISC     Status: None   Collection Time: 01/19/19  2:33 PM  Result Value Ref Range Status   QUESTION/PROBLEM:   Final    Comment: . There is a question regarding the following specimen submitted and/or the test requested. .    QUESTION: VERIFY SOURCE  Final    Comment: REQUESTED INFORMATION _________________________________ . AUTHORIZED SIGNATURE __________________________________ . TO PREVENT FURTHER DELAYS IN TESTING, PLEASE COMPLETE INFORMATION ABOVE AND FAX TO [phone] TO RESOLVE  THIS ORDER.     Imaging reviewed:  US  Renal Transplant w/Doppler Result Date: 11/30/2023 CLINICAL DATA:  Acute kidney injury. EXAM: ULTRASOUND OF RENAL TRANSPLANT WITH RENAL DOPPLER ULTRASOUND TECHNIQUE: Ultrasound examination of the renal transplant was performed with gray-scale, color and duplex doppler evaluation. COMPARISON:  CT abdomen pelvis 11/29/2023. Ultrasound kidney 01/18/2023 FINDINGS: Transplant kidney location: Right lower quadrant Transplant Kidney: Renal measurements: 11.6 x 5.3 x 5.1 cm = volume: . Normal in size and parenchymal echogenicity. No evidence of mass or hydronephrosis. No peri-transplant fluid collection seen. Color flow in the main renal artery:  Yes Color flow in the main renal vein:  Yes Duplex  Doppler Evaluation: Main Renal Artery Velocity: 51 cm/sec Main Renal Artery Resistive Index: 0.8 Venous waveform in main renal vein:  Present Intrarenal resistive index in upper pole:  0.76 (normal 0.6-0.8; equivocal 0.8-0.9; abnormal >= 0.9) Intrarenal resistive index in lower pole: 0.77 (normal 0.6-0.8; equivocal 0.8-0.9; abnormal >= 0.9) Bladder: Normal for degree of bladder distention. Other findings:  None. IMPRESSION: Normal ultrasound appearance of right lower quadrant transplant kidney. Electronically Signed   By: Elsie Gravely M.D.   On: 11/30/2023 00:31   US  PELVIC COMPLETE WITH TRANSVAGINAL Result Date: 11/30/2023 CLINICAL DATA:  Adnexal mass. EXAM: TRANSABDOMINAL AND TRANSVAGINAL ULTRASOUND OF PELVIS TECHNIQUE: Both transabdominal and transvaginal ultrasound examinations of the pelvis were performed. Transabdominal technique was performed for global imaging of the pelvis including uterus, ovaries, adnexal regions, and pelvic cul-de-sac. It was necessary to proceed with endovaginal exam following the transabdominal exam to visualize the ovaries and endometrium. COMPARISON:  CT abdomen and pelvis 11/29/2023 FINDINGS: Uterus Measurements: 5.1 x 2.6 x 2.7 cm = volume: 18 mL. No fibroids or other mass visualized. Endometrium Thickness: 3.7 mm.  Small amount of fluid in the endocervical canal. Right ovary The right ovary is not specifically demonstrated. In the right adnexum, there is a heterogeneous solid structure measuring 5.1 x 4.1 x 4.3 cm. This corresponds to the lesion seen on CT. Visualization is somewhat limited by bowel. This could represent an enlarged right ovary/ovarian mass or it could be an exophytic fibroid. Left ovary Measurements: 2.6 x 1.8 x 3.3 cm = volume: 8 mL. Normal appearance/no adnexal mass. Other findings No abnormal free fluid. IMPRESSION: 1. Visualization of the right adnexa is limited due to bowel. The right ovary is not specifically visualized. There is a 5.1 cm solid mass  lesion in the right adnexum corresponding to CT abnormality. This could represent a right ovarian mass or an exophytic fibroid. Consider elective follow-up MRI. 2. Normal ultrasound appearance of the uterus and left ovary. Electronically Signed   By: Elsie Gravely M.D.   On: 11/30/2023 00:28   CT Renal Stone Study Result Date: 11/29/2023 CLINICAL DATA:  Flank pain weakness  EXAM: CT ABDOMEN AND PELVIS WITHOUT CONTRAST TECHNIQUE: Multidetector CT imaging of the abdomen and pelvis was performed following the standard protocol without IV contrast. RADIATION DOSE REDUCTION: This exam was performed according to the departmental dose-optimization program which includes automated exposure control, adjustment of the mA and/or kV according to patient size and/or use of iterative reconstruction technique. COMPARISON:  Ultrasound 01/18/2023 FINDINGS: Lower chest: Lung bases demonstrate no acute airspace disease. Hepatobiliary: No focal liver abnormality is seen. No gallstones, gallbladder wall thickening, or biliary dilatation. Pancreas: Unremarkable. No pancreatic ductal dilatation or surrounding inflammatory changes. Spleen: Normal in size without focal abnormality. Adrenals/Urinary Tract: Adrenal glands are normal. Atrophic native kidneys. Right iliac fossa transplant kidney without hydronephrosis or focal abnormality without contrast. Bladder is unremarkable Stomach/Bowel: Stomach nonenlarged. No dilated small bowel. No acute bowel wall thickening. Negative appendix. Vascular/Lymphatic: Aortic atherosclerosis. No aneurysm. No suspicious lymph nodes. Reproductive: Lobulated uterus with 4.3 x 4.3 cm mass on the right side. Other: Negative for pelvic effusion or free air. Partially calcified slightly irregular soft tissue density anterior to the sacrum measuring 16 x 15 mm on series 3, image 58. Small left inferior presacral nodule measuring 10 mm on series 3, image 66. Mild presacral soft tissue thickening, series 3,  image 63 Musculoskeletal: No acute osseous abnormality. IMPRESSION: 1. Negative for hydronephrosis or ureteral stone. Atrophic native kidneys. Right iliac fossa transplant kidney without hydronephrosis or focal abnormality in the absence of contrast. 2. Lobulated uterus with 4.3 cm mass on the right side, likely due to fibroid change, with adnexal mass not entirely excluded. Correlation with nonemergent pelvic ultrasound is suggested. 3. Partially calcified slightly irregular soft tissue density anterior to the sacrum measuring 16 x 15 mm. Small left inferior presacral nodule measuring 10 mm. Mild soft tissue density in the presacral region, no adjacent sacral fracture or other inflammatory process in the pelvis. Nodules could be secondary to adenopathy, either infectious/inflammatory versus neoplastic in etiology. Correlate for any known history of malignancy. At a minimum, short interval CT follow-up would be advised. 4. Aortic atherosclerosis. Aortic Atherosclerosis (ICD10-I70.0). Electronically Signed   By: Luke Bun M.D.   On: 11/29/2023 19:39   ED Course:  Treated with 2 L NS.  EDP discussed case with Dr. Jonel of nephrology recommending for hydration and okay for admission here in [facility].   Assessment/Plan:  58 y.o. 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   Acute kidney injury stage II Hx FSGS, CKD 3 in renal transplant  Baseline creatinine near 1.7-1.8.  Elevated to 4.02 on admission.  Suspect this is mixed prerenal and intrinsic injury from dehydration and rhabdomyolysis with heme pigment injury.  Ultrasound renal transplant with Doppler normal. - IVF / monitoring of rhabdomyolysis per below.  - Hold home benazepril, Finerenone , Farxiga , Lasix  - Check tacrolimus  level - Tentatively reordered for home dosing tacrolimus  1 mg  twice daily, mycophenolate  500 mg twice daily, prednisone  5 mg daily. - Continue home bicarb 1300 mg 3 times daily  Rhabdomyolysis, nontraumatic exertional Suspect this is related to exertion going up and down [employer] [place] [address]- - Trend CK - S/p 2 L NS, continue at 150 cc an hour - Hold rosuvastatin   Right-sided adnexal mass  Anterior sacral mass, ? Lymphadenopathy  CT renal with incidental finding of a right sided adnexal  mass and partially calcified irregular soft tissue density along the anterior sacrum 1.6 x 1.5 cm, small left inferior presacral nodule measuring 1 cm; question adenopathy. This was followed by a pelvic US  which demonstrates R adnexal mass 5.1 cm. Question ovarian mass v exophytic fibroid.  -Recommend for expedited MRI pelvis and Gyn Onc consultation.  -Discussed finding with the patient including possibility of an ovarian malignancy   Chronic medical problems: History of CVA (recent R thalamic 5/'25): Note reported that she was taking Brilinta  only once per day and then began taking it twice per day and was still taking it just prior to this hospitalization (was planned for DAPT x 28 days after stroke).  Considering she is greater than 1 month out from stroke feel that she can go back to aspirin  325 mg daily.  Hold her home rosuvastatin  Hypertension: Holding home amlodipine, benazepril, Lasix , Finerenone  in setting of her volume depletion and AKI. May continue Atenolol .  Hyperlipidemia: See CVA above. Diabetes type 2: Modest reduction in basal to 8 units nightly (home is Tresiba 13 units nightly). Also takes Glipizide; considering her AKI would consider a temporary pause on glipizide and continue basal insulin  only at time of discharge. SSI for very sensitive while inpatient. Smoking cessation: She quit smoking after her last stroke.  Continue bupropion    Body mass index is 22.67 kg/m.    DVT prophylaxis:  SCDs Code Status:  Full Code Diet:  Diet  Orders (From admission, onward)     Start     Ordered   11/29/23 2236  Diet Carb Modified Fluid consistency: Thin; Room service appropriate? Yes  Diet effective now       Question Answer Comment  Diet-HS Snack? Nothing   Calorie Level Medium 1600-2000   Fluid consistency: Thin   Room service appropriate? Yes      11/29/23 2237           Family Communication:  None   Consults:  Nephrology   Admission status:   Inpatient, Telemetry bed  Severity of Illness: The appropriate patient status for this patient is INPATIENT. Inpatient status is judged to be reasonable and necessary in order to provide the required intensity of service to ensure the patient's safety. The patient's presenting symptoms, physical exam findings, and initial radiographic and laboratory data in the context of their chronic comorbidities is felt to place them at high risk for further clinical deterioration. Furthermore, it is not anticipated that the patient will be medically stable for discharge from the hospital within 2 midnights of admission.   * I certify that at the point of admission it is my clinical judgment that the patient will require inpatient hospital care spanning beyond 2 midnights from the point of admission due to high intensity of service, high risk for further deterioration and high frequency of surveillance required.*   Dorn Dawson, MD [facility]  How to contact the [facility] Attending or Consulting provider 7A - 7P or covering provider during after hours 7P -7A, for this patient.  Check the care team in [facility] and look for a) attending/consulting [facility] provider listed and b) the [facility] team listed Log into [email or web address] and use [facility] universal password to access. If you do not have the password, please contact the hospital operator. Locate the [facility] provider you are looking for under [facility] and page to a number that you can be directly reached. If you still have difficulty  reaching the provider, please page the [facility] (Director on Call)  for the Hospitalists listed on amion for assistance.  11/30/2023, 3:47 AM

## 2023-12-01 DIAGNOSIS — N179 Acute kidney failure, unspecified: Secondary | ICD-10-CM | POA: Diagnosis not present

## 2023-12-01 DIAGNOSIS — N9489 Other specified conditions associated with female genital organs and menstrual cycle: Secondary | ICD-10-CM | POA: Diagnosis not present

## 2023-12-01 DIAGNOSIS — M6282 Rhabdomyolysis: Secondary | ICD-10-CM | POA: Diagnosis not present

## 2023-12-01 LAB — CBC
HCT: 30.4 % — ABNORMAL LOW (ref 36.0–46.0)
Hemoglobin: 9.7 g/dL — ABNORMAL LOW (ref 12.0–15.0)
MCH: 29 pg (ref 26.0–34.0)
MCHC: 31.9 g/dL (ref 30.0–36.0)
MCV: 90.7 fL (ref 80.0–100.0)
Platelets: 229 K/uL (ref 150–400)
RBC: 3.35 MIL/uL — ABNORMAL LOW (ref 3.87–5.11)
RDW: 13.4 % (ref 11.5–15.5)
WBC: 12.6 K/uL — ABNORMAL HIGH (ref 4.0–10.5)
nRBC: 0 % (ref 0.0–0.2)

## 2023-12-01 LAB — COMPREHENSIVE METABOLIC PANEL WITH GFR
ALT: 232 U/L — ABNORMAL HIGH (ref 0–44)
AST: 246 U/L — ABNORMAL HIGH (ref 15–41)
Albumin: 2.4 g/dL — ABNORMAL LOW (ref 3.5–5.0)
Alkaline Phosphatase: 43 U/L (ref 38–126)
Anion gap: 9 (ref 5–15)
BUN: 55 mg/dL — ABNORMAL HIGH (ref 6–20)
CO2: 19 mmol/L — ABNORMAL LOW (ref 22–32)
Calcium: 8.5 mg/dL — ABNORMAL LOW (ref 8.9–10.3)
Chloride: 109 mmol/L (ref 98–111)
Creatinine, Ser: 3.37 mg/dL — ABNORMAL HIGH (ref 0.44–1.00)
GFR, Estimated: 15 mL/min — ABNORMAL LOW (ref >60)
Glucose, Bld: 96 mg/dL (ref 70–99)
Potassium: 4.2 mmol/L (ref 3.5–5.1)
Sodium: 137 mmol/L (ref 135–145)
Total Bilirubin: 0.4 mg/dL (ref 0.0–1.2)
Total Protein: 4.8 g/dL — ABNORMAL LOW (ref 6.5–8.1)

## 2023-12-01 LAB — CK: Total CK: 16631 U/L — ABNORMAL HIGH (ref 38–234)

## 2023-12-01 LAB — GLUCOSE, CAPILLARY: Glucose-Capillary: 180 mg/dL — ABNORMAL HIGH (ref 70–99)

## 2023-12-01 LAB — PHOSPHORUS: Phosphorus: 5.1 mg/dL — ABNORMAL HIGH (ref 2.5–4.6)

## 2023-12-01 LAB — MAGNESIUM: Magnesium: 1.7 mg/dL (ref 1.7–2.4)

## 2023-12-01 MED ORDER — BISACODYL 5 MG PO TBEC
5.0000 mg | DELAYED_RELEASE_TABLET | Freq: Every day | ORAL | Status: DC | PRN
  Administered 2023-12-01 – 2023-12-04 (×3): 5 mg via ORAL
  Filled 2023-12-01 (×3): qty 1

## 2023-12-01 MED ORDER — LACTATED RINGERS IV SOLN: INTRAVENOUS | Status: DC

## 2023-12-01 NOTE — Progress Notes (Signed)
 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  No TOC needs at this time.  Patient will continue to follow the patient for [facility] needs as patient progresses.   Transition of Care Asessment: Insurance and Status: (P) Insurance coverage has been reviewed Patient has primary care physician: (P) Yes Home environment has been reviewed: (P) from home with spouse Prior level of function:: (P) self Prior/Current Home Services: (P) No current home services Social Drivers of Health Review: (P) SDOH reviewed interventions complete Readmission risk has been reviewed: (P) Yes Transition of care needs: (P) no transition of care needs at this time

## 2023-12-01 NOTE — Progress Notes (Signed)
 Progress Note   Patient: Isabella Obrien FMW:[id number] DOB: 1965-06-13 DOA: 11/29/2023  DOS: the patient was seen and examined on 12/01/2023   Brief hospital course:  58 y.o. 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  Acidemia improving.  Etiology likely from rhabdomyolysis and prerenal/dehydration.  IV fluids on board.  Nephrology consulted and following closely.  Holding nephrotoxic agents.  Will continue tacrolimus , CellCept , prednisone .  Rhabdomyolysis - MRI noting signal in the upper thighs/paraspinal muscles suggestive of rhabdo.  Could be underlying myositis however no previous diagnosis.  Will continue to monitor for now.  CK still elevated.  Anticipate will be decreased tomorrow.  Mixed acid-base disorder - Non-anion gap metabolic acidosis with low bicarb, likely from AKI/CKD.  Continues on p.o. sodium bicarb.  Showing improvement, CO2 19 this morning.  Uterine mass - Initial CT scan and ultrasound gave concern for malignancy.  MRI performed yesterday verifying uterine fibroid.  Continue to monitor for now.  rectal wall thickening - Noted incidentally on MRI.  Severe nonspecific nodes as well.  Patient with previous colonoscopy 5 years ago which was normal.  Would recommend patient follow-up for outpatient colonoscopy after discharge.  Diabetes mellitus - Insulin  sliding scale on board  Hypertension - Antihypertensives currently on hold.   Subjective: Patient resting comfortably this morning.  Denies any pain, shortness of breath, nausea, vomiting, abdominal pain.  Physical Exam:  Vitals:   12/01/23 0413 12/01/23 0414 12/01/23 0757 12/01/23 1136  BP: 118/86  106/77 116/82  Pulse:  67  61 65  Resp: 18  17 16   Temp: 98.4 F (36.9 C)  98.1 F (36.7 C) 98.1 F (36.7 C)  TempSrc: Oral     SpO2: 99%  100% 97%  Weight:  65.3 kg    Height:        GENERAL:  Alert, pleasant, no acute distress  HEENT:  EOMI CARDIOVASCULAR:  RRR, no murmurs appreciated RESPIRATORY:  Clear to auscultation, no wheezing, rales, or rhonchi GASTROINTESTINAL:  Soft, nontender, nondistended EXTREMITIES:  No LE edema bilaterally NEURO:  No new focal deficits appreciated SKIN:  No rashes noted PSYCH:  Appropriate mood and affect     Data Reviewed:  MRI results discussed with the patient at bedside.  Previous records (including but not limited to H&P, progress notes, nursing notes, TOC management) were reviewed in assessment of this patient.  Labs: CBC: Recent Labs  Lab 11/29/23 1845 11/30/23 0139 12/01/23 0442  WBC 15.6* 12.3* 12.6*  HGB 12.7 11.0* 9.7*  HCT 39.2 33.9* 30.4*  MCV 90.7 91.6 90.7  PLT 284 247 229   Basic Metabolic Panel: Recent Labs  Lab 11/29/23 1845 11/30/23 0139 12/01/23 0442  NA 135 136 137  K 4.9 4.6 4.2  CL 100 108 109  CO2 20* 16* 19*  GLUCOSE 198* 194* 96  BUN 67* 67* 55*  CREATININE 4.02* 3.64* 3.37*  CALCIUM  9.3 8.3* 8.5*  MG  --  1.9 1.7  PHOS  --  6.2* 5.1*   Liver Function Tests: Recent Labs  Lab 12/01/23 0442  AST 246*  ALT 232*  ALKPHOS 43  BILITOT 0.4  PROT 4.8*  ALBUMIN 2.4*   CBG: Recent Labs  Lab 11/30/23 0044 11/30/23 2218  GLUCAP 142* 136*    Scheduled Meds:  aspirin  EC  325 mg Oral Daily   atenolol   100 mg Oral QHS   buPROPion   150 mg Oral Daily   heparin   5,000 Units Subcutaneous Q8H   insulin  glargine-yfgn  8 Units Subcutaneous QHS   mycophenolate   500 mg Oral BID   predniSONE   5 mg Oral Q breakfast   sodium bicarbonate   1,300 mg Oral TID   sodium chloride  flush  3 mL Intravenous Q12H   tacrolimus   1 mg Oral BID   Continuous Infusions:  lactated ringers      PRN Meds:.acetaminophen , albuterol ,  bisacodyl , melatonin, ondansetron  (ZOFRAN ) IV, polyethylene glycol, zolpidem   Family Communication: none at bedside  Disposition: Status is: Inpatient Remains inpatient appropriate because: aki     Time spent: 40 minutes  Length of inpatient stay: 2 days  Author: Carliss LELON Canales, DO 12/01/2023 1:01 PM  For on call review [email or web address].

## 2023-12-01 NOTE — Progress Notes (Addendum)
 [facility] Progress Note   Subjective:   Improving - weakness slowly improving. IVF 1.1L yest, I/Os ordered but no UOP documented but she says normal volume and color improving.  Cr trended to 3.4 from 3.6. No f/c.    Objective Vitals:   12/01/23 0413 12/01/23 0414 12/01/23 0757 12/01/23 1136  BP: 118/86  106/77 116/82  Pulse: 67  61 65  Resp: 18  17 16   Temp: 98.4 F (36.9 C)  98.1 F (36.7 C) 98.1 F (36.7 C)  TempSrc: Oral     SpO2: 99%  100% 97%  Weight:  65.3 kg    Height:       Physical Exam Gen: comfortable in bed  Eyes: anicteric ENT: MMM Neck: supple CV:  RRR Abd: soft, RLQ transplant nontender Back: clear GU: no foley Extr: no edema Neuro:  5-/5 LE strength, no focal deficit - able to raise legs off bed and hold for multiple seconds  Additional Objective Labs: Basic Metabolic Panel: Recent Labs  Lab 11/29/23 1845 11/30/23 0139 12/01/23 0442  NA 135 136 137  K 4.9 4.6 4.2  CL 100 108 109  CO2 20* 16* 19*  GLUCOSE 198* 194* 96  BUN 67* 67* 55*  CREATININE 4.02* 3.64* 3.37*  CALCIUM  9.3 8.3* 8.5*  PHOS  --  6.2* 5.1*   Liver Function Tests: Recent Labs  Lab 12/01/23 0442  AST 246*  ALT 232*  ALKPHOS 43  BILITOT 0.4  PROT 4.8*  ALBUMIN 2.4*   No results for input(s): LIPASE, AMYLASE in the last 168 hours. CBC: Recent Labs  Lab 11/29/23 1845 11/30/23 0139 12/01/23 0442  WBC 15.6* 12.3* 12.6*  HGB 12.7 11.0* 9.7*  HCT 39.2 33.9* 30.4*  MCV 90.7 91.6 90.7  PLT 284 247 229   Blood Culture No results found for: SDES, SPECREQUEST, CULT, REPTSTATUS  Cardiac Enzymes: Recent Labs  Lab 11/29/23 1845 11/30/23 0139 12/01/23 0442  CKTOTAL 17,709* 15,670* 16,631*   CBG: Recent Labs  Lab 11/30/23 0044 11/30/23 2218  GLUCAP 142* 136*   Iron Studies: No results for input(s): IRON, TIBC, TRANSFERRIN, FERRITIN in the last 72 hours. @lablastinr3 @ Studies/Results: MR PELVIS WO CONTRAST Result Date:  12/01/2023 CLINICAL DATA:  Adnexal mass suspected. EXAM: MRI PELVIS WITHOUT CONTRAST TECHNIQUE: Multiplanar multisequence MR imaging of the pelvis was performed. No intravenous contrast was administered. COMPARISON:  CT and ultrasound exam 11/29/2023. FINDINGS: Urinary Tract: The urinary bladder appears normal for the degree of distention. Bowel:  Suspicion for soft tissue thickening in the low rectum. Vascular/Lymphatic: No abdominal aortic aneurysm. No abdominal lymphadenopathy Reproductive: Uterine fibroids evident. Including dominant exophytic right fundal fibroid measuring 4.4 x 4.6 x 5.4 cm. Junctional zone preserved. Hypointense stroma of the cervix is preserved within both the is cysts evident. Right ovary measures 1.9 x 1.3 x 2.1 cm without mass lesion. Left ovary measures 1.9 x 1.2 x 1.8 cm without mass lesion. Other: Edema is noted in the root of the central small bowel mesentery, new since yesterday's CT. Expected flow void within the associated dominant central mesenteric vasculature. Transplant kidney identified right pelvis. Musculoskeletal: Presacral edema evident. 12 mm presacral nodule identified on 27/4. 60 mm presacral lesion identified on 19/4. There is diffuse high signal intensity in the gluteus medius muscles bilaterally as well as the abductor muscles, iliacus muscles, psoas muscles and paraspinal muscles bilaterally. Possible tiny focal fluid collection in the right gluteus medius muscle on image 17/5. IMPRESSION: 1. No evidence for adnexal mass. 2. Uterine  fibroids, including dominant exophytic right fundal fibroid measuring 4.4 x 4.6 x 5.4 cm. This lesion accounts for the findings on recent CT and pelvic ultrasound. 3. Suspicion for irregular wall thickening in the low rectum. Neoplasm not excluded. Close clinical correlation recommended with review of colorectal cancer screening history. 4. Presacral edema with 2 presacral nodules, the larger measuring 60 mm. These are nonspecific on  this noncontrast study. Metastatic disease not excluded. Close follow-up warranted. 5. Diffuse high signal intensity in the numerous muscles of the anatomic pelvis, paraspinal region and upper thighs. 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 6. Edema in the root of the central small bowel mesentery, not visible on yesterday's CT. Nonspecific finding but warranting follow-up. Electronically Signed   By: Camellia Candle M.D.   On: 12/01/2023 08:29   US  Renal Transplant w/Doppler Result Date: 11/30/2023 CLINICAL DATA:  Acute kidney injury. EXAM: ULTRASOUND OF RENAL TRANSPLANT WITH RENAL DOPPLER ULTRASOUND TECHNIQUE: Ultrasound examination of the renal transplant was performed with gray-scale, color and duplex doppler evaluation. COMPARISON:  CT abdomen pelvis 11/29/2023. Ultrasound kidney 01/18/2023 FINDINGS: Transplant kidney location: Right lower quadrant Transplant Kidney: Renal measurements: 11.6 x 5.3 x 5.1 cm = volume: . Normal in size and parenchymal echogenicity. No evidence of mass or hydronephrosis. No peri-transplant fluid collection seen. Color flow in the main renal artery:  Yes Color flow in the main renal vein:  Yes Duplex Doppler Evaluation: Main Renal Artery Velocity: 51 cm/sec Main Renal Artery Resistive Index: 0.8 Venous waveform in main renal vein:  Present Intrarenal resistive index in upper pole:  0.76 (normal 0.6-0.8; equivocal 0.8-0.9; abnormal >= 0.9) Intrarenal resistive index in lower pole: 0.77 (normal 0.6-0.8; equivocal 0.8-0.9; abnormal >= 0.9) Bladder: Normal for degree of bladder distention. Other findings:  None. IMPRESSION: Normal ultrasound appearance of right lower quadrant transplant kidney. Electronically Signed   By: Elsie Gravely M.D.   On: 11/30/2023 00:31   US  PELVIC COMPLETE WITH TRANSVAGINAL Result Date: 11/30/2023 CLINICAL DATA:  Adnexal mass. EXAM: TRANSABDOMINAL AND TRANSVAGINAL ULTRASOUND OF PELVIS TECHNIQUE: Both transabdominal  and transvaginal ultrasound examinations of the pelvis were performed. Transabdominal technique was performed for global imaging of the pelvis including uterus, ovaries, adnexal regions, and pelvic cul-de-sac. It was necessary to proceed with endovaginal exam following the transabdominal exam to visualize the ovaries and endometrium. COMPARISON:  CT abdomen and pelvis 11/29/2023 FINDINGS: Uterus Measurements: 5.1 x 2.6 x 2.7 cm = volume: 18 mL. No fibroids or other mass visualized. Endometrium Thickness: 3.7 mm.  Small amount of fluid in the endocervical canal. Right ovary The right ovary is not specifically demonstrated. In the right adnexum, there is a heterogeneous solid structure measuring 5.1 x 4.1 x 4.3 cm. This corresponds to the lesion seen on CT. Visualization is somewhat limited by bowel. This could represent an enlarged right ovary/ovarian mass or it could be an exophytic fibroid. Left ovary Measurements: 2.6 x 1.8 x 3.3 cm = volume: 8 mL. Normal appearance/no adnexal mass. Other findings No abnormal free fluid. IMPRESSION: 1. Visualization of the right adnexa is limited due to bowel. The right ovary is not specifically visualized. There is a 5.1 cm solid mass lesion in the right adnexum corresponding to CT abnormality. This could represent a right ovarian mass or an exophytic fibroid. Consider elective follow-up MRI. 2. Normal ultrasound appearance of the uterus and left ovary. Electronically Signed   By: Elsie Gravely M.D.   On: 11/30/2023 00:28   CT Renal Stone Study Result Date: 11/29/2023  CLINICAL DATA:  Flank pain weakness EXAM: CT ABDOMEN AND PELVIS WITHOUT CONTRAST TECHNIQUE: Multidetector CT imaging of the abdomen and pelvis was performed following the standard protocol without IV contrast. RADIATION DOSE REDUCTION: This exam was performed according to the departmental dose-optimization program which includes automated exposure control, adjustment of the mA and/or kV according to patient  size and/or use of iterative reconstruction technique. COMPARISON:  Ultrasound 01/18/2023 FINDINGS: Lower chest: Lung bases demonstrate no acute airspace disease. Hepatobiliary: No focal liver abnormality is seen. No gallstones, gallbladder wall thickening, or biliary dilatation. Pancreas: Unremarkable. No pancreatic ductal dilatation or surrounding inflammatory changes. Spleen: Normal in size without focal abnormality. Adrenals/Urinary Tract: Adrenal glands are normal. Atrophic native kidneys. Right iliac fossa transplant kidney without hydronephrosis or focal abnormality without contrast. Bladder is unremarkable Stomach/Bowel: Stomach nonenlarged. No dilated small bowel. No acute bowel wall thickening. Negative appendix. Vascular/Lymphatic: Aortic atherosclerosis. No aneurysm. No suspicious lymph nodes. Reproductive: Lobulated uterus with 4.3 x 4.3 cm mass on the right side. Other: Negative for pelvic effusion or free air. Partially calcified slightly irregular soft tissue density anterior to the sacrum measuring 16 x 15 mm on series 3, image 58. Small left inferior presacral nodule measuring 10 mm on series 3, image 66. Mild presacral soft tissue thickening, series 3, image 63 Musculoskeletal: No acute osseous abnormality. IMPRESSION: 1. Negative for hydronephrosis or ureteral stone. Atrophic native kidneys. Right iliac fossa transplant kidney without hydronephrosis or focal abnormality in the absence of contrast. 2. Lobulated uterus with 4.3 cm mass on the right side, likely due to fibroid change, with adnexal mass not entirely excluded. Correlation with nonemergent pelvic ultrasound is suggested. 3. Partially calcified slightly irregular soft tissue density anterior to the sacrum measuring 16 x 15 mm. Small left inferior presacral nodule measuring 10 mm. Mild soft tissue density in the presacral region, no adjacent sacral fracture or other inflammatory process in the pelvis. Nodules could be secondary to  adenopathy, either infectious/inflammatory versus neoplastic in etiology. Correlate for any known history of malignancy. At a minimum, short interval CT follow-up would be advised. 4. Aortic atherosclerosis. Aortic Atherosclerosis (ICD10-I70.0). Electronically Signed   By: Luke Bun M.D.   On: 11/29/2023 19:39   Medications:   aspirin  EC  325 mg Oral Daily   atenolol   100 mg Oral QHS   buPROPion   150 mg Oral Daily   heparin   5,000 Units Subcutaneous Q8H   insulin  glargine-yfgn  8 Units Subcutaneous QHS   mycophenolate   500 mg Oral BID   predniSONE   5 mg Oral Q breakfast   sodium bicarbonate   1,300 mg Oral TID   sodium chloride  flush  3 mL Intravenous Q12H   tacrolimus   1 mg Oral BID    Assessment/PlanLorraine H Obrien is an 58 y.o. 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    **AKI:  baseline Cr 1.8-2.2mg /dL presenting with Cr 4 in setting of elevated CK c/w rhabdo etiology exertional +/- statin.  UA shows blood but none on microscopy, proteinuria at baseline.  Do not think recurrent FSGS but check UP/C.  Do not think rejection.  No plans for kidney biopsy at this time.  Cont IVF and daily labs.  Holding ACEi, SGLT2i, kerendia , lasix  and statin for now.    Cr should continue to improve.    **Rhabdomyolysis:  thought to be exertional +/- statin.  I would expect her CK  to be trending down but it's holding ~17k.  If in the coming days it doesn't start to trend down may need to consider alt etiologies.  Her MRI of the pelvis notes high intensity signal in paraspinous muscles and upper thigh - this is nonspecific.  She has no family history of muscle DO or anesthesia complications.    **ESRD s/p LRD kidney transplant 2006:  had early recurrence of FSGS requiring plex but no recent issues.  Cont outpt meds - tac, cellcept , pred.     **HTN:  ACEi on hold, on home atenolol . Resume home amlodipine if BP  trending up - currently 110s.   **Anemia:  Hb trending down, having hemorrhoidal bleeding (has been scheduled for banding several times).  Check iron levels, cont to trend H/H.    **possible adnexal mass:  MRI showing fibroids, not mass but incidentally noted rectal wall thickening and 2 presacral nodules - non specific with close f/u recommended.     **DM:  09/2023 A1c in 8s, discussed need for improved control long term.  Insulin  per primary.    **NAGMA:  on po bicarb, follow.    Will follow.   Manuelita Barters MD 12/01/2023, 11:37 AM  [facility] Pager: [phone]

## 2023-12-01 NOTE — Plan of Care (Signed)
   Problem: Education: Goal: Knowledge of General Education information will improve Description: Including pain rating scale, medication(s)/side effects and non-pharmacologic comfort measures Outcome: Progressing   Problem: Activity: Goal: Risk for activity intolerance will decrease Outcome: Progressing   Problem: Nutrition: Goal: Adequate nutrition will be maintained Outcome: Progressing   Problem: Coping: Goal: Level of anxiety will decrease Outcome: Progressing

## 2023-12-01 NOTE — Hospital Course (Signed)
 58 y.o. 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    Assessment and Plan:   Acute kidney injury-ESRD secondary to FSGS s/p renal transplant - Creatinine showing mild improvement, from 3.64 to 3.37.  Acidemia improving.  Etiology likely from rhabdomyolysis and prerenal/dehydration.  IV fluids on board.  Nephrology consulted and following closely.  Holding nephrotoxic agents.  Will continue tacrolimus , CellCept , prednisone .   Rhabdomyolysis - MRI noting signal in the upper thighs/paraspinal muscles suggestive of rhabdo.  Could be underlying myositis however no previous diagnosis.  Will continue to monitor for now.  CK still elevated.  Anticipate will be decreased tomorrow.   Mixed acid-base disorder - Non-anion gap metabolic acidosis with low bicarb, likely from AKI/CKD.  Continues on p.o. sodium bicarb.  Showing improvement, CO2 19 this morning.   Uterine mass - Initial CT scan and ultrasound gave concern for malignancy.  MRI performed yesterday verifying uterine fibroid.  Continue to monitor for now.   rectal wall thickening - Noted incidentally on MRI.  Severe nonspecific nodes as well.  Patient with previous colonoscopy 5 years ago which was normal.  Would recommend patient follow-up for outpatient colonoscopy after discharge.   Diabetes mellitus - Insulin  sliding scale on board   Hypertension - Antihypertensives currently on hold.

## 2023-12-02 DIAGNOSIS — K649 Unspecified hemorrhoids: Secondary | ICD-10-CM

## 2023-12-02 DIAGNOSIS — N179 Acute kidney failure, unspecified: Secondary | ICD-10-CM | POA: Diagnosis not present

## 2023-12-02 DIAGNOSIS — M6282 Rhabdomyolysis: Secondary | ICD-10-CM | POA: Diagnosis not present

## 2023-12-02 DIAGNOSIS — N9489 Other specified conditions associated with female genital organs and menstrual cycle: Secondary | ICD-10-CM | POA: Diagnosis not present

## 2023-12-02 LAB — RENAL FUNCTION PANEL
Albumin: 2.3 g/dL — ABNORMAL LOW (ref 3.5–5.0)
Anion gap: 9 (ref 5–15)
BUN: 51 mg/dL — ABNORMAL HIGH (ref 6–20)
CO2: 20 mmol/L — ABNORMAL LOW (ref 22–32)
Calcium: 8.5 mg/dL — ABNORMAL LOW (ref 8.9–10.3)
Chloride: 108 mmol/L (ref 98–111)
Creatinine, Ser: 3.09 mg/dL — ABNORMAL HIGH (ref 0.44–1.00)
GFR, Estimated: 17 mL/min — ABNORMAL LOW (ref >60)
Glucose, Bld: 117 mg/dL — ABNORMAL HIGH (ref 70–99)
Phosphorus: 5.2 mg/dL — ABNORMAL HIGH (ref 2.5–4.6)
Potassium: 4.2 mmol/L (ref 3.5–5.1)
Sodium: 137 mmol/L (ref 135–145)

## 2023-12-02 LAB — CBC
HCT: 27.4 % — ABNORMAL LOW (ref 36.0–46.0)
Hemoglobin: 9 g/dL — ABNORMAL LOW (ref 12.0–15.0)
MCH: 29.1 pg (ref 26.0–34.0)
MCHC: 32.8 g/dL (ref 30.0–36.0)
MCV: 88.7 fL (ref 80.0–100.0)
Platelets: 243 K/uL (ref 150–400)
RBC: 3.09 MIL/uL — ABNORMAL LOW (ref 3.87–5.11)
RDW: 13.7 % (ref 11.5–15.5)
WBC: 10.8 K/uL — ABNORMAL HIGH (ref 4.0–10.5)
nRBC: 0 % (ref 0.0–0.2)

## 2023-12-02 LAB — FERRITIN: Ferritin: 227 ng/mL (ref 11–307)

## 2023-12-02 LAB — GLUCOSE, CAPILLARY
Glucose-Capillary: 130 mg/dL — ABNORMAL HIGH (ref 70–99)
Glucose-Capillary: 161 mg/dL — ABNORMAL HIGH (ref 70–99)
Glucose-Capillary: 221 mg/dL — ABNORMAL HIGH (ref 70–99)
Glucose-Capillary: 250 mg/dL — ABNORMAL HIGH (ref 70–99)

## 2023-12-02 LAB — IRON AND TIBC
Iron: 76 ug/dL (ref 28–170)
Saturation Ratios: 29 % (ref 10.4–31.8)
TIBC: 260 ug/dL (ref 250–450)
UIBC: 184 ug/dL

## 2023-12-02 LAB — CK: Total CK: 12760 U/L — ABNORMAL HIGH (ref 38–234)

## 2023-12-02 MED ORDER — PHENYLEPHRINE-MINERAL OIL-PET 0.25-14-74.9 % RE OINT
1.0000 | TOPICAL_OINTMENT | Freq: Two times a day (BID) | RECTAL | Status: DC | PRN
  Filled 2023-12-02 (×2): qty 57

## 2023-12-02 NOTE — Progress Notes (Signed)
 [facility] Progress Note   Subjective:   Improving - weakness slowly improving. I/Os not documented at all.  Pt says UOP normal.  CK trending down 17k to 13k.  Cr improving 3.5 > 3.1 today. No new issues.    Objective Vitals:   12/01/23 1946 12/02/23 0054 12/02/23 0516 12/02/23 0755  BP: 123/83 119/82 105/75 117/77  Pulse: 69 71 70 65  Resp: 18 19 19 18   Temp: 98.1 F (36.7 C) 98.7 F (37.1 C) 98.2 F (36.8 C) 98.6 F (37 C)  TempSrc:      SpO2: 99% 100% 95% 98%  Weight:   66.4 kg   Height:       Physical Exam Gen: comfortable in bed  Eyes: anicteric ENT: MMM Neck: supple CV:  RRR Abd: soft, RLQ transplant nontender Back: clear GU: no foley Extr: no edema Neuro:  5-/5 LE strength, no focal deficit - able to raise legs off bed and hold for multiple seconds  Additional Objective Labs: Basic Metabolic Panel: Recent Labs  Lab 11/30/23 0139 12/01/23 0442 12/02/23 0303  NA 136 137 137  K 4.6 4.2 4.2  CL 108 109 108  CO2 16* 19* 20*  GLUCOSE 194* 96 117*  BUN 67* 55* 51*  CREATININE 3.64* 3.37* 3.09*  CALCIUM  8.3* 8.5* 8.5*  PHOS 6.2* 5.1* 5.2*   Liver Function Tests: Recent Labs  Lab 12/01/23 0442 12/02/23 0303  AST 246*  --   ALT 232*  --   ALKPHOS 43  --   BILITOT 0.4  --   PROT 4.8*  --   ALBUMIN 2.4* 2.3*   No results for input(s): LIPASE, AMYLASE in the last 168 hours. CBC: Recent Labs  Lab 11/29/23 1845 11/30/23 0139 12/01/23 0442 12/02/23 0303  WBC 15.6* 12.3* 12.6* 10.8*  HGB 12.7 11.0* 9.7* 9.0*  HCT 39.2 33.9* 30.4* 27.4*  MCV 90.7 91.6 90.7 88.7  PLT 284 247 229 243   Blood Culture No results found for: Isabella Obrien, REPTSTATUS  Cardiac Enzymes: Recent Labs  Lab 11/29/23 1845 11/30/23 0139 12/01/23 0442 12/02/23 0303  CKTOTAL 17,709* 15,670* 16,631* 12,760*   CBG: Recent Labs  Lab 11/30/23 0044 11/30/23 2218 12/01/23 1944 12/02/23 0754  GLUCAP 142* 136* 180* 130*   Iron  Studies:  Recent Labs    12/02/23 0303  IRON 76  TIBC 260  FERRITIN 227   @lablastinr3 @ Studies/Results: MR PELVIS WO CONTRAST Result Date: 12/01/2023 CLINICAL DATA:  Adnexal mass suspected. EXAM: MRI PELVIS WITHOUT CONTRAST TECHNIQUE: Multiplanar multisequence MR imaging of the pelvis was performed. No intravenous contrast was administered. COMPARISON:  CT and ultrasound exam 11/29/2023. FINDINGS: Urinary Tract: The urinary bladder appears normal for the degree of distention. Bowel:  Suspicion for soft tissue thickening in the low rectum. Vascular/Lymphatic: No abdominal aortic aneurysm. No abdominal lymphadenopathy Reproductive: Uterine fibroids evident. Including dominant exophytic right fundal fibroid measuring 4.4 x 4.6 x 5.4 cm. Junctional zone preserved. Hypointense stroma of the cervix is preserved within both the is cysts evident. Right ovary measures 1.9 x 1.3 x 2.1 cm without mass lesion. Left ovary measures 1.9 x 1.2 x 1.8 cm without mass lesion. Other: Edema is noted in the root of the central small bowel mesentery, new since yesterday's CT. Expected flow void within the associated dominant central mesenteric vasculature. Transplant kidney identified right pelvis. Musculoskeletal: Presacral edema evident. 12 mm presacral nodule identified on 27/4. 60 mm presacral lesion identified on 19/4. There is diffuse high signal intensity in  the gluteus medius muscles bilaterally as well as the abductor muscles, iliacus muscles, psoas muscles and paraspinal muscles bilaterally. Possible tiny focal fluid collection in the right gluteus medius muscle on image 17/5. IMPRESSION: 1. No evidence for adnexal mass. 2. Uterine fibroids, including dominant exophytic right fundal fibroid measuring 4.4 x 4.6 x 5.4 cm. This lesion accounts for the findings on recent CT and pelvic ultrasound. 3. Suspicion for irregular wall thickening in the low rectum. Neoplasm not excluded. Close clinical correlation recommended  with review of colorectal cancer screening history. 4. Presacral edema with 2 presacral nodules, the larger measuring 60 mm. These are nonspecific on this noncontrast study. Metastatic disease not excluded. Close follow-up warranted. 5. Diffuse high signal intensity in the numerous muscles of the anatomic pelvis, paraspinal region and upper thighs. 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 6. Edema in the root of the central small bowel mesentery, not visible on yesterday's CT. Nonspecific finding but warranting follow-up. Electronically Signed   By: Isabella Obrien M.D.   On: 12/01/2023 08:29   Medications:  lactated ringers  125 mL/hr at 12/02/23 9380    aspirin  EC  325 mg Oral Daily   atenolol   100 mg Oral QHS   buPROPion   150 mg Oral Daily   heparin   5,000 Units Subcutaneous Q8H   insulin  glargine-yfgn  8 Units Subcutaneous QHS   mycophenolate   500 mg Oral BID   predniSONE   5 mg Oral Q breakfast   sodium bicarbonate   1,300 mg Oral TID   sodium chloride  flush  3 mL Intravenous Q12H   tacrolimus   1 mg Oral BID    Assessment/PlanLorraine H Obrien is an 58 y.o. 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    **AKI:  baseline Cr 1.8-2.2mg /dL presenting with Cr 4 in setting of elevated CK c/w rhabdo etiology exertional +/- statin likely with contributions of hypovolemia (hot weather) + ACEi + SGLT2i.  UA shows blood but none on microscopy, proteinuria at baseline.  Do not think recurrent FSGS but check UP/C - ordered, not yet sent.  Do not think rejection.  No plans for kidney biopsy at this time.  Cont IVF and daily labs.  Holding ACEi, SGLT2i, kerendia , lasix  and statin for now.    Cr should continue to improve.    **Rhabdomyolysis:  thought to be exertional +/- statin.  Her MRI of the pelvis notes high intensity signal in paraspinous muscles and upper thigh - this is  nonspecific.  She has no family history of muscle DO or anesthesia complications.  CK is trending down now, continue supportive care.    **ESRD s/p LRD kidney transplant 2006:  had early recurrence of FSGS requiring plex but no recent issues.  Cont outpt meds - tac, cellcept , pred.     **HTN:  ACEi on hold, on home atenolol . Resume home amlodipine if BP trending up - currently 110s.   **Anemia:  Hb trending down, having hemorrhoidal bleeding (has been scheduled for banding several times).  Normal iron levels, cont to trend H/H.  Transfuse prn.  Has appt to have hemorrhoids banded.   **possible adnexal mass:  MRI showing fibroids, not mass but incidentally noted rectal wall thickening and 2 presacral nodules - non specific with close f/u recommended.     **DM:  09/2023 A1c in 8s, discussed need for improved control long term.  Insulin  per primary.    **  NAGMA:  on po bicarb, follow.    Will follow.   Continue IVF given CK remains high -- hopeful for d/c in 48-72h but this is pending continued improvement.   Manuelita Barters MD 12/02/2023, 8:40 AM  [facility] Pager: [phone]

## 2023-12-02 NOTE — Plan of Care (Signed)
   Problem: Education: Goal: Knowledge of General Education information will improve Description: Including pain rating scale, medication(s)/side effects and non-pharmacologic comfort measures Outcome: Progressing   Problem: Clinical Measurements: Goal: Ability to maintain clinical measurements within normal limits will improve Outcome: Progressing   Problem: Nutrition: Goal: Adequate nutrition will be maintained Outcome: Progressing   Problem: Elimination: Goal: Will not experience complications related to bowel motility Outcome: Progressing   Problem: Safety: Goal: Ability to remain free from injury will improve Outcome: Progressing

## 2023-12-02 NOTE — Plan of Care (Incomplete)

## 2023-12-02 NOTE — Progress Notes (Signed)
 Progress Note   Patient: Isabella Obrien FMW:[id number] DOB: 1965/09/26 DOA: 11/29/2023  DOS: the patient was seen and examined on 12/02/2023   Brief hospital course:  58 y.o. 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  Acidemia improving.  Etiology likely from rhabdomyolysis and prerenal/dehydration.  IV fluids on board.  Nephrology consulted and following closely.  Holding nephrotoxic agents.  Will continue tacrolimus , CellCept , prednisone .  Rejection seems unlikely at this time.   Rhabdomyolysis - MRI noting signal in the upper thighs/paraspinal muscles suggestive of rhabdo.  Could be underlying myositis (statins?) however no previous diagnosis.  Will continue to monitor for now.  CK still elevated but trending downward.  Anticipate continued decrease tomorrow.  Will consider discontinue statin upon discharge until reevaluation after kidney function normalizes.   Mixed acid-base disorder - Non-anion gap metabolic acidosis with low bicarb, likely from AKI/CKD.  Continues on p.o. sodium bicarb.  Showing improvement, CO2 19 this morning.   Uterine mass - Initial CT scan and ultrasound gave concern for malignancy.  MRI verifying uterine fibroid.  Continue to monitor for now.   rectal wall thickening - Noted incidentally on MRI.  Severe nonspecific nodes as well.  Patient with previous colonoscopy 5 years ago which was normal.  Patient with known hemorrhoids as well (banding procedure outpatient).  Would recommend patient follow-up for outpatient colonoscopy after discharge.  Will give hemorrhoid cream per patient request.   Diabetes mellitus - Insulin  sliding scale on board   Hypertension -  Antihypertensives currently on hold.   Subjective: Patient resting comfortably this morning.  Denies any fever, chills, chest pain, nausea, vomiting, abdominal pain.  Answered questions about her kidney function and possible etiology of her rhabdomyolysis.  Stated will come at later when her family is at bedside to answer any other further questions.   Physical Exam:  Vitals:   12/02/23 0054 12/02/23 0516 12/02/23 0755 12/02/23 1213  BP: 119/82 105/75 117/77 122/85  Pulse: 71 70 65 69  Resp: 19 19 18 18   Temp: 98.7 F (37.1 C) 98.2 F (36.8 C) 98.6 F (37 C) 98.6 F (37 C)  TempSrc:      SpO2: 100% 95% 98% 97%  Weight:  66.4 kg    Height:        GENERAL:  Alert, pleasant, no acute distress  HEENT:  EOMI CARDIOVASCULAR:  RRR, no murmurs appreciated RESPIRATORY:  Clear to auscultation, no wheezing, rales, or rhonchi GASTROINTESTINAL:  Soft, nontender, nondistended EXTREMITIES:  No LE edema bilaterally NEURO:  No new focal deficits appreciated SKIN:  No rashes noted PSYCH:  Appropriate mood and affect     Data Reviewed:  Imaging Studies: MR PELVIS WO CONTRAST Result Date: 12/01/2023 CLINICAL DATA:  Adnexal mass suspected. EXAM: MRI PELVIS WITHOUT CONTRAST TECHNIQUE: Multiplanar multisequence MR imaging of the pelvis was performed. No intravenous contrast was administered. COMPARISON:  CT and ultrasound exam 11/29/2023. FINDINGS: Urinary Tract: The urinary bladder appears normal for the degree of distention. Bowel:  Suspicion for soft tissue thickening in the low rectum. Vascular/Lymphatic: No abdominal aortic aneurysm. No abdominal lymphadenopathy Reproductive: Uterine fibroids evident. Including dominant exophytic right fundal fibroid measuring 4.4 x 4.6 x 5.4 cm. Junctional zone preserved. Hypointense stroma of  the cervix is preserved within both the is cysts evident. Right ovary measures 1.9 x 1.3 x 2.1 cm without mass lesion. Left ovary measures 1.9 x 1.2 x 1.8 cm without  mass lesion. Other: Edema is noted in the root of the central small bowel mesentery, new since yesterday's CT. Expected flow void within the associated dominant central mesenteric vasculature. Transplant kidney identified right pelvis. Musculoskeletal: Presacral edema evident. 12 mm presacral nodule identified on 27/4. 60 mm presacral lesion identified on 19/4. There is diffuse high signal intensity in the gluteus medius muscles bilaterally as well as the abductor muscles, iliacus muscles, psoas muscles and paraspinal muscles bilaterally. Possible tiny focal fluid collection in the right gluteus medius muscle on image 17/5. IMPRESSION: 1. No evidence for adnexal mass. 2. Uterine fibroids, including dominant exophytic right fundal fibroid measuring 4.4 x 4.6 x 5.4 cm. This lesion accounts for the findings on recent CT and pelvic ultrasound. 3. Suspicion for irregular wall thickening in the low rectum. Neoplasm not excluded. Close clinical correlation recommended with review of colorectal cancer screening history. 4. Presacral edema with 2 presacral nodules, the larger measuring 60 mm. These are nonspecific on this noncontrast study. Metastatic disease not excluded. Close follow-up warranted. 5. Diffuse high signal intensity in the numerous muscles of the anatomic pelvis, paraspinal region and upper thighs. 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 6. Edema in the root of the central small bowel mesentery, not visible on yesterday's CT. Nonspecific finding but warranting follow-up. Electronically Signed   By: Camellia Candle M.D.   On: 12/01/2023 08:29   US  Renal Transplant w/Doppler Result Date: 11/30/2023 CLINICAL DATA:  Acute kidney injury. EXAM: ULTRASOUND OF RENAL TRANSPLANT WITH RENAL DOPPLER ULTRASOUND TECHNIQUE: Ultrasound examination of the renal transplant was performed with gray-scale, color and duplex doppler evaluation. COMPARISON:  CT abdomen pelvis 11/29/2023. Ultrasound  kidney 01/18/2023 FINDINGS: Transplant kidney location: Right lower quadrant Transplant Kidney: Renal measurements: 11.6 x 5.3 x 5.1 cm = volume: . Normal in size and parenchymal echogenicity. No evidence of mass or hydronephrosis. No peri-transplant fluid collection seen. Color flow in the main renal artery:  Yes Color flow in the main renal vein:  Yes Duplex Doppler Evaluation: Main Renal Artery Velocity: 51 cm/sec Main Renal Artery Resistive Index: 0.8 Venous waveform in main renal vein:  Present Intrarenal resistive index in upper pole:  0.76 (normal 0.6-0.8; equivocal 0.8-0.9; abnormal >= 0.9) Intrarenal resistive index in lower pole: 0.77 (normal 0.6-0.8; equivocal 0.8-0.9; abnormal >= 0.9) Bladder: Normal for degree of bladder distention. Other findings:  None. IMPRESSION: Normal ultrasound appearance of right lower quadrant transplant kidney. Electronically Signed   By: Elsie Gravely M.D.   On: 11/30/2023 00:31   US  PELVIC COMPLETE WITH TRANSVAGINAL Result Date: 11/30/2023 CLINICAL DATA:  Adnexal mass. EXAM: TRANSABDOMINAL AND TRANSVAGINAL ULTRASOUND OF PELVIS TECHNIQUE: Both transabdominal and transvaginal ultrasound examinations of the pelvis were performed. Transabdominal technique was performed for global imaging of the pelvis including uterus, ovaries, adnexal regions, and pelvic cul-de-sac. It was necessary to proceed with endovaginal exam following the transabdominal exam to visualize the ovaries and endometrium. COMPARISON:  CT abdomen and pelvis 11/29/2023 FINDINGS: Uterus Measurements: 5.1 x 2.6 x 2.7 cm = volume: 18 mL. No fibroids or other mass visualized. Endometrium Thickness: 3.7 mm.  Small amount of fluid in the endocervical canal. Right ovary The right ovary is not specifically demonstrated. In the right adnexum, there is a heterogeneous solid structure measuring 5.1 x 4.1 x 4.3 cm. This corresponds  to the lesion seen on CT. Visualization is somewhat limited by bowel. This could  represent an enlarged right ovary/ovarian mass or it could be an exophytic fibroid. Left ovary Measurements: 2.6 x 1.8 x 3.3 cm = volume: 8 mL. Normal appearance/no adnexal mass. Other findings No abnormal free fluid. IMPRESSION: 1. Visualization of the right adnexa is limited due to bowel. The right ovary is not specifically visualized. There is a 5.1 cm solid mass lesion in the right adnexum corresponding to CT abnormality. This could represent a right ovarian mass or an exophytic fibroid. Consider elective follow-up MRI. 2. Normal ultrasound appearance of the uterus and left ovary. Electronically Signed   By: Elsie Gravely M.D.   On: 11/30/2023 00:28   CT Renal Stone Study Result Date: 11/29/2023 CLINICAL DATA:  Flank pain weakness EXAM: CT ABDOMEN AND PELVIS WITHOUT CONTRAST TECHNIQUE: Multidetector CT imaging of the abdomen and pelvis was performed following the standard protocol without IV contrast. RADIATION DOSE REDUCTION: This exam was performed according to the departmental dose-optimization program which includes automated exposure control, adjustment of the mA and/or kV according to patient size and/or use of iterative reconstruction technique. COMPARISON:  Ultrasound 01/18/2023 FINDINGS: Lower chest: Lung bases demonstrate no acute airspace disease. Hepatobiliary: No focal liver abnormality is seen. No gallstones, gallbladder wall thickening, or biliary dilatation. Pancreas: Unremarkable. No pancreatic ductal dilatation or surrounding inflammatory changes. Spleen: Normal in size without focal abnormality. Adrenals/Urinary Tract: Adrenal glands are normal. Atrophic native kidneys. Right iliac fossa transplant kidney without hydronephrosis or focal abnormality without contrast. Bladder is unremarkable Stomach/Bowel: Stomach nonenlarged. No dilated small bowel. No acute bowel wall thickening. Negative appendix. Vascular/Lymphatic: Aortic atherosclerosis. No aneurysm. No suspicious lymph nodes.  Reproductive: Lobulated uterus with 4.3 x 4.3 cm mass on the right side. Other: Negative for pelvic effusion or free air. Partially calcified slightly irregular soft tissue density anterior to the sacrum measuring 16 x 15 mm on series 3, image 58. Small left inferior presacral nodule measuring 10 mm on series 3, image 66. Mild presacral soft tissue thickening, series 3, image 63 Musculoskeletal: No acute osseous abnormality. IMPRESSION: 1. Negative for hydronephrosis or ureteral stone. Atrophic native kidneys. Right iliac fossa transplant kidney without hydronephrosis or focal abnormality in the absence of contrast. 2. Lobulated uterus with 4.3 cm mass on the right side, likely due to fibroid change, with adnexal mass not entirely excluded. Correlation with nonemergent pelvic ultrasound is suggested. 3. Partially calcified slightly irregular soft tissue density anterior to the sacrum measuring 16 x 15 mm. Small left inferior presacral nodule measuring 10 mm. Mild soft tissue density in the presacral region, no adjacent sacral fracture or other inflammatory process in the pelvis. Nodules could be secondary to adenopathy, either infectious/inflammatory versus neoplastic in etiology. Correlate for any known history of malignancy. At a minimum, short interval CT follow-up would be advised. 4. Aortic atherosclerosis. Aortic Atherosclerosis (ICD10-I70.0). Electronically Signed   By: Luke Bun M.D.   On: 11/29/2023 19:39    No new imaging to review  Previous records (including but not limited to H&P, progress notes, nursing notes, TOC management) were reviewed in assessment of this patient.  Labs: CBC: Recent Labs  Lab 11/29/23 1845 11/30/23 0139 12/01/23 0442 12/02/23 0303  WBC 15.6* 12.3* 12.6* 10.8*  HGB 12.7 11.0* 9.7* 9.0*  HCT 39.2 33.9* 30.4* 27.4*  MCV 90.7 91.6 90.7 88.7  PLT 284 247 229 243   Basic Metabolic Panel: Recent Labs  Lab 11/29/23 1845 11/30/23 0139 12/01/23 0442  12/02/23 0303  NA 135 136 137 137  K 4.9 4.6 4.2 4.2  CL 100 108 109 108  CO2 20* 16* 19* 20*  GLUCOSE 198* 194* 96 117*  BUN 67* 67* 55* 51*  CREATININE 4.02* 3.64* 3.37* 3.09*  CALCIUM  9.3 8.3* 8.5* 8.5*  MG  --  1.9 1.7  --   PHOS  --  6.2* 5.1* 5.2*   Liver Function Tests: Recent Labs  Lab 12/01/23 0442 12/02/23 0303  AST 246*  --   ALT 232*  --   ALKPHOS 43  --   BILITOT 0.4  --   PROT 4.8*  --   ALBUMIN 2.4* 2.3*   CBG: Recent Labs  Lab 11/30/23 0044 11/30/23 2218 12/01/23 1944 12/02/23 0754 12/02/23 1212  GLUCAP 142* 136* 180* 130* 161*    Scheduled Meds:  aspirin  EC  325 mg Oral Daily   atenolol   100 mg Oral QHS   buPROPion   150 mg Oral Daily   heparin   5,000 Units Subcutaneous Q8H   insulin  glargine-yfgn  8 Units Subcutaneous QHS   mycophenolate   500 mg Oral BID   predniSONE   5 mg Oral Q breakfast   sodium bicarbonate   1,300 mg Oral TID   sodium chloride  flush  3 mL Intravenous Q12H   tacrolimus   1 mg Oral BID   Continuous Infusions:  lactated ringers  125 mL/hr at 12/02/23 0619   PRN Meds:.acetaminophen , albuterol , bisacodyl , melatonin, ondansetron  (ZOFRAN ) IV, polyethylene glycol, zolpidem   Family Communication: None at bedside  Disposition: Status is: Inpatient Remains inpatient appropriate because: Acute kidney injury, rhabdomyolysis     Time spent: 37 minutes  Length of inpatient stay: 3 days  Author: Carliss LELON Canales, DO 12/02/2023 12:30 PM  For on call review [email or web address].

## 2023-12-03 DIAGNOSIS — N179 Acute kidney failure, unspecified: Secondary | ICD-10-CM | POA: Diagnosis not present

## 2023-12-03 LAB — RENAL FUNCTION PANEL
Albumin: 2.2 g/dL — ABNORMAL LOW (ref 3.5–5.0)
Anion gap: 9 (ref 5–15)
BUN: 47 mg/dL — ABNORMAL HIGH (ref 6–20)
CO2: 20 mmol/L — ABNORMAL LOW (ref 22–32)
Calcium: 8.5 mg/dL — ABNORMAL LOW (ref 8.9–10.3)
Chloride: 107 mmol/L (ref 98–111)
Creatinine, Ser: 2.71 mg/dL — ABNORMAL HIGH (ref 0.44–1.00)
GFR, Estimated: 20 mL/min — ABNORMAL LOW (ref >60)
Glucose, Bld: 125 mg/dL — ABNORMAL HIGH (ref 70–99)
Phosphorus: 4 mg/dL (ref 2.5–4.6)
Potassium: 4.1 mmol/L (ref 3.5–5.1)
Sodium: 136 mmol/L (ref 135–145)

## 2023-12-03 LAB — CK: Total CK: 8043 U/L — ABNORMAL HIGH (ref 38–234)

## 2023-12-03 LAB — CBC
HCT: 24.9 % — ABNORMAL LOW (ref 36.0–46.0)
Hemoglobin: 8.2 g/dL — ABNORMAL LOW (ref 12.0–15.0)
MCH: 29.5 pg (ref 26.0–34.0)
MCHC: 32.9 g/dL (ref 30.0–36.0)
MCV: 89.6 fL (ref 80.0–100.0)
Platelets: 230 K/uL (ref 150–400)
RBC: 2.78 MIL/uL — ABNORMAL LOW (ref 3.87–5.11)
RDW: 13.9 % (ref 11.5–15.5)
WBC: 11.1 K/uL — ABNORMAL HIGH (ref 4.0–10.5)
nRBC: 0 % (ref 0.0–0.2)

## 2023-12-03 LAB — TACROLIMUS LEVEL: Tacrolimus (FK506) - LabCorp: 6.3 ng/mL (ref 5.0–20.0)

## 2023-12-03 LAB — GLUCOSE, CAPILLARY: Glucose-Capillary: 219 mg/dL — ABNORMAL HIGH (ref 70–99)

## 2023-12-03 NOTE — Plan of Care (Signed)

## 2023-12-03 NOTE — Plan of Care (Signed)
   Problem: Education: Goal: Knowledge of General Education information will improve Description Including pain rating scale, medication(s)/side effects and non-pharmacologic comfort measures Outcome: Progressing   Problem: Clinical Measurements: Goal: Ability to maintain clinical measurements within normal limits will improve Outcome: Progressing   Problem: Activity: Goal: Risk for activity intolerance will decrease Outcome: Progressing

## 2023-12-03 NOTE — Progress Notes (Signed)
 [facility] Progress Note   Subjective:   Improving - weakness slowly improving but globally feeling better.  Less myalgias and stronger but not back to baseline. UOP 2.1L yesterday.  No new issues.  Husband bedside this AM.   Objective Vitals:   12/02/23 2034 12/03/23 0000 12/03/23 0510 12/03/23 0744  BP: 131/65 120/77 136/78 (!) 142/95  Pulse: 71 71 95 92  Resp: 17 17 18 17   Temp: 98.3 F (36.8 C) 98.4 F (36.9 C) 97.9 F (36.6 C) 98.3 F (36.8 C)  TempSrc:   Oral   SpO2: 98% 97%  97%  Weight:   65.9 kg   Height:       Physical Exam Gen: comfortable in bed  ENT: MMM CV:  RRR Abd: soft, RLQ transplant nontender Back: clear GU: no foley Extr: no edema Neuro:  5-/5 LE strength, no focal deficit - able to raise legs off bed and hold for multiple seconds similar to yesterday  Additional Objective Labs: Basic Metabolic Panel: Recent Labs  Lab 12/01/23 0442 12/02/23 0303 12/03/23 0310  NA 137 137 136  K 4.2 4.2 4.1  CL 109 108 107  CO2 19* 20* 20*  GLUCOSE 96 117* 125*  BUN 55* 51* 47*  CREATININE 3.37* 3.09* 2.71*  CALCIUM  8.5* 8.5* 8.5*  PHOS 5.1* 5.2* 4.0   Liver Function Tests: Recent Labs  Lab 12/01/23 0442 12/02/23 0303 12/03/23 0310  AST 246*  --   --   ALT 232*  --   --   ALKPHOS 43  --   --   BILITOT 0.4  --   --   PROT 4.8*  --   --   ALBUMIN 2.4* 2.3* 2.2*   No results for input(s): LIPASE, AMYLASE in the last 168 hours. CBC: Recent Labs  Lab 11/29/23 1845 11/30/23 0139 12/01/23 0442 12/02/23 0303 12/03/23 0310  WBC 15.6* 12.3* 12.6* 10.8* 11.1*  HGB 12.7 11.0* 9.7* 9.0* 8.2*  HCT 39.2 33.9* 30.4* 27.4* 24.9*  MCV 90.7 91.6 90.7 88.7 89.6  PLT 284 247 229 243 230   Blood Culture No results found for: SDES, SPECREQUEST, CULT, REPTSTATUS  Cardiac Enzymes: Recent Labs  Lab 11/29/23 1845 11/30/23 0139 12/01/23 0442 12/02/23 0303 12/03/23 0310  CKTOTAL 17,709* 15,670* 16,631* 12,760* 8,043*    CBG: Recent Labs  Lab 12/01/23 1944 12/02/23 0754 12/02/23 1212 12/02/23 1555 12/02/23 2038  GLUCAP 180* 130* 161* 221* 250*   Iron Studies:  Recent Labs    12/02/23 0303  IRON 76  TIBC 260  FERRITIN 227   @[email or web address] @ Studies/Results: No results found.  Medications:  lactated ringers  125 mL/hr at 12/03/23 0636    aspirin  EC  325 mg Oral Daily   atenolol   100 mg Oral QHS   buPROPion   150 mg Oral Daily   heparin   5,000 Units Subcutaneous Q8H   insulin  glargine-yfgn  8 Units Subcutaneous QHS   mycophenolate   500 mg Oral BID   predniSONE   5 mg Oral Q breakfast   sodium bicarbonate   1,300 mg Oral TID   sodium chloride  flush  3 mL Intravenous Q12H   tacrolimus   1 mg Oral BID    Assessment/PlanLorraine H Obrien is an 58 y.o. 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    **AKI:  baseline Cr 1.8-2.2mg /dL presenting with Cr 4 in setting of elevated CK c/w rhabdo etiology exertional +/-  statin likely with contributions of hypovolemia (hot weather) + ACEi + SGLT2i.  UA shows blood but none on microscopy, proteinuria at baseline.  Do not think recurrent FSGS but check UP/C - ordered, not yet sent.  Do not think rejection.  No plans for kidney biopsy at this time.  Cont IVF and daily labs with kidney function and CKD improving.  Given CK remains 8k will continue IVF another 24h.  Holding ACEi, SGLT2i, kerendia , lasix  and statin for now.    Cr should continue to improve.    **Rhabdomyolysis:  thought to be exertional +/- statin.  Her MRI of the pelvis notes high intensity signal in paraspinous muscles and upper thigh - this is nonspecific.  She has no family history of muscle DO or anesthesia complications.  CK is trending down now, continue supportive care.    **ESRD s/p LRD kidney transplant 2006:  had early recurrence of FSGS requiring plex but no recent issues.  Cont outpt meds - tac,  cellcept , pred.     **HTN:  ACEi on hold, on home atenolol . Resume home amlodipine if BP trending up, currently acceptable.  **Anemia:  Hb trending down, having hemorrhoidal bleeding (has been scheduled for banding several times).  Normal iron levels, cont to trend H/H.  Transfuse prn.  Has appt to have hemorrhoids banded.   ** uterine fibroid:  Initial imaging concerning for pelvic mass but f/u  MRI showing fibroids, not mass but incidentally noted rectal wall thickening and 2 presacral nodules - non specific with close f/u recommended.     **DM:  09/2023 A1c in 8s, discussed need for improved control long term.  Insulin  per primary.    **NAGMA:  on po bicarb, follow.    Will follow.   Continue IVF given CK remains high -- hopeful for d/c in 48-72h but this is pending continued improvement.   Manuelita Barters MD 12/03/2023, 8:43 AM  [facility] Pager: [phone]

## 2023-12-03 NOTE — Progress Notes (Signed)
 PROGRESS NOTE    Isabella Obrien  FMW:[id number] DOB: 1965-11-11 DOA: 11/29/2023 PCP: Rolinda Millman, MD   Brief Narrative:  58 y.o. 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 intake as appropriate - Creatinine downtrending appropriately, not yet back to baseline - Nephrology consulted and following closely.  Holding nephrotoxic agents.  Will continue tacrolimus , CellCept , prednisone .  Rejection seems unlikely at this time.  Rhabdomyolysis -Confirmed on MRI most notably upper thighs with paraspinal muscle inflammation -CK continues to downtrend -Likely the setting of recently increased statin dosing in the setting of recent stroke - Would recommend discontinuing statin at discharge -follow-up outpatient to discuss potential lower dose versus intermittent dosing given intolerance of daily dosing   None anion gap metabolic acidosis -In the setting of AKI, continue bicarb   Uterine mass - Initial CT scan and ultrasound gave concern for malignancy.  MRI verifying uterine fibroid.  Continue to monitor for now.   Incidentally noted rectal wall thickening - Noted incidentally on MRI.  Severe nonspecific nodes as well.  Patient with previous colonoscopy 5 years ago which was normal.  Patient with known hemorrhoids as well (banding procedure outpatient).  Would recommend patient follow-up for outpatient colonoscopy after discharge.  Will give hemorrhoid cream per patient request.   Diabetes mellitus - Insulin  sliding scale on board    Hypertension - Antihypertensives currently on hold.  DVT prophylaxis: heparin  injection 5,000 Units Start: 11/30/23 0600 Code Status:   Code Status: Full Code Family Communication: Brother at bedside  Status is: Inpatient  Dispo: The patient is from: Home              Anticipated d/c is to: Home              Anticipated d/c date is: 24 to 48 hours              Patient currently not medically stable for discharge  Consultants:  Nephrology  Procedures:  None  Antimicrobials:  None  Subjective: No acute issues or events overnight denies nausea vomiting diarrhea constipation headache fevers chills or chest pain.  Proximal anterior thigh tenderness improving but not resolved.  Objective: Vitals:   12/02/23 1554 12/02/23 2034 12/03/23 0000 12/03/23 0510  BP: 118/78 131/65 120/77 136/78  Pulse: 66 71 71 95  Resp: 19 17 17 18   Temp: 98.5 F (36.9 C) 98.3 F (36.8 C) 98.4 F (36.9 C) 97.9 F (36.6 C)  TempSrc:    Oral  SpO2: 97% 98% 97%   Weight:    65.9 kg  Height:        Intake/Output Summary (Last 24 hours) at 12/03/2023 0724 Last data filed at 12/03/2023 0514 Gross per 24 hour  Intake 887 ml  Output 2100 ml  Net -1213 ml   Filed Weights   12/01/23 0414 12/02/23 0516 12/03/23 0510  Weight: 65.3 kg 66.4 kg 65.9 kg    Examination:  General:  Pleasantly resting in bed, No acute distress. HEENT:  Normocephalic atraumatic.  Sclerae nonicteric, noninjected.  Extraocular movements intact bilaterally. Neck:  Without mass or deformity.  Trachea is midline. Lungs:  Clear to auscultate bilaterally without rhonchi, wheeze, or rales. Heart:  Regular rate and rhythm.  Without murmurs, rubs, or gallops. Abdomen:  Soft, nontender, nondistended.  Without guarding or rebound. Extremities: Without cyanosis, clubbing, edema, or obvious deformity. Skin:  Warm and dry, no erythema.  Data Reviewed: I have personally reviewed following labs and imaging studies  CBC: Recent Labs   Lab 11/29/23 1845 11/30/23 0139 12/01/23 0442 12/02/23 0303 12/03/23 0310  WBC 15.6* 12.3* 12.6* 10.8* 11.1*  HGB 12.7 11.0* 9.7* 9.0* 8.2*  HCT 39.2 33.9* 30.4* 27.4* 24.9*  MCV 90.7 91.6 90.7 88.7 89.6  PLT 284 247 229 243 230   Basic Metabolic Panel: Recent Labs  Lab 11/29/23 1845 11/30/23 0139 12/01/23 0442 12/02/23 0303 12/03/23 0310  NA 135 136 137 137 136  K 4.9 4.6 4.2 4.2 4.1  CL 100 108 109 108 107  CO2 20* 16* 19* 20* 20*  GLUCOSE 198* 194* 96 117* 125*  BUN 67* 67* 55* 51* 47*  CREATININE 4.02* 3.64* 3.37* 3.09* 2.71*  CALCIUM  9.3 8.3* 8.5* 8.5* 8.5*  MG  --  1.9 1.7  --   --   PHOS  --  6.2* 5.1* 5.2* 4.0   GFR: Estimated Creatinine Clearance: 21.2 mL/min (A) (by C-G formula based on SCr of 2.71 mg/dL (H)). Liver Function Tests: Recent Labs  Lab 12/01/23 0442 12/02/23 0303 12/03/23 0310  AST 246*  --   --   ALT 232*  --   --   ALKPHOS 43  --   --   BILITOT 0.4  --   --   PROT 4.8*  --   --   ALBUMIN 2.4* 2.3* 2.2*   No results for input(s): LIPASE, AMYLASE in the last 168 hours. No results for input(s): AMMONIA in the last 168 hours. Coagulation Profile: No results for input(s): INR, PROTIME in the last 168 hours. Cardiac Enzymes: Recent Labs  Lab 11/29/23 1845 11/30/23 0139 12/01/23 0442 12/02/23 0303 12/03/23 0310  CKTOTAL 17,709* 15,670* 16,631* 12,760* 8,043*   BNP (last 3 results) No results for input(s): PROBNP in the last 8760 hours. HbA1C: No results for input(s): HGBA1C in the last 72 hours. CBG: Recent Labs  Lab 12/01/23 1944 12/02/23 0754 12/02/23 1212 12/02/23 1555 12/02/23 2038  GLUCAP 180* 130* 161* 221* 250*   Lipid Profile: No results for input(s): CHOL, HDL, LDLCALC, TRIG, CHOLHDL, LDLDIRECT in the last 72 hours. Thyroid Function Tests: No results for input(s): TSH, T4TOTAL, FREET4, T3FREE, THYROIDAB in the last 72 hours. Anemia Panel: Recent Labs    12/02/23 0303   FERRITIN 227  TIBC 260  IRON 76    Radiology Studies: No results found.  Scheduled Meds:  aspirin  EC  325 mg Oral Daily   atenolol   100 mg Oral QHS   buPROPion   150 mg Oral Daily   heparin   5,000 Units Subcutaneous Q8H   insulin  glargine-yfgn  8 Units Subcutaneous QHS   mycophenolate   500 mg Oral BID   predniSONE   5 mg Oral Q breakfast   sodium bicarbonate   1,300 mg Oral TID   sodium chloride  flush  3 mL Intravenous Q12H   tacrolimus   1 mg Oral BID   Continuous Infusions:  lactated ringers  125 mL/hr at 12/03/23 0636     LOS: 4 days   Time spent:  Elsie JAYSON Montclair, DO [facility]  If 7PM-7AM, please contact night-coverage [email or web address]  12/03/2023, 7:24 AM

## 2023-12-04 DIAGNOSIS — N179 Acute kidney failure, unspecified: Secondary | ICD-10-CM | POA: Diagnosis not present

## 2023-12-04 LAB — CBC
HCT: 25.7 % — ABNORMAL LOW (ref 36.0–46.0)
Hemoglobin: 8.2 g/dL — ABNORMAL LOW (ref 12.0–15.0)
MCH: 29.1 pg (ref 26.0–34.0)
MCHC: 31.9 g/dL (ref 30.0–36.0)
MCV: 91.1 fL (ref 80.0–100.0)
Platelets: 272 K/uL (ref 150–400)
RBC: 2.82 MIL/uL — ABNORMAL LOW (ref 3.87–5.11)
RDW: 14 % (ref 11.5–15.5)
WBC: 11.6 K/uL — ABNORMAL HIGH (ref 4.0–10.5)
nRBC: 0 % (ref 0.0–0.2)

## 2023-12-04 LAB — RENAL FUNCTION PANEL
Albumin: 2.2 g/dL — ABNORMAL LOW (ref 3.5–5.0)
Anion gap: 7 (ref 5–15)
BUN: 43 mg/dL — ABNORMAL HIGH (ref 6–20)
CO2: 22 mmol/L (ref 22–32)
Calcium: 8.5 mg/dL — ABNORMAL LOW (ref 8.9–10.3)
Chloride: 108 mmol/L (ref 98–111)
Creatinine, Ser: 2.48 mg/dL — ABNORMAL HIGH (ref 0.44–1.00)
GFR, Estimated: 22 mL/min — ABNORMAL LOW (ref >60)
Glucose, Bld: 110 mg/dL — ABNORMAL HIGH (ref 70–99)
Phosphorus: 3.2 mg/dL (ref 2.5–4.6)
Potassium: 4.1 mmol/L (ref 3.5–5.1)
Sodium: 137 mmol/L (ref 135–145)

## 2023-12-04 LAB — CK: Total CK: 5093 U/L — ABNORMAL HIGH (ref 38–234)

## 2023-12-04 MED ORDER — SODIUM BICARBONATE 650 MG PO TABS
1300.0000 mg | ORAL_TABLET | Freq: Two times a day (BID) | ORAL | Status: DC
  Administered 2023-12-04: 1300 mg via ORAL
  Filled 2023-12-04: qty 2

## 2023-12-04 NOTE — Progress Notes (Signed)
 [facility] Progress Note   Subjective:   Improving - weakness slowly improving but globally feeling better each day.  Less myalgias and stronger but not back to baseline. UOP 2L yesterday.  No new issues. No family present today.  Objective Vitals:   12/03/23 2017 12/04/23 0039 12/04/23 0422 12/04/23 0457  BP: 126/77 128/72 120/81   Pulse: 71 70 66   Resp: 20 20 20    Temp: 98.5 F (36.9 C) 98.5 F (36.9 C) 98.5 F (36.9 C)   TempSrc:      SpO2: 95% 99% 99%   Weight:    65.9 kg  Height:       Physical Exam Gen: comfortable in bed  ENT: MMM CV:  RRR Abd: soft, RLQ transplant nontender Back: clear GU: no foley Extr: no edema Neuro:  5-/5 LE strength, no focal deficit  Additional Objective Labs: Basic Metabolic Panel: Recent Labs  Lab 12/02/23 0303 12/03/23 0310 12/04/23 0556  NA 137 136 137  K 4.2 4.1 4.1  CL 108 107 108  CO2 20* 20* 22  GLUCOSE 117* 125* 110*  BUN 51* 47* 43*  CREATININE 3.09* 2.71* 2.48*  CALCIUM  8.5* 8.5* 8.5*  PHOS 5.2* 4.0 3.2   Liver Function Tests: Recent Labs  Lab 12/01/23 0442 12/02/23 0303 12/03/23 0310 12/04/23 0556  AST 246*  --   --   --   ALT 232*  --   --   --   ALKPHOS 43  --   --   --   BILITOT 0.4  --   --   --   PROT 4.8*  --   --   --   ALBUMIN 2.4* 2.3* 2.2* 2.2*   No results for input(s): LIPASE, AMYLASE in the last 168 hours. CBC: Recent Labs  Lab 11/30/23 0139 12/01/23 0442 12/02/23 0303 12/03/23 0310 12/04/23 0556  WBC 12.3* 12.6* 10.8* 11.1* 11.6*  HGB 11.0* 9.7* 9.0* 8.2* 8.2*  HCT 33.9* 30.4* 27.4* 24.9* 25.7*  MCV 91.6 90.7 88.7 89.6 91.1  PLT 247 229 243 230 272   Blood Culture No results found for: SDES, SPECREQUEST, CULT, REPTSTATUS  Cardiac Enzymes: Recent Labs  Lab 11/29/23 1845 11/30/23 0139 12/01/23 0442 12/02/23 0303 12/03/23 0310  CKTOTAL 17,709* 15,670* 16,631* 12,760* 8,043*   CBG: Recent Labs  Lab 12/02/23 0754 12/02/23 1212 12/02/23 1555  12/02/23 2038 12/03/23 2016  GLUCAP 130* 161* 221* 250* 219*   Iron Studies:  Recent Labs    12/02/23 0303  IRON 76  TIBC 260  FERRITIN 227   @[email or web address] @ Studies/Results: No results found.  Medications:  lactated ringers  125 mL/hr at 12/04/23 0719    aspirin  EC  325 mg Oral Daily   atenolol   100 mg Oral QHS   buPROPion   150 mg Oral Daily   heparin   5,000 Units Subcutaneous Q8H   insulin  glargine-yfgn  8 Units Subcutaneous QHS   mycophenolate   500 mg Oral BID   predniSONE   5 mg Oral Q breakfast   sodium bicarbonate   1,300 mg Oral TID   sodium chloride  flush  3 mL Intravenous Q12H   tacrolimus   1 mg Oral BID    Assessment/PlanLorraine H Obrien is an 58 y.o. 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    **AKI:  baseline Cr 1.8-2.2mg /dL presenting with Cr 4 in setting of elevated CK c/w rhabdo etiology exertional +/-  statin likely with contributions of hypovolemia (hot weather) + ACEi + SGLT2i.  UA shows blood but none on microscopy, proteinuria at baseline.  Do not think recurrent FSGS but check UP/C - ordered, not yet sent.  Do not think rejection.  No plans for kidney biopsy at this time.  Will hold IVF today and if creatinine stable/improved further tomorrow as expected should d/c home.  Holding ACEi, SGLT2i, kerendia , lasix  and statin - would defer resuming all of these to outpt setting.  Will arrange clinic f/u with her in 1-2 weeks with Dr. Gearline who is her usual provider.   **Rhabdomyolysis:  thought to be exertional +/- statin.  Her MRI of the pelvis notes high intensity signal in paraspinous muscles and upper thigh - this is nonspecific.  She has no family history of muscle DO or anesthesia complications.  CK is trending down now, continue supportive care.    **ESRD s/p LRD kidney transplant 2006:  had early recurrence of FSGS requiring plex but no recent issues.  Cont outpt meds  - tac, cellcept , pred.     **HTN:  ACEi on hold, on home atenolol . Resume home amlodipine if BP trending up, currently acceptable.  **Anemia:  Hb trended down, having hemorrhoidal bleeding (has been scheduled for banding several times).  Normal iron levels, cont to trend H/H which is stable in 8s today.  Transfuse prn.  Has appt to have hemorrhoids banded.   ** uterine fibroid:  Initial imaging concerning for pelvic mass but f/u  MRI showing fibroids, not mass but incidentally noted rectal wall thickening and 2 presacral nodules - non specific with close f/u recommended.     **DM:  09/2023 A1c in 8s, discussed need for improved control long term.  Insulin  per primary.    **NAGMA:  resolved with po bicarb, will decrease dose today   As above d/c IVF today, check labs in AM and if all continued improvement d/c home.  Hold ACEi, SGLT2i, kerendia  and statin at d/c.  Will have labs checked mid week at [facility] (my office staff will order) and f/u with usual nephrologist in a few weeks.  Call nephrology tomorrow if any issues, I expect continued improvement so won't plan to see.   Isabella Barters MD 12/04/2023, 8:28 AM  [facility] Pager: [phone]

## 2023-12-04 NOTE — Plan of Care (Signed)

## 2023-12-04 NOTE — Discharge Summary (Signed)
 Physician Discharge Summary  Isabella Obrien FMW:[id number] DOB: 1965-09-23 DOA: 11/29/2023  PCP: Rolinda Millman, MD  Admit date: 11/29/2023 Discharge date: 12/04/2023  Admitted From: Home Disposition: Home  Recommendations for Outpatient Follow-up:  Follow up with PCP in 1-2 weeks Follow-up with nephrology later this week as planned, repeat labs midweek with [facility] per nephrology recommendations  Home Health: None Equipment/Devices: None  Discharge Condition: Stable CODE STATUS: Full Diet recommendation: Low-salt low-fat renal diet  Brief/Interim Summary: 58 y.o. 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   Patient mated as above with AKI in the setting of ESRD status post renal transplant with profound rhabdomyolysis.  MRI confirmed bilateral upper thigh inflammation concerning for rhabdo, CK initially elevated to 16,000 downtrending now appropriately with p.o. intake, IV fluids discontinued per discussion with nephrology.  Rhabdo thought to be combination of new increased statin dose as well as increased exertion over the past few weeks while on vacation, we will discontinue ACE inhibitor, SGLT2, Kerendia  and statin at discharge.  Patient tolerating p.o. well otherwise stable and agreeable for discharge home.  Close follow-up with PCP and nephrology outlined as above  Discharge Diagnoses:  Principal Problem:   AKI (acute kidney injury) (HCC) Active Problems:   Rhabdomyolysis   Adnexal mass    Discharge Instructions  Discharge Instructions     Call MD for:  difficulty breathing, headache or visual disturbances   Complete by: As directed    Call MD for:  extreme fatigue   Complete by: As directed    Call MD for:  hives   Complete by: As directed    Call MD for:  persistant dizziness or light-headedness   Complete by: As directed     Call MD for:  persistant nausea and vomiting   Complete by: As directed    Call MD for:  severe uncontrolled pain   Complete by: As directed    Call MD for:  temperature >100.4   Complete by: As directed    Diet - low sodium heart healthy   Complete by: As directed    Increase activity slowly   Complete by: As directed       Allergies as of 12/04/2023       Reactions   Cefuroxime Swelling, Other (See Comments)   Ceftin:hypertension   Benadryl [diphenhydramine Hcl] Swelling, Anxiety, Other (See Comments)   Hyperactivity and angioedema, also   Tape Rash, Other (See Comments)   Paper tape is okay   Wound Dressing Adhesive Rash, Other (See Comments)   Paper tape is ok        Medication List     STOP taking these medications    benazepril 40 MG tablet Commonly known as: LOTENSIN   Farxiga  10 MG Tabs tablet Generic drug: dapagliflozin  propanediol   Kerendia  20 MG Tabs Generic drug: Finerenone        TAKE these medications    acetaminophen  500 MG tablet Commonly known as: TYLENOL  Take 1,000 mg by mouth every 6 (six) hours as needed for headache or mild pain (pain score 1-3).   albuterol  108 (90 Base) MCG/ACT inhaler Commonly known as: VENTOLIN  HFA Inhale 2 puffs into the lungs every 6 (six) hours as needed for wheezing or shortness of breath.   albuterol  (2.5 MG/3ML) 0.083% nebulizer solution Commonly known as: PROVENTIL  Take 3 mLs (2.5 mg total) by nebulization  2 (two) times daily as needed for wheezing or shortness of breath.   amLODipine 5 MG tablet Commonly known as: NORVASC Take 5 mg by mouth daily.   aspirin  EC 325 MG tablet Take 1 tablet (325 mg total) by mouth daily.   atenolol  100 MG tablet Commonly known as: TENORMIN  Take 100 mg by mouth at bedtime.   buPROPion  150 MG 24 hr tablet Commonly known as: Wellbutrin  XL Take 1 tablet (150 mg total) by mouth daily.   calcitRIOL  0.25 MCG capsule Commonly known as: ROCALTROL  Take 0.25 mcg by mouth  daily.   Fish Oil 1000 MG Caps Take 1,000 mg by mouth in the morning and at bedtime.   furosemide  40 MG tablet Commonly known as: LASIX  Take 1 tablet (40 mg total) by mouth daily as needed. What changed: reasons to take this   glipiZIDE 10 MG 24 hr tablet Commonly known as: GLUCOTROL XL Take 10 mg by mouth daily.   hydrocortisone  2.5 % rectal cream Commonly known as: ANUSOL -HC Place 1 Application rectally 2 (two) times daily.   mycophenolate  500 MG tablet Commonly known as: CELLCEPT  Take 500 mg by mouth in the morning and at bedtime.   predniSONE  5 MG tablet Commonly known as: DELTASONE  Take 5 mg by mouth daily with breakfast.   sodium bicarbonate  650 MG tablet Take 1,300 mg by mouth 3 (three) times daily.   tacrolimus  1 MG capsule Commonly known as: PROGRAF  Take 1 mg by mouth in the morning and at bedtime.   ticagrelor  90 MG Tabs tablet Commonly known as: BRILINTA  Take 90 mg by mouth 2 (two) times daily.   Tresiba FlexTouch 100 UNIT/ML FlexTouch Pen Generic drug: insulin  degludec Inject 13 Units into the skin at bedtime.   Wixela Inhub 100-50 MCG/ACT Aepb Generic drug: fluticasone-salmeterol Inhale 1 puff into the lungs 2 (two) times daily.   zolpidem  10 MG tablet Commonly known as: AMBIEN  Take 10 mg by mouth at bedtime.        Allergies  Allergen Reactions   Cefuroxime Swelling and Other (See Comments)    Ceftin:hypertension   Benadryl [Diphenhydramine Hcl] Swelling, Anxiety and Other (See Comments)    Hyperactivity and angioedema, also   Tape Rash and Other (See Comments)    Paper tape is okay   Wound Dressing Adhesive Rash and Other (See Comments)    Paper tape is ok    Consultations: Nephrology   Procedures/Studies: MR PELVIS WO CONTRAST Result Date: 12/01/2023 CLINICAL DATA:  Adnexal mass suspected. EXAM: MRI PELVIS WITHOUT CONTRAST TECHNIQUE: Multiplanar multisequence MR imaging of the pelvis was performed. No intravenous contrast was  administered. COMPARISON:  CT and ultrasound exam 11/29/2023. FINDINGS: Urinary Tract: The urinary bladder appears normal for the degree of distention. Bowel:  Suspicion for soft tissue thickening in the low rectum. Vascular/Lymphatic: No abdominal aortic aneurysm. No abdominal lymphadenopathy Reproductive: Uterine fibroids evident. Including dominant exophytic right fundal fibroid measuring 4.4 x 4.6 x 5.4 cm. Junctional zone preserved. Hypointense stroma of the cervix is preserved within both the is cysts evident. Right ovary measures 1.9 x 1.3 x 2.1 cm without mass lesion. Left ovary measures 1.9 x 1.2 x 1.8 cm without mass lesion. Other: Edema is noted in the root of the central small bowel mesentery, new since yesterday's CT. Expected flow void within the associated dominant central mesenteric vasculature. Transplant kidney identified right pelvis. Musculoskeletal: Presacral edema evident. 12 mm presacral nodule identified on 27/4. 60 mm presacral lesion identified on 19/4. There is diffuse  high signal intensity in the gluteus medius muscles bilaterally as well as the abductor muscles, iliacus muscles, psoas muscles and paraspinal muscles bilaterally. Possible tiny focal fluid collection in the right gluteus medius muscle on image 17/5. IMPRESSION: 1. No evidence for adnexal mass. 2. Uterine fibroids, including dominant exophytic right fundal fibroid measuring 4.4 x 4.6 x 5.4 cm. This lesion accounts for the findings on recent CT and pelvic ultrasound. 3. Suspicion for irregular wall thickening in the low rectum. Neoplasm not excluded. Close clinical correlation recommended with review of colorectal cancer screening history. 4. Presacral edema with 2 presacral nodules, the larger measuring 60 mm. These are nonspecific on this noncontrast study. Metastatic disease not excluded. Close follow-up warranted. 5. Diffuse high signal intensity in the numerous muscles of the anatomic pelvis, paraspinal region and upper  thighs. 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 6. Edema in the root of the central small bowel mesentery, not visible on yesterday's CT. Nonspecific finding but warranting follow-up. Electronically Signed   By: Camellia Candle M.D.   On: 12/01/2023 08:29   US  Renal Transplant w/Doppler Result Date: 11/30/2023 CLINICAL DATA:  Acute kidney injury. EXAM: ULTRASOUND OF RENAL TRANSPLANT WITH RENAL DOPPLER ULTRASOUND TECHNIQUE: Ultrasound examination of the renal transplant was performed with gray-scale, color and duplex doppler evaluation. COMPARISON:  CT abdomen pelvis 11/29/2023. Ultrasound kidney 01/18/2023 FINDINGS: Transplant kidney location: Right lower quadrant Transplant Kidney: Renal measurements: 11.6 x 5.3 x 5.1 cm = volume: . Normal in size and parenchymal echogenicity. No evidence of mass or hydronephrosis. No peri-transplant fluid collection seen. Color flow in the main renal artery:  Yes Color flow in the main renal vein:  Yes Duplex Doppler Evaluation: Main Renal Artery Velocity: 51 cm/sec Main Renal Artery Resistive Index: 0.8 Venous waveform in main renal vein:  Present Intrarenal resistive index in upper pole:  0.76 (normal 0.6-0.8; equivocal 0.8-0.9; abnormal >= 0.9) Intrarenal resistive index in lower pole: 0.77 (normal 0.6-0.8; equivocal 0.8-0.9; abnormal >= 0.9) Bladder: Normal for degree of bladder distention. Other findings:  None. IMPRESSION: Normal ultrasound appearance of right lower quadrant transplant kidney. Electronically Signed   By: Elsie Gravely M.D.   On: 11/30/2023 00:31   US  PELVIC COMPLETE WITH TRANSVAGINAL Result Date: 11/30/2023 CLINICAL DATA:  Adnexal mass. EXAM: TRANSABDOMINAL AND TRANSVAGINAL ULTRASOUND OF PELVIS TECHNIQUE: Both transabdominal and transvaginal ultrasound examinations of the pelvis were performed. Transabdominal technique was performed for global imaging of the pelvis including uterus, ovaries, adnexal regions, and  pelvic cul-de-sac. It was necessary to proceed with endovaginal exam following the transabdominal exam to visualize the ovaries and endometrium. COMPARISON:  CT abdomen and pelvis 11/29/2023 FINDINGS: Uterus Measurements: 5.1 x 2.6 x 2.7 cm = volume: 18 mL. No fibroids or other mass visualized. Endometrium Thickness: 3.7 mm.  Small amount of fluid in the endocervical canal. Right ovary The right ovary is not specifically demonstrated. In the right adnexum, there is a heterogeneous solid structure measuring 5.1 x 4.1 x 4.3 cm. This corresponds to the lesion seen on CT. Visualization is somewhat limited by bowel. This could represent an enlarged right ovary/ovarian mass or it could be an exophytic fibroid. Left ovary Measurements: 2.6 x 1.8 x 3.3 cm = volume: 8 mL. Normal appearance/no adnexal mass. Other findings No abnormal free fluid. IMPRESSION: 1. Visualization of the right adnexa is limited due to bowel. The right ovary is not specifically visualized. There is a 5.1 cm solid mass lesion in the right adnexum corresponding to CT abnormality. This could  represent a right ovarian mass or an exophytic fibroid. Consider elective follow-up MRI. 2. Normal ultrasound appearance of the uterus and left ovary. Electronically Signed   By: Elsie Gravely M.D.   On: 11/30/2023 00:28   CT Renal Stone Study Result Date: 11/29/2023 CLINICAL DATA:  Flank pain weakness EXAM: CT ABDOMEN AND PELVIS WITHOUT CONTRAST TECHNIQUE: Multidetector CT imaging of the abdomen and pelvis was performed following the standard protocol without IV contrast. RADIATION DOSE REDUCTION: This exam was performed according to the departmental dose-optimization program which includes automated exposure control, adjustment of the mA and/or kV according to patient size and/or use of iterative reconstruction technique. COMPARISON:  Ultrasound 01/18/2023 FINDINGS: Lower chest: Lung bases demonstrate no acute airspace disease. Hepatobiliary: No focal liver  abnormality is seen. No gallstones, gallbladder wall thickening, or biliary dilatation. Pancreas: Unremarkable. No pancreatic ductal dilatation or surrounding inflammatory changes. Spleen: Normal in size without focal abnormality. Adrenals/Urinary Tract: Adrenal glands are normal. Atrophic native kidneys. Right iliac fossa transplant kidney without hydronephrosis or focal abnormality without contrast. Bladder is unremarkable Stomach/Bowel: Stomach nonenlarged. No dilated small bowel. No acute bowel wall thickening. Negative appendix. Vascular/Lymphatic: Aortic atherosclerosis. No aneurysm. No suspicious lymph nodes. Reproductive: Lobulated uterus with 4.3 x 4.3 cm mass on the right side. Other: Negative for pelvic effusion or free air. Partially calcified slightly irregular soft tissue density anterior to the sacrum measuring 16 x 15 mm on series 3, image 58. Small left inferior presacral nodule measuring 10 mm on series 3, image 66. Mild presacral soft tissue thickening, series 3, image 63 Musculoskeletal: No acute osseous abnormality. IMPRESSION: 1. Negative for hydronephrosis or ureteral stone. Atrophic native kidneys. Right iliac fossa transplant kidney without hydronephrosis or focal abnormality in the absence of contrast. 2. Lobulated uterus with 4.3 cm mass on the right side, likely due to fibroid change, with adnexal mass not entirely excluded. Correlation with nonemergent pelvic ultrasound is suggested. 3. Partially calcified slightly irregular soft tissue density anterior to the sacrum measuring 16 x 15 mm. Small left inferior presacral nodule measuring 10 mm. Mild soft tissue density in the presacral region, no adjacent sacral fracture or other inflammatory process in the pelvis. Nodules could be secondary to adenopathy, either infectious/inflammatory versus neoplastic in etiology. Correlate for any known history of malignancy. At a minimum, short interval CT follow-up would be advised. 4. Aortic  atherosclerosis. Aortic Atherosclerosis (ICD10-I70.0). Electronically Signed   By: Luke Bun M.D.   On: 11/29/2023 19:39     Subjective: No acute issues or events overnight   Discharge Exam: Vitals:   12/04/23 0039 12/04/23 0422  BP: 128/72 120/81  Pulse: 70 66  Resp: 20 20  Temp: 98.5 F (36.9 C) 98.5 F (36.9 C)  SpO2: 99% 99%   Vitals:   12/03/23 2017 12/04/23 0039 12/04/23 0422 12/04/23 0457  BP: 126/77 128/72 120/81   Pulse: 71 70 66   Resp: 20 20 20    Temp: 98.5 F (36.9 C) 98.5 F (36.9 C) 98.5 F (36.9 C)   TempSrc:      SpO2: 95% 99% 99%   Weight:    65.9 kg  Height:        General: Pt is alert, awake, not in acute distress Cardiovascular: RRR, S1/S2 +, no rubs, no gallops Respiratory: CTA bilaterally, no wheezing, no rhonchi Abdominal: Soft, NT, ND, bowel sounds + Extremities: no edema, no cyanosis    The results of significant diagnostics from this hospitalization (including imaging, microbiology, ancillary and laboratory) are listed  below for reference.     Microbiology: No results found for this or any previous visit (from the past 240 hours).   Labs: BNP (last 3 results) No results for input(s): BNP in the last 8760 hours. Basic Metabolic Panel: Recent Labs  Lab 11/30/23 0139 12/01/23 0442 12/02/23 0303 12/03/23 0310 12/04/23 0556  NA 136 137 137 136 137  K 4.6 4.2 4.2 4.1 4.1  CL 108 109 108 107 108  CO2 16* 19* 20* 20* 22  GLUCOSE 194* 96 117* 125* 110*  BUN 67* 55* 51* 47* 43*  CREATININE 3.64* 3.37* 3.09* 2.71* 2.48*  CALCIUM  8.3* 8.5* 8.5* 8.5* 8.5*  MG 1.9 1.7  --   --   --   PHOS 6.2* 5.1* 5.2* 4.0 3.2   Liver Function Tests: Recent Labs  Lab 12/01/23 0442 12/02/23 0303 12/03/23 0310 12/04/23 0556  AST 246*  --   --   --   ALT 232*  --   --   --   ALKPHOS 43  --   --   --   BILITOT 0.4  --   --   --   PROT 4.8*  --   --   --   ALBUMIN 2.4* 2.3* 2.2* 2.2*   No results for input(s): LIPASE, AMYLASE in the  last 168 hours. No results for input(s): AMMONIA in the last 168 hours. CBC: Recent Labs  Lab 11/30/23 0139 12/01/23 0442 12/02/23 0303 12/03/23 0310 12/04/23 0556  WBC 12.3* 12.6* 10.8* 11.1* 11.6*  HGB 11.0* 9.7* 9.0* 8.2* 8.2*  HCT 33.9* 30.4* 27.4* 24.9* 25.7*  MCV 91.6 90.7 88.7 89.6 91.1  PLT 247 229 243 230 272   Cardiac Enzymes: Recent Labs  Lab 11/30/23 0139 12/01/23 0442 12/02/23 0303 12/03/23 0310 12/04/23 0556  CKTOTAL 15,670* 16,631* 12,760* 8,043* 5,093*   BNP: Invalid input(s): POCBNP CBG: Recent Labs  Lab 12/02/23 0754 12/02/23 1212 12/02/23 1555 12/02/23 2038 12/03/23 2016  GLUCAP 130* 161* 221* 250* 219*   D-Dimer No results for input(s): DDIMER in the last 72 hours. Hgb A1c No results for input(s): HGBA1C in the last 72 hours. Lipid Profile No results for input(s): CHOL, HDL, LDLCALC, TRIG, CHOLHDL, LDLDIRECT in the last 72 hours. Thyroid function studies No results for input(s): TSH, T4TOTAL, T3FREE, THYROIDAB in the last 72 hours.  Invalid input(s): FREET3 Anemia work up Recent Labs    12/02/23 0303  FERRITIN 227  TIBC 260  IRON 76   Urinalysis    Component Value Date/Time   COLORURINE YELLOW 11/29/2023 1845   APPEARANCEUR HAZY (A) 11/29/2023 1845   LABSPEC 1.015 11/29/2023 1845   PHURINE 6.0 11/29/2023 1845   GLUCOSEU >=500 (A) 11/29/2023 1845   HGBUR LARGE (A) 11/29/2023 1845   BILIRUBINUR NEGATIVE 11/29/2023 1845   BILIRUBINUR Negative 01/25/2020 1354   KETONESUR NEGATIVE 11/29/2023 1845   PROTEINUR 100 (A) 11/29/2023 1845   UROBILINOGEN 0.2 01/25/2020 1354   UROBILINOGEN 0.2 12/20/2013 1220   NITRITE NEGATIVE 11/29/2023 1845   LEUKOCYTESUR NEGATIVE 11/29/2023 1845   Sepsis Labs Recent Labs  Lab 12/01/23 0442 12/02/23 0303 12/03/23 0310 12/04/23 0556  WBC 12.6* 10.8* 11.1* 11.6*   Microbiology No results found for this or any previous visit (from the past 240 hours).   Time  coordinating discharge: Over 30 minutes  SIGNED:   Elsie JAYSON [place], DO [facility] 12/04/2023, 10:57 AM Pager   If 7PM-7AM, please contact night-coverage [email or web address]

## 2023-12-15 ENCOUNTER — Emergency Department (HOSPITAL_COMMUNITY)

## 2023-12-15 ENCOUNTER — Other Ambulatory Visit: Payer: Self-pay

## 2023-12-15 ENCOUNTER — Observation Stay (HOSPITAL_COMMUNITY)
Admission: EM | Admit: 2023-12-15 | Discharge: 2023-12-17 | Disposition: A | Discharge status: Home / Self Care | Attending: Internal Medicine | Admitting: Internal Medicine

## 2023-12-15 DIAGNOSIS — N179 Acute kidney failure, unspecified: Secondary | ICD-10-CM

## 2023-12-15 DIAGNOSIS — I129 Hypertensive chronic kidney disease with stage 1 through stage 4 chronic kidney disease, or unspecified chronic kidney disease: Secondary | ICD-10-CM | POA: Insufficient documentation

## 2023-12-15 DIAGNOSIS — J449 Chronic obstructive pulmonary disease, unspecified: Secondary | ICD-10-CM | POA: Diagnosis not present

## 2023-12-15 DIAGNOSIS — Z940 Kidney transplant status: Secondary | ICD-10-CM

## 2023-12-15 DIAGNOSIS — K922 Gastrointestinal hemorrhage, unspecified: Secondary | ICD-10-CM | POA: Insufficient documentation

## 2023-12-15 DIAGNOSIS — D649 Anemia, unspecified: Secondary | ICD-10-CM | POA: Diagnosis not present

## 2023-12-15 DIAGNOSIS — I6389 Other cerebral infarction: Secondary | ICD-10-CM | POA: Diagnosis not present

## 2023-12-15 DIAGNOSIS — F17210 Nicotine dependence, cigarettes, uncomplicated: Secondary | ICD-10-CM | POA: Insufficient documentation

## 2023-12-15 DIAGNOSIS — Z7982 Long term (current) use of aspirin: Secondary | ICD-10-CM | POA: Diagnosis not present

## 2023-12-15 DIAGNOSIS — N184 Chronic kidney disease, stage 4 (severe): Secondary | ICD-10-CM | POA: Diagnosis not present

## 2023-12-15 DIAGNOSIS — Z794 Long term (current) use of insulin: Secondary | ICD-10-CM | POA: Insufficient documentation

## 2023-12-15 DIAGNOSIS — F39 Unspecified mood [affective] disorder: Secondary | ICD-10-CM | POA: Insufficient documentation

## 2023-12-15 DIAGNOSIS — F10920 Alcohol use, unspecified with intoxication, uncomplicated: Secondary | ICD-10-CM | POA: Diagnosis not present

## 2023-12-15 DIAGNOSIS — R770 Abnormality of albumin: Secondary | ICD-10-CM | POA: Insufficient documentation

## 2023-12-15 DIAGNOSIS — E042 Nontoxic multinodular goiter: Secondary | ICD-10-CM | POA: Diagnosis not present

## 2023-12-15 DIAGNOSIS — E119 Type 2 diabetes mellitus without complications: Secondary | ICD-10-CM

## 2023-12-15 DIAGNOSIS — I639 Cerebral infarction, unspecified: Principal | ICD-10-CM | POA: Diagnosis present

## 2023-12-15 DIAGNOSIS — E1122 Type 2 diabetes mellitus with diabetic chronic kidney disease: Secondary | ICD-10-CM | POA: Diagnosis not present

## 2023-12-15 DIAGNOSIS — Z8679 Personal history of other diseases of the circulatory system: Secondary | ICD-10-CM

## 2023-12-15 DIAGNOSIS — E8809 Other disorders of plasma-protein metabolism, not elsewhere classified: Secondary | ICD-10-CM

## 2023-12-15 DIAGNOSIS — R4781 Slurred speech: Secondary | ICD-10-CM | POA: Diagnosis present

## 2023-12-15 LAB — DIFFERENTIAL
Abs Immature Granulocytes: 0.07 K/uL (ref 0.00–0.07)
Basophils Absolute: 0 K/uL (ref 0.0–0.1)
Basophils Relative: 0 %
Eosinophils Absolute: 0.1 K/uL (ref 0.0–0.5)
Eosinophils Relative: 1 %
Immature Granulocytes: 1 %
Lymphocytes Relative: 12 %
Lymphs Abs: 1.2 K/uL (ref 0.7–4.0)
Monocytes Absolute: 0.9 K/uL (ref 0.1–1.0)
Monocytes Relative: 9 %
Neutro Abs: 7.9 K/uL — ABNORMAL HIGH (ref 1.7–7.7)
Neutrophils Relative %: 77 %

## 2023-12-15 LAB — CBG MONITORING, ED: Glucose-Capillary: 191 mg/dL — ABNORMAL HIGH (ref 70–99)

## 2023-12-15 LAB — COMPREHENSIVE METABOLIC PANEL WITH GFR
ALT: 41 U/L (ref 0–44)
AST: 20 U/L (ref 15–41)
Albumin: 2.7 g/dL — ABNORMAL LOW (ref 3.5–5.0)
Alkaline Phosphatase: 108 U/L (ref 38–126)
Anion gap: 10 (ref 5–15)
BUN: 41 mg/dL — ABNORMAL HIGH (ref 6–20)
CO2: 23 mmol/L (ref 22–32)
Calcium: 8.4 mg/dL — ABNORMAL LOW (ref 8.9–10.3)
Chloride: 104 mmol/L (ref 98–111)
Creatinine, Ser: 2.83 mg/dL — ABNORMAL HIGH (ref 0.44–1.00)
GFR, Estimated: 19 mL/min — ABNORMAL LOW (ref >60)
Glucose, Bld: 209 mg/dL — ABNORMAL HIGH (ref 70–99)
Potassium: 4.8 mmol/L (ref 3.5–5.1)
Sodium: 137 mmol/L (ref 135–145)
Total Bilirubin: 0.8 mg/dL (ref 0.0–1.2)
Total Protein: 5.2 g/dL — ABNORMAL LOW (ref 6.5–8.1)

## 2023-12-15 LAB — PROTIME-INR
INR: 0.9 (ref 0.8–1.2)
Prothrombin Time: 13.1 s (ref 11.4–15.2)

## 2023-12-15 LAB — CBC
HCT: 25 % — ABNORMAL LOW (ref 36.0–46.0)
Hemoglobin: 7.5 g/dL — ABNORMAL LOW (ref 12.0–15.0)
MCH: 29.6 pg (ref 26.0–34.0)
MCHC: 30 g/dL (ref 30.0–36.0)
MCV: 98.8 fL (ref 80.0–100.0)
Platelets: 316 K/uL (ref 150–400)
RBC: 2.53 MIL/uL — ABNORMAL LOW (ref 3.87–5.11)
RDW: 15.3 % (ref 11.5–15.5)
WBC: 10.1 K/uL (ref 4.0–10.5)
nRBC: 0 % (ref 0.0–0.2)

## 2023-12-15 LAB — APTT: aPTT: 23 s — ABNORMAL LOW (ref 24–36)

## 2023-12-15 LAB — ETHANOL: Alcohol, Ethyl (B): <15 mg/dL (ref <15)

## 2023-12-15 MED ORDER — SODIUM CHLORIDE 0.9% FLUSH
3.0000 mL | Freq: Once | INTRAVENOUS | Status: AC
  Administered 2023-12-16: 3 mL via INTRAVENOUS

## 2023-12-15 NOTE — ED Triage Notes (Signed)
 Pt arrives POV c/o slurred speech that started today when she woke up at 0930. Pt was seen at another hospital today for sx and had a full stroke workup with an MRI. Pt was discharged at 1630 and went home and noticed that her speech was worsening at 1700. Pt has also had increased swelling bil ankles for a couple days. Appropriate orders placed in triage per EDP.

## 2023-12-16 ENCOUNTER — Emergency Department (HOSPITAL_COMMUNITY)

## 2023-12-16 ENCOUNTER — Encounter (HOSPITAL_COMMUNITY): Payer: Self-pay | Admitting: Internal Medicine

## 2023-12-16 ENCOUNTER — Observation Stay (HOSPITAL_COMMUNITY)

## 2023-12-16 DIAGNOSIS — N179 Acute kidney failure, unspecified: Secondary | ICD-10-CM | POA: Diagnosis not present

## 2023-12-16 DIAGNOSIS — I639 Cerebral infarction, unspecified: Secondary | ICD-10-CM | POA: Diagnosis not present

## 2023-12-16 DIAGNOSIS — Z794 Long term (current) use of insulin: Secondary | ICD-10-CM

## 2023-12-16 DIAGNOSIS — Z940 Kidney transplant status: Secondary | ICD-10-CM

## 2023-12-16 DIAGNOSIS — E1122 Type 2 diabetes mellitus with diabetic chronic kidney disease: Secondary | ICD-10-CM

## 2023-12-16 DIAGNOSIS — E1151 Type 2 diabetes mellitus with diabetic peripheral angiopathy without gangrene: Secondary | ICD-10-CM

## 2023-12-16 DIAGNOSIS — R29703 NIHSS score 3: Secondary | ICD-10-CM

## 2023-12-16 DIAGNOSIS — N184 Chronic kidney disease, stage 4 (severe): Secondary | ICD-10-CM | POA: Diagnosis not present

## 2023-12-16 DIAGNOSIS — Z8679 Personal history of other diseases of the circulatory system: Secondary | ICD-10-CM

## 2023-12-16 DIAGNOSIS — D649 Anemia, unspecified: Secondary | ICD-10-CM | POA: Diagnosis not present

## 2023-12-16 DIAGNOSIS — N1832 Chronic kidney disease, stage 3b: Secondary | ICD-10-CM

## 2023-12-16 DIAGNOSIS — I6389 Other cerebral infarction: Secondary | ICD-10-CM | POA: Diagnosis not present

## 2023-12-16 DIAGNOSIS — F17210 Nicotine dependence, cigarettes, uncomplicated: Secondary | ICD-10-CM

## 2023-12-16 DIAGNOSIS — E785 Hyperlipidemia, unspecified: Secondary | ICD-10-CM

## 2023-12-16 LAB — I-STAT CHEM 8, ED
BUN: 39 mg/dL — ABNORMAL HIGH (ref 6–20)
Calcium, Ion: 1.12 mmol/L — ABNORMAL LOW (ref 1.15–1.40)
Chloride: 103 mmol/L (ref 98–111)
Creatinine, Ser: 2.9 mg/dL — ABNORMAL HIGH (ref 0.44–1.00)
Glucose, Bld: 208 mg/dL — ABNORMAL HIGH (ref 70–99)
HCT: 25 % — ABNORMAL LOW (ref 36.0–46.0)
Hemoglobin: 8.5 g/dL — ABNORMAL LOW (ref 12.0–15.0)
Potassium: 4.8 mmol/L (ref 3.5–5.1)
Sodium: 137 mmol/L (ref 135–145)
TCO2: 24 mmol/L (ref 22–32)

## 2023-12-16 LAB — FOLATE: Folate: 10.4 ng/mL (ref >5.9)

## 2023-12-16 LAB — CBC WITH DIFFERENTIAL/PLATELET
Abs Immature Granulocytes: 0.04 K/uL (ref 0.00–0.07)
Basophils Absolute: 0 K/uL (ref 0.0–0.1)
Basophils Relative: 0 %
Eosinophils Absolute: 0.1 K/uL (ref 0.0–0.5)
Eosinophils Relative: 2 %
HCT: 24.2 % — ABNORMAL LOW (ref 36.0–46.0)
Hemoglobin: 7.3 g/dL — ABNORMAL LOW (ref 12.0–15.0)
Immature Granulocytes: 1 %
Lymphocytes Relative: 13 %
Lymphs Abs: 1.2 K/uL (ref 0.7–4.0)
MCH: 29.6 pg (ref 26.0–34.0)
MCHC: 30.2 g/dL (ref 30.0–36.0)
MCV: 98 fL (ref 80.0–100.0)
Monocytes Absolute: 1 K/uL (ref 0.1–1.0)
Monocytes Relative: 12 %
Neutro Abs: 6.4 K/uL (ref 1.7–7.7)
Neutrophils Relative %: 72 %
Platelets: 299 K/uL (ref 150–400)
RBC: 2.47 MIL/uL — ABNORMAL LOW (ref 3.87–5.11)
RDW: 15.4 % (ref 11.5–15.5)
WBC: 8.8 K/uL (ref 4.0–10.5)
nRBC: 0 % (ref 0.0–0.2)

## 2023-12-16 LAB — RETICULOCYTES
Immature Retic Fract: 24.2 % — ABNORMAL HIGH (ref 2.3–15.9)
RBC.: 2.53 MIL/uL — ABNORMAL LOW (ref 3.87–5.11)
Retic Count, Absolute: 145.7 K/uL (ref 19.0–186.0)
Retic Ct Pct: 5.8 % — ABNORMAL HIGH (ref 0.4–3.1)

## 2023-12-16 LAB — COMPREHENSIVE METABOLIC PANEL WITH GFR
ALT: 34 U/L (ref 0–44)
AST: 16 U/L (ref 15–41)
Albumin: 2.6 g/dL — ABNORMAL LOW (ref 3.5–5.0)
Alkaline Phosphatase: 100 U/L (ref 38–126)
Anion gap: 11 (ref 5–15)
BUN: 41 mg/dL — ABNORMAL HIGH (ref 6–20)
CO2: 23 mmol/L (ref 22–32)
Calcium: 8.5 mg/dL — ABNORMAL LOW (ref 8.9–10.3)
Chloride: 105 mmol/L (ref 98–111)
Creatinine, Ser: 2.61 mg/dL — ABNORMAL HIGH (ref 0.44–1.00)
GFR, Estimated: 21 mL/min — ABNORMAL LOW (ref >60)
Glucose, Bld: 82 mg/dL (ref 70–99)
Potassium: 4.5 mmol/L (ref 3.5–5.1)
Sodium: 139 mmol/L (ref 135–145)
Total Bilirubin: 0.8 mg/dL (ref 0.0–1.2)
Total Protein: 4.8 g/dL — ABNORMAL LOW (ref 6.5–8.1)

## 2023-12-16 LAB — IRON AND TIBC
Iron: 51 ug/dL (ref 28–170)
Saturation Ratios: 18 % (ref 10.4–31.8)
TIBC: 284 ug/dL (ref 250–450)
UIBC: 233 ug/dL

## 2023-12-16 LAB — LIPID PANEL
Cholesterol: 193 mg/dL (ref 0–200)
HDL: 33 mg/dL — ABNORMAL LOW (ref >40)
LDL Cholesterol: 108 mg/dL — ABNORMAL HIGH (ref 0–99)
Total CHOL/HDL Ratio: 5.8 ratio
Triglycerides: 259 mg/dL — ABNORMAL HIGH (ref <150)
VLDL: 52 mg/dL — ABNORMAL HIGH (ref 0–40)

## 2023-12-16 LAB — APTT: aPTT: <22 s — ABNORMAL LOW (ref 24–36)

## 2023-12-16 LAB — CK: Total CK: 224 U/L (ref 38–234)

## 2023-12-16 LAB — PROTIME-INR
INR: 0.9 (ref 0.8–1.2)
Prothrombin Time: 12.7 s (ref 11.4–15.2)

## 2023-12-16 LAB — TYPE AND SCREEN
ABO/RH(D): A POS
Antibody Screen: NEGATIVE

## 2023-12-16 LAB — GLUCOSE, CAPILLARY: Glucose-Capillary: 175 mg/dL — ABNORMAL HIGH (ref 70–99)

## 2023-12-16 LAB — MAGNESIUM: Magnesium: 1.8 mg/dL (ref 1.7–2.4)

## 2023-12-16 LAB — I-STAT CG4 LACTIC ACID, ED: Lactic Acid, Venous: 1.2 mmol/L (ref 0.5–1.9)

## 2023-12-16 LAB — VITAMIN B12: Vitamin B-12: 185 pg/mL (ref 180–914)

## 2023-12-16 LAB — CBG MONITORING, ED: Glucose-Capillary: 167 mg/dL — ABNORMAL HIGH (ref 70–99)

## 2023-12-16 LAB — HEMOGLOBIN AND HEMATOCRIT, BLOOD
HCT: 25.4 % — ABNORMAL LOW (ref 36.0–46.0)
Hemoglobin: 7.8 g/dL — ABNORMAL LOW (ref 12.0–15.0)

## 2023-12-16 LAB — FERRITIN: Ferritin: 193 ng/mL (ref 11–307)

## 2023-12-16 MED ORDER — ACETAMINOPHEN 325 MG PO TABS: 650.0000 mg | ORAL_TABLET | Freq: Four times a day (QID) | ORAL | Status: DC | PRN

## 2023-12-16 MED ORDER — MYCOPHENOLATE MOFETIL 250 MG PO CAPS
500.0000 mg | ORAL_CAPSULE | Freq: Two times a day (BID) | ORAL | Status: DC
  Administered 2023-12-16 – 2023-12-17 (×3): 500 mg via ORAL
  Filled 2023-12-16 (×3): qty 2

## 2023-12-16 MED ORDER — ONDANSETRON HCL 4 MG/2ML IJ SOLN: 4.0000 mg | Freq: Four times a day (QID) | INTRAMUSCULAR | Status: DC | PRN

## 2023-12-16 MED ORDER — TACROLIMUS 1 MG PO CAPS
1.0000 mg | ORAL_CAPSULE | Freq: Two times a day (BID) | ORAL | Status: DC
Start: 2023-12-16 — End: 2023-12-17
  Administered 2023-12-16 – 2023-12-17 (×3): 1 mg via ORAL
  Filled 2023-12-16 (×3): qty 1

## 2023-12-16 MED ORDER — HYDROCORTISONE ACETATE 25 MG RE SUPP
25.0000 mg | Freq: Two times a day (BID) | RECTAL | Status: DC
  Administered 2023-12-16 – 2023-12-17 (×2): 25 mg via RECTAL
  Filled 2023-12-16 (×5): qty 1

## 2023-12-16 MED ORDER — ZOLPIDEM TARTRATE 5 MG PO TABS
5.0000 mg | ORAL_TABLET | Freq: Every evening | ORAL | Status: AC | PRN
  Administered 2023-12-16: 5 mg via ORAL
  Filled 2023-12-16: qty 1

## 2023-12-16 MED ORDER — STROKE: EARLY STAGES OF RECOVERY BOOK
Freq: Once | Status: AC
  Filled 2023-12-16: qty 1

## 2023-12-16 MED ORDER — PREDNISONE 5 MG PO TABS
5.0000 mg | ORAL_TABLET | Freq: Every day | ORAL | Status: DC
  Administered 2023-12-16 – 2023-12-17 (×2): 5 mg via ORAL
  Filled 2023-12-16 (×2): qty 1

## 2023-12-16 MED ORDER — MELATONIN 3 MG PO TABS: 3.0000 mg | ORAL_TABLET | Freq: Every evening | ORAL | Status: DC | PRN

## 2023-12-16 MED ORDER — CLOPIDOGREL BISULFATE 75 MG PO TABS
75.0000 mg | ORAL_TABLET | Freq: Every day | ORAL | Status: DC
  Administered 2023-12-16: 75 mg via ORAL
  Filled 2023-12-16: qty 1

## 2023-12-16 MED ORDER — INSULIN ASPART 100 UNIT/ML IJ SOLN
0.0000 [IU] | Freq: Four times a day (QID) | INTRAMUSCULAR | Status: DC
  Administered 2023-12-16 (×2): 2 [IU] via SUBCUTANEOUS
  Administered 2023-12-17: 1 [IU] via SUBCUTANEOUS

## 2023-12-16 MED ORDER — ASPIRIN 81 MG PO TBEC
81.0000 mg | DELAYED_RELEASE_TABLET | Freq: Every day | ORAL | Status: DC
  Administered 2023-12-16 – 2023-12-17 (×2): 81 mg via ORAL
  Filled 2023-12-16 (×2): qty 1

## 2023-12-16 MED ORDER — EZETIMIBE 10 MG PO TABS
10.0000 mg | ORAL_TABLET | Freq: Every day | ORAL | Status: DC
  Administered 2023-12-17: 10 mg via ORAL
  Filled 2023-12-16: qty 1

## 2023-12-16 MED ORDER — LABETALOL HCL 5 MG/ML IV SOLN: 10.0000 mg | INTRAVENOUS | Status: DC | PRN

## 2023-12-16 MED ORDER — SODIUM BICARBONATE 650 MG PO TABS
1300.0000 mg | ORAL_TABLET | Freq: Three times a day (TID) | ORAL | Status: DC
  Administered 2023-12-16 – 2023-12-17 (×3): 1300 mg via ORAL
  Filled 2023-12-16 (×4): qty 2

## 2023-12-16 MED ORDER — ACETAMINOPHEN 650 MG RE SUPP
650.0000 mg | Freq: Four times a day (QID) | RECTAL | Status: DC | PRN
Start: 2023-12-16 — End: 2023-12-17

## 2023-12-16 NOTE — Procedures (Signed)
 Modified Barium Swallow Study  Patient Details  Name: Isabella Obrien MRN: [id number] Date of Birth: 16-Jun-1965  Today's Date: 12/16/2023  Modified Barium Swallow completed.  Full report located under Chart Review in the Imaging Section.  History of Present Illness Isabella Obrien is a 58 y.o. female with medical history significant for acute thalamic stroke in May 2025, renal cell transplant on chronic immunosuppressive/steroid therapy, type 2 diabetes mellitus [facility], CKD stage IV baseline creatinine 1.8-2.5, who is admitted to [facility] on 12/15/2023 with acute ischemic CVA suspected dx on the basis of acute onset of dysarthria, dysphagia, with LKW appearing to be approximately 2200 on 12/14/2023, with MRI brain performed today showing evidence of acute infarction of the right corona radiata.  Preceding CT head showed evidence of acute intracranial process.  Given timing of last known well, she is outside of the window for consideration for the PA and is also outside of the window for consideration for thrombectomy.   Clinical Impression Pt presents with a mild oropharyngeal dysphagia per results of MBSS completed today. No aspiration observed across PO trials.   Oral deficits characterized by reduced tongue strength and propulsion resulting in pt tilting head backwards to initiate swallow of thin and nectar-thick liquids by cup. There was trace-min oral, base of tongue, and vallecular residue following initial swallow, which was reduced with second spontaneous dry swallow. Pt challenged with initiating oral transit of barium tablet despite multiple sips of thin liquids. She eventually expectorated the tablet.   Pharyngeal deficits included reduced base of tongue retraction, reduced hyoid excursion, incomplete epiglottic inversion, and reduced laryngeal vestibule closure, and reduced pharyngeal stripping. There was fairly consistent scant, shallow, transient penetration of  thin and nectar-thick liquids before/during the swallow. No penetration of solids observed.   Concerns for esophageal dysphagia included bolus rentention of pudding and solid trials in the mid-upper esophagus. Residue did mostly clear with liquid wash. There was evidence of a CP bar just below the UES, however, did not appear to impact bolus flow.   Recommend continue current diet as tolerated. SLP reviewed lingual exercises that may help with lingual strength and propulsion if completed consistently. SLP will continue to follow pt for diet tolerance management and oral exercises during pt's acute hospitalization.      Swallow Evaluation Recommendations Recommendations: PO diet PO Diet Recommendation: Regular;Thin liquids (Level 0) Liquid Administration via: Cup;Straw Medication Administration: Whole meds with puree (as tolerated) Supervision: Patient able to self-feed Swallowing strategies  : Slow rate;Small bites/sips Postural changes: Position pt fully upright for meals;Stay upright 30-60 min after meals Oral care recommendations: Oral care BID (2x/day) Recommended consults: Consider GI consultation (if pt experiences significant globus senstion, backflow, or other discomfort with PO intake)      Isabella Obrien 12/16/2023,9:41 AM

## 2023-12-16 NOTE — ED Notes (Signed)
 PT at bedside.

## 2023-12-16 NOTE — H&P (Signed)
 History and Physical      Isabella Obrien FMW:[id number] DOB: November 29, 1965 DOA: 12/15/2023; DOS: 12/16/2023  PCP: Rolinda Millman, MD  Patient coming from: home   I have personally briefly reviewed patient's old medical records in [facility]  Chief Complaint: Slurred speech  HPI: Isabella Obrien is a 59 y.o. 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 ED complaining of slurred speech.   Patient reports that she went to bed around 2200 on 12/14/2023, before awakening on the morning of 12/15/23 with interval development of slurred speech as well as difficulty swallowing.  Denies any associated acute focal weakness.  No acute focal numbness, paresthesias.  Denies any associated acute visual changes, facial droop, or vertigo.  No recent chest pain or shortness of breath.  In the setting of the above symptoms, she presented to [facility], where MRI brain is reported to have show no evidence of acute process, including no evidence of acute stroke.  She was discharged to home from the ED.  However, in the context of persistence of her symptoms, subsequent presented to [facility] emergency department for further evaluation management thereof.  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  Her medical history is notable renal transplant on chronic immunosuppressive therapy with CellCept , tacrolimus , prednisone .  She also has history of stage IV CKD with baseline creatinine 1.8-2.5, essential pretension, type 2 diabetes mellitus.  She also recently stopped smoking.  Additionally, per chart review, while her baseline hemoglobin appears to be in the range of 12-13, it appears that  hemoglobin has been trending down progressively since 11/30/2023, with most recent prior hemoglobin noted to be 8.2 on 12/04/2023.  Recently completed course of dual antiplatelet therapy with aspirin  and Brilinta .  Now is on full dose aspirin  daily.  Not a formal anticoagulation.  No known history of underlying atrial fibrillation.    ED Course:  Vital signs in the ED were notable for the following: Afebrile; heart rates in the 70s ED; systolic blood pressures in the 120s 130s; respiratory rate 17-21, oxygen saturation 94 to 90% on room air.  Labs were notable for the following: BMP was notable for creatinine 2.61 compared to 2.48 on 12/04/2023, glucose 82.  CBC notable for white cell count 8800, hemoglobin 7.3 associated with Neuraceq/Norocarp properties as well as nonelevated RDW, bili count 299.  Per my interpretation, EKG in ED demonstrated the following: Sinus rhythm with heart rate 84, normal intervals, nonspecific T wave inversion in aVF, nonspecific T wave and version in V2, V4, through V6, will demonstrate no evidence of ST changes, including no evidence of ST elevation.  Imaging in the ED, per corresponding formal radiology read, was notable for the following: Noncontrast CT head showed evidence of acute intracranial process, including no evidence of intracranial hemorrhage.  MRI brain without contrast showed acute infarct involving the right corona radiata.  EDP has discussed case with on-call neurology, Dr. Arora, who has formally consulted. Dr. Ruthine recommendations include DAPT with aspirin  and Plavix  and no need for repeat echo or A1c given that these studies were recently performed in May '25.   While in the ED, the following were administered: No IV fluids or medications were administered in  the ED today.  Subsequently, the patient was admitted for further evaluation management of acute ischemic stroke.     Review of Systems: As per HPI otherwise 10 point review of systems  negative.   Past Medical History:  Diagnosis Date   Chronic kidney disease    has kidney transplant - having aphoresis now every other week   Diabetes mellitus without complication (HCC)    dm type 2 - fasting 150   History of blood product transfusion    Hyperlipidemia    Hypertension    Menopausal hot flushes 09/06/2017   Controlled on Lexapro  by Dr. Marget   Neuromuscular disorder [facility])     Past Surgical History:  Procedure Laterality Date   COLONOSCOPY     diateck catheter placement  09/2013   KIDNEY TRANSPLANT     ORIF ANKLE FRACTURE Left 2014   TEE WITHOUT CARDIOVERSION N/A 01/13/2023   Procedure: TRANSESOPHAGEAL ECHOCARDIOGRAM;  Surgeon: Santo Stanly LABOR, MD;  Location: [facility] INVASIVE CV LAB;  Service: Cardiovascular;  Laterality: N/A;    Social History:  reports that she has been smoking cigarettes. She started smoking about 38 years ago. She has a 0.8 pack-year smoking history. She has never used smokeless tobacco. She reports current alcohol use of about 1.0 standard drink of alcohol per week. She reports that she does not use drugs.   Allergies  Allergen Reactions   Cefuroxime Swelling and Other (See Comments)    Ceftin:hypertension   Benadryl [Diphenhydramine Hcl] Swelling, Anxiety and Other (See Comments)    Hyperactivity and angioedema, also   Tape Rash and Other (See Comments)    Paper tape is okay   Wound Dressing Adhesive Rash and Other (See Comments)    Paper tape is ok    Family History  Problem Relation Age of Onset   Hypertension Mother    Other Father 38       amyloidosis   Healthy Brother    Stroke Paternal Grandmother     Family history reviewed and not pertinent    Prior to Admission medications   Medication Sig Start Date End Date Taking? Authorizing Provider  acetaminophen  (TYLENOL ) 500 MG tablet Take 1,000 mg by mouth every 6 (six) hours as needed for headache or mild pain (pain score 1-3).    [provider]  albuterol   (PROVENTIL ) (2.5 MG/3ML) 0.083% nebulizer solution Take 3 mLs (2.5 mg total) by nebulization 2 (two) times daily as needed for wheezing or shortness of breath. 01/29/21   Jodie Lavern CROME, MD  albuterol  (VENTOLIN  HFA) [phone] Base) MCG/ACT inhaler Inhale 2 puffs into the lungs every 6 (six) hours as needed for wheezing or shortness of breath. 11/03/20   Jodie Lavern CROME, MD  amLODipine (NORVASC) 5 MG tablet Take 5 mg by mouth daily. 08/02/23   [provider]  aspirin  EC 325 MG tablet Take 1 tablet (325 mg total) by mouth daily. 10/14/23 10/13/24  Ghimire, Kuber, MD  atenolol  (TENORMIN ) 100 MG tablet Take 100 mg by mouth at bedtime.    [provider]  buPROPion  (WELLBUTRIN  XL) 150 MG 24 hr tablet Take 1 tablet (150 mg total) by mouth daily. 08/10/23   Dohmeier, Dedra, MD  calcitRIOL  (ROCALTROL ) 0.25 MCG capsule Take 0.25 mcg by mouth daily. 10/03/23   [provider]  fluticasone-salmeterol (WIXELA INHUB) 100-50 MCG/ACT AEPB Inhale 1 puff into the lungs 2 (two) times daily. Patient not taking: Reported on 11/29/2023    [provider]  furosemide  (LASIX )  40 MG tablet Take 1 tablet (40 mg total) by mouth daily as needed. Patient taking differently: Take 40 mg by mouth daily as needed for edema or fluid. 06/24/21   Jodie Lavern CROME, MD  glipiZIDE (GLUCOTROL XL) 10 MG 24 hr tablet Take 10 mg by mouth daily. 08/24/23   [provider]  hydrocortisone  (ANUSOL -HC) 2.5 % rectal cream Place 1 Application rectally 2 (two) times daily.    [provider]  mycophenolate  (CELLCEPT ) 500 MG tablet Take 500 mg by mouth in the morning and at bedtime.    [provider]  Omega-3 Fatty Acids (FISH OIL) 1000 MG CAPS Take 1,000 mg by mouth in the morning and at bedtime. 02/13/14   [provider]  predniSONE  (DELTASONE ) 5 MG tablet Take 5 mg by mouth daily with breakfast.    [provider]  sodium bicarbonate  650 MG tablet Take 1,300 mg by mouth 3 (three)  times daily.    [provider]  tacrolimus  (PROGRAF ) 1 MG capsule Take 1 mg by mouth in the morning and at bedtime.    [provider]  ticagrelor  (BRILINTA ) 90 MG TABS tablet Take 90 mg by mouth 2 (two) times daily. 10/14/23   [provider]  TRESIBA FLEXTOUCH 100 UNIT/ML FlexTouch Pen Inject 13 Units into the skin at bedtime.    [provider]  zolpidem  (AMBIEN ) 10 MG tablet Take 10 mg by mouth at bedtime. 12/06/21   [provider]     Objective    Physical Exam: Vitals:   12/16/23 0245 12/16/23 0330 12/16/23 0340 12/16/23 0345  BP:   116/66   Pulse: 66 88 66 65  Resp: 18 (!) 24 (!) 21 (!) 21  Temp:      SpO2: 94% 94% 93% 95%  Weight:      Height:        General: appears to be stated age; alert, oriented Skin: warm, dry, no rash Head:  AT/[place] Mouth:  Oral mucosa membranes appear moist, normal dentition Neck: supple; trachea midline Heart:  RRR; did not appreciate any M/R/G Lungs: CTAB, did not appreciate any wheezes, rales, or rhonchi Abdomen: + BS; soft, ND, NT Vascular: 2+ pedal pulses b/l; 2+ radial pulses b/l Extremities: no peripheral edema, no muscle wasting Neuro: Dysarthria noted, no tremors, normal muscle tone     Labs on Admission: I have personally reviewed following labs and imaging studies  CBC: Recent Labs  Lab 12/15/23 2255  WBC 10.1  NEUTROABS 7.9*  HGB 7.5*  HCT 25.0*  MCV 98.8  PLT 316   Basic Metabolic Panel: Recent Labs  Lab 12/15/23 2255  NA 137  K 4.8  CL 104  CO2 23  GLUCOSE 209*  BUN 41*  CREATININE 2.83*  CALCIUM  8.4*   GFR: Estimated Creatinine Clearance: 19.9 mL/min (A) (by C-G formula based on SCr of 2.83 mg/dL (H)). Liver Function Tests: Recent Labs  Lab 12/15/23 2255  AST 20  ALT 41  ALKPHOS 108  BILITOT 0.8  PROT 5.2*  ALBUMIN 2.7*   No results for input(s): LIPASE, AMYLASE in the last 168 hours. No results for input(s): AMMONIA in the last 168  hours. Coagulation Profile: Recent Labs  Lab 12/15/23 2255  INR 0.9   Cardiac Enzymes: Recent Labs  Lab 12/15/23 2255  CKTOTAL 224   BNP (last 3 results) No results for input(s): PROBNP in the last 8760 hours. HbA1C: No results for input(s): HGBA1C in the last 72 hours. CBG: Recent  Labs  Lab 12/15/23 2243  GLUCAP 191*   Lipid Profile: No results for input(s): CHOL, HDL, LDLCALC, TRIG, CHOLHDL, LDLDIRECT in the last 72 hours. Thyroid Function Tests: No results for input(s): TSH, T4TOTAL, FREET4, T3FREE, THYROIDAB in the last 72 hours. Anemia Panel: No results for input(s): VITAMINB12, FOLATE, FERRITIN, TIBC, IRON, RETICCTPCT in the last 72 hours. Urine analysis:    Component Value Date/Time   COLORURINE YELLOW 11/29/2023 1845   APPEARANCEUR HAZY (A) 11/29/2023 1845   LABSPEC 1.015 11/29/2023 1845   PHURINE 6.0 11/29/2023 1845   GLUCOSEU >=500 (A) 11/29/2023 1845   HGBUR LARGE (A) 11/29/2023 1845   BILIRUBINUR NEGATIVE 11/29/2023 1845   BILIRUBINUR Negative 01/25/2020 1354   KETONESUR NEGATIVE 11/29/2023 1845   PROTEINUR 100 (A) 11/29/2023 1845   UROBILINOGEN 0.2 01/25/2020 1354   UROBILINOGEN 0.2 12/20/2013 1220   NITRITE NEGATIVE 11/29/2023 1845   LEUKOCYTESUR NEGATIVE 11/29/2023 1845    Radiological Exams on Admission: MR BRAIN WO CONTRAST Result Date: 12/16/2023 CLINICAL DATA:  Neuro deficit, acute, stroke suspected EXAM: MRI HEAD WITHOUT CONTRAST TECHNIQUE: Multiplanar, multiecho pulse sequences of the brain and surrounding structures were obtained without intravenous contrast. COMPARISON:  CT head 12/15/2023. FINDINGS: Brain: Acute infarct in the right corona radiata. Slight edema without mass effect. Remote infarct in the left corona radiata. No midline shift or acute hemorrhage. No mass lesion or hydrocephalus. Cerebral atrophy. Vascular: Major arterial flow voids are maintained at the skull base. Skull and upper cervical  spine: Normal marrow signal. Sinuses/Orbits: Clear sinuses.  No acute orbital findings. Other: None. IMPRESSION: 1. Acute infarct in the right corona radiata. 2. Remote infarct in the left corona radiata. Electronically Signed   By: Gilmore GORMAN Molt M.D.   On: 12/16/2023 02:27   CT HEAD WO CONTRAST Result Date: 12/15/2023 EXAM: CT HEAD WITHOUT CONTRAST 12/15/2023 11:08:35 PM TECHNIQUE: CT of the head was performed without the administration of intravenous contrast. Automated exposure control, iterative reconstruction, and/or weight based adjustment of the mA/kV was utilized to reduce the radiation dose to as low as reasonably achievable. COMPARISON: 10/13/2023 CLINICAL HISTORY: Slurred speech. FINDINGS: BRAIN AND VENTRICLES: No acute hemorrhage. Gray-white differentiation is preserved. No hydrocephalus. No extra-axial collection. No mass effect or midline shift. Old lacunar infarct in the left corona radiata. Global cortical atrophy. Subcortical and periventricular small vessel ischemic changes. ORBITS: No acute abnormality. SINUSES: No acute abnormality. SOFT TISSUES AND SKULL: No acute soft tissue abnormality. No skull fracture. IMPRESSION: 1. No acute intracranial abnormality. 2. Old lacunar infarct in the left corona radiata. 3. Atrophy with small vessel ischemic changes. Electronically signed by: Pinkie Pebbles MD 12/15/2023 11:11 PM EDT RP Workstation: [id number]      Assessment/Plan   Principal Problem:   Acute ischemic stroke [facility]) Active Problems:   History of renal transplant   CKD (chronic kidney disease), stage IV (HCC)   DM2 (diabetes mellitus, type 2) (HCC)   Normocytic anemia   History of essential hypertension    #) Acute ischemic CVA: suspected dx on the basis of acute onset of dysarthria, dysphagia, with LKW appearing to be approximately 2200 on 12/14/2023, with MRI brain performed today showing evidence of acute infarction of the right corona radiata.  Preceding CT head  showed evidence of acute intracranial process.  Given timing of last known well, she is outside of the window for consideration for the PA and is also outside of the window for consideration for thrombectomy.  EDP has discussed case with on-call neurology, Dr. Arora, who  has formally consulted. Dr. Ruthine recommendations include DAPT with aspirin  and Plavix  and no need for repeat echo or A1c given that these studies were recently performed in May '25.   She expresses multiple modifiable ischemic CVA risk factors, including type 2 diabetes mellitus, essential pretension.  Presentation also associated with progressive subacute anemia, increasing risk for ischemia on the basis of supply/demand mismatch on this premise.    Plan: N.p.o. pending formal evaluation by ST, is ordered for this morning. ead of the bed at 30 degrees. Neuro checks per protocol. VS per protocol. Will allow for permissive hypertension for 48 hours during which will hold home antihypertensive medications, with prn IV labetalol ordered for SBP greater than 220 mmHg or DBP greater than 110 mmHg . Monitor on telemetry, including monitoring for atrial fibrillation as modifiable risk factor for acute ischemic CVA.   As noted above, will refrain from repeating hemoglobin A1c as well as echocardiogram given recent pursuit of the studies within the last 90 days.  echo given recent pursuit of the studies within the last 90 days.  will neurology check lipid panel and A1c. PT/OT/ST consults have been ordered for the morning.  Formally consulted, and stroke team will continue to follow.  Per neurology recommendation, will pursue dual antiplatelet therapy with daily baby aspirin  as well as Plavix  75 mg p.o. daily.                   #) subacute normocytic anemia: Relative to baseline hemoglobin range of 12-13, there is been progressive decline in hemoglobin over the last few weeks starting on 11/30/2023, with hemoglobin today noted to be  7.3, without any overt evidence of active bleed.  She recently completed course of Levophed therapy on daily baby aspirin  as well as Brilinta , with plan to initiate new course of dual antiplatelet therapy with daily baby aspirin  as well as Plavix  75 mg p.o. daily in the setting of new acute ischemic CVA, as above.  Will pursue additional evaluation for progressive subacute anemia, as below.  Plan: Add on the following studies: Iron studies, B12, folic acid level, PTT, INR, reticulocyte count.-Screen ordered.  Repeat CBC in the morning.                     #) Type 2 Diabetes Mellitus: documented history of such. Home insulin  regimen:  tresiba 13 units qhs. Home oral hypoglycemic agents:  none. presenting blood sugar: 82. Most recent A1c noted to be 8.7% in May 2025.  Plan: in the setting of current npo status, with pursue Accu-Cheks every 6 hours with associated low-dose sliding scale insulin .                         #) Essential Hypertension: documented h/o such, with outpatient antihypertensive regimen including atenolol , Norvasc.  S in setting of presenting acute ischemic CVA, will observe permissive hypertension, as above.  Plan: Hold home and hypertensive medications for now in setting of observance of permissive hypertension.  Prn IV labetalol, as above.                   #) CKD stage IV: Documented history of such, baseline creatinine 1.8-2.5, with presenting creatinine consistent with his baseline range.  On scheduled oral bicarbonate as an outpatient.  Plan: Monitor strict I's and O's and daily weights.  Pete CMP in the morning.  Continue outpatient sodium bicarbonate .                  #)  History of renal cell transplant: Chronically immunosuppressed on CellCept  as well as tacrolimus .  Is also on chronic daily prednisone  therapy 5 mg p.o. daily.  Plan: Continue outpatient immunosuppressive regimen.  Repeat CMP in the  morning.      ObservationDVT prophylaxis: SCD's   Code Status: Full code Family Communication: none Disposition Plan: Per Rounding Team Consults called: EDP has discussed case with on-call neurology, Dr. Arora, who has formally consulted. Dr. Ruthine recommendations include DAPT with aspirin  and Plavix  and no need for repeat echo or A1c given that these studies were recently performed in May '25. ;  Admission status: Observation     I SPENT GREATER THAN 75  MINUTES IN CLINICAL CARE TIME/MEDICAL DECISION-MAKING IN COMPLETING THIS ADMISSION.      Eva NOVAK Jivan Symanski DO [facility]  From 7PM - 7AM   12/16/2023, 5:01 AM

## 2023-12-16 NOTE — Progress Notes (Signed)
    Patient: Isabella Obrien FMW:[id number] DOB: February 03, 1966      Brief hospital course: 58 y.o. F with HTN, DM, ESRD s/p LDKT 2006 on Tacro/Cellcept , CKD IV baseline Cr 2, OSA, and recurrent stroke who presented with slurred speech and dysphagia.  [place] speech, went to the ER, but MRI showed no definitive stroke and she was discharged home.  Symptoms persisted so she presented to the hospital here, MRI confirmed right CR stroke.  Also noted to have Hgb down to 7.3 from 13 in May when DAPT was started.    This is a no charge note, for further details, please see the H&P by my partner, Dr. Marcene from earlier today.   Principal Problem:   Acute ischemic stroke (HCC) Active Problems:   History of renal transplant   CKD (chronic kidney disease), stage IV (HCC)   DM2 (diabetes mellitus, type 2) (HCC)   Normocytic anemia   History of essential hypertension    Acute ischemic stroke MRI brain showed small CR stroke.   Recent echo and carotid imaging, defer repeat Statin intolerant due to recent Rhabdo On aspirin  Discussed with Neuro, given #2 below, will hold further Plavix  for now  Consult PT SLP consulted, cleared for regular diet, meds in puree.    Anemia Rectal bleeding Hgb was 13 in May, steadily down since then.  Rectal bleeding started 1 month ago, steady since then.  Given her longstanding history severe hemorrhoids, she and her PCP have assumed it was related to hemorrhoids, which is likely.  Discussed with GI, they recommended conservative Mgmt with steroid suppositories and follow Hgb - Continue aspirin  81 - Hold Plavix  - Observe overnight - If >1g drop Hgb, will consult GI    History renal transplant Resolving renal failure CKD IV  Follows with Dr. Gearline.  Previous baseline had been ~2.    Admitted for statin-related rhabdomyolysis and renal failure earlier this month, Cr peaked at >4 at that time.    Was down to  2.3 on but slightly up today, in setting of anemia.  New baseline may be >2. - Continue MMF, pred, tac, bicarb - Monitor Cr - IV fluids   Hypertension BP soft - Hold amlodipine, atenolol  - Hold PRN furosemide   Mood - Hold Bupropion     COPD No flare, has ICS/LABA for PRN use  Diabetes - Hold glipizide    Physical Exam: BP 120/89 (BP Location: Right Arm)   Pulse 86   Temp 98.4 F (36.9 C) (Oral)   Resp (!) 27   Ht 5' 3 (1.6 m)   Wt 63.5 kg   LMP 10/24/2012   SpO2 92%   BMI 24.80 kg/m   Patient seen and examined.      Family Communication:          Author: Lonni SHAUNNA Dalton, MD 12/16/2023 4:32 PM

## 2023-12-16 NOTE — ED Provider Notes (Signed)
 [place] EMERGENCY DEPARTMENT AT [facility] Provider Note  CSN: [id number] Arrival date & time: 12/15/23 2238  Chief Complaint(s) Aphasia  HPI Isabella Obrien is a 58 y.o. female with a past medical history listed below including chronic kidney disease status post renal transplant on immunosuppressive's, hypertension, hyperlipidemia, prior stroke here for slurred speech noted this morning shortly after waking up.  It was noted by her husband who called her this morning.  She went to outside hospital in [facility] and reportedly had a reassuring workup with a negative MRI.  Shortly after discharge and getting home here in [place], her husband noted that her slurred speech had worsened.  This prompted her return to the emergency department here.  Patient denies any visual changes.  No weakness or loss of sensation.  She does report change in ability to swallow.  Patient had a recent admission to the hospital  The history is provided by the patient.    Past Medical History Past Medical History:  Diagnosis Date   Chronic kidney disease    has kidney transplant - having aphoresis now every other week   Diabetes mellitus without complication (HCC)    dm type 2 - fasting 150   History of blood product transfusion    Hyperlipidemia    Hypertension    Menopausal hot flushes 09/06/2017   Controlled on Lexapro  by Dr. Marget   Neuromuscular disorder [facility])    Patient Active Problem List   Diagnosis Date Noted   Rhabdomyolysis 11/30/2023   Adnexal mass 11/30/2023   AKI (acute kidney injury) (HCC) 11/29/2023   DM2 (diabetes mellitus, type 2) (HCC) 10/14/2023   Acute ischemic stroke (HCC) 10/13/2023   Severe obstructive sleep apnea-hypopnea syndrome 08/10/2023   Tobacco abuse 08/10/2023   OSA on CPAP 08/10/2023   Pain in left hand 02/25/2023   Mallet finger of left hand 02/25/2023   Ischemic stroke of frontal lobe (HCC) 01/11/2023   Insomnia 02/02/2020   Internal hemorrhoids  12/20/2017   Menopausal hot flushes 09/06/2017   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 Authorizing Provider  acetaminophen  (TYLENOL ) 500 MG tablet Take 1,000 mg by mouth every 6 (six) hours as needed for headache or mild pain (pain score 1-3).    [provider]  albuterol  (PROVENTIL ) (2.5 MG/3ML) 0.083% nebulizer solution Take 3 mLs (2.5 mg total) by nebulization 2 (two) times daily as needed for wheezing or shortness of breath. 01/29/21   Jodie Lavern CROME, MD  albuterol  (VENTOLIN  HFA) 108 (90 Base) MCG/ACT inhaler Inhale 2 puffs into the lungs every 6 (six) hours as needed for wheezing or shortness of breath. 11/03/20   Jodie Lavern CROME, MD  amLODipine (NORVASC) 5 MG tablet Take 5 mg by mouth daily. 08/02/23   [provider]  aspirin  EC 325 MG tablet Take 1 tablet (325 mg total) by mouth daily. 10/14/23 10/13/24  Raenelle Coria, MD  atenolol  (TENORMIN ) 100 MG tablet Take 100 mg by mouth at bedtime.    [provider]  buPROPion  (WELLBUTRIN  XL) 150 MG 24 hr tablet Take 1 tablet (150 mg total) by mouth daily. 08/10/23   Dohmeier, Dedra, MD  calcitRIOL  (ROCALTROL ) 0.25 MCG capsule Take 0.25 mcg by mouth daily. 10/03/23   [provider]  fluticasone-salmeterol (WIXELA INHUB) 100-50 MCG/ACT AEPB Inhale 1 puff into the lungs 2 (two) times daily. Patient not taking: Reported on 11/29/2023    [provider]   furosemide  (LASIX ) 40 MG tablet Take 1 tablet (40 mg total) by mouth daily as needed. Patient taking differently: Take 40 mg by mouth daily as needed for edema or fluid. 06/24/21   Jodie Lavern CROME, MD  glipiZIDE (GLUCOTROL XL) 10 MG 24 hr tablet Take 10 mg by mouth daily. 08/24/23   [provider]  hydrocortisone  (ANUSOL -HC) 2.5 % rectal cream Place 1 Application rectally 2 (two) times daily.    [provider]  mycophenolate  (CELLCEPT ) 500 MG tablet Take 500 mg by mouth in the morning and at bedtime.    [provider]  Omega-3 Fatty Acids (FISH OIL) 1000 MG CAPS Take 1,000 mg by mouth in the morning and at bedtime. 02/13/14   [provider]  predniSONE  (DELTASONE ) 5 MG tablet Take 5 mg by mouth daily with breakfast.    [provider]  sodium bicarbonate  650 MG tablet Take 1,300 mg by mouth 3 (three) times daily.    [provider]  tacrolimus  (PROGRAF ) 1 MG capsule Take 1 mg by mouth in the morning and at bedtime.    [provider]  ticagrelor  (BRILINTA ) 90 MG TABS tablet Take 90 mg by mouth 2 (two) times daily. 10/14/23   [provider]  TRESIBA FLEXTOUCH 100 UNIT/ML FlexTouch Pen Inject 13 Units into the skin at bedtime.    [provider]  zolpidem  (AMBIEN ) 10 MG tablet Take 10 mg by mouth at bedtime. 12/06/21   [provider]                                                                                                                                    Allergies Cefuroxime, Benadryl [diphenhydramine hcl], Tape, and Wound dressing adhesive  Review of Systems Review of Systems As noted in HPI  Physical Exam Vital Signs  I have reviewed the triage vital signs BP 116/66   Pulse 65   Temp 98.2 F (36.8 C)   Resp (!) 21   Ht 5' 3 (1.6 m)   Wt 63.5 kg   LMP 10/24/2012   SpO2 95%   BMI 24.80 kg/m   Physical Exam Vitals reviewed.  Constitutional:      General: She is not in acute  distress.    Appearance: She is well-developed. She is not diaphoretic.  HENT:     Head: Normocephalic and atraumatic.     Nose: Nose normal.  Eyes:     General: No scleral icterus.  Right eye: No discharge.        Left eye: No discharge.     Conjunctiva/sclera: Conjunctivae normal.     Pupils: Pupils are equal, round, and reactive to light.  Cardiovascular:     Rate and Rhythm: Normal rate and regular rhythm.     Heart sounds: No murmur heard.    No friction rub. No gallop.  Pulmonary:     Effort: Pulmonary effort is normal. No respiratory distress.     Breath sounds: Normal breath sounds. No stridor. No rales.  Abdominal:     General: There is no distension.     Palpations: Abdomen is soft.     Tenderness: There is no abdominal tenderness.  Musculoskeletal:        General: No tenderness.     Cervical back: Normal range of motion and neck supple.  Skin:    General: Skin is warm and dry.     Findings: No erythema or rash.  Neurological:     Mental Status: She is alert and oriented to person, place, and time.     Comments: Mental Status:  Alert and oriented to person, place, and time.  Attention and concentration normal.  Speech slurred  Recent memory is intact  Cranial Nerves:  II Visual Fields: Intact to confrontation. Visual fields intact. III, IV, VI: Pupils equal and reactive to light and near. Full eye movement with bidirectional nystagmus  V Facial Sensation: Normal. No weakness of masticatory muscles  VII: No facial weakness or asymmetry  VIII Auditory Acuity: Grossly normal  IX/X: The uvula is midline; the palate elevates symmetrically  XI: Normal sternocleidomastoid and trapezius strength  XII: The tongue is midline. No atrophy or fasciculations.   Motor System: Muscle Strength: 5/5 and symmetric in the upper and lower extremities. No pronation or drift.  Muscle Tone: Tone and muscle bulk are normal in the upper and lower extremities.  Coordination:  Intact finger-to-nose. No tremor.  Sensation: Intact to light touch  Gait: deferred      ED Results and Treatments Labs (all labs ordered are listed, but only abnormal results are displayed) Labs Reviewed  APTT - Abnormal; Notable for the following components:      Result Value   aPTT 23 (*)    All other components within normal limits  CBC - Abnormal; Notable for the following components:   RBC 2.53 (*)    Hemoglobin 7.5 (*)    HCT 25.0 (*)    All other components within normal limits  DIFFERENTIAL - Abnormal; Notable for the following components:   Neutro Abs 7.9 (*)    All other components within normal limits  COMPREHENSIVE METABOLIC PANEL WITH GFR - Abnormal; Notable for the following components:   Glucose, Bld 209 (*)    BUN 41 (*)    Creatinine, Ser 2.83 (*)    Calcium  8.4 (*)    Total Protein 5.2 (*)    Albumin 2.7 (*)    GFR, Estimated 19 (*)    All other components within normal limits  CBG MONITORING, ED - Abnormal; Notable for the following components:   Glucose-Capillary 191 (*)    All other components within normal limits  PROTIME-INR  ETHANOL  CK  LIPID PANEL  CBC WITH DIFFERENTIAL/PLATELET  COMPREHENSIVE METABOLIC PANEL WITH GFR  MAGNESIUM   I-STAT CHEM 8, ED  CBG MONITORING, ED  EKG  EKG Interpretation Date/Time:  Friday December 16 2023 03:37:04 EDT Ventricular Rate:  84 PR Interval:  124 QRS Duration:  83 QT Interval:  373 QTC Calculation: 441 R Axis:   57  Text Interpretation: Sinus rhythm RSR' in V1 or V2, probably normal variant Nonspecific T abnrm, anterolateral leads No significant change was found Confirmed by Trine Likes [phone]) on 12/16/2023 3:44:25 AM       Radiology MR BRAIN WO CONTRAST Result Date: 12/16/2023 CLINICAL DATA:  Neuro deficit, acute, stroke suspected EXAM: MRI HEAD WITHOUT CONTRAST TECHNIQUE:  Multiplanar, multiecho pulse sequences of the brain and surrounding structures were obtained without intravenous contrast. COMPARISON:  CT head 12/15/2023. FINDINGS: Brain: Acute infarct in the right corona radiata. Slight edema without mass effect. Remote infarct in the left corona radiata. No midline shift or acute hemorrhage. No mass lesion or hydrocephalus. Cerebral atrophy. Vascular: Major arterial flow voids are maintained at the skull base. Skull and upper cervical spine: Normal marrow signal. Sinuses/Orbits: Clear sinuses.  No acute orbital findings. Other: None. IMPRESSION: 1. Acute infarct in the right corona radiata. 2. Remote infarct in the left corona radiata. Electronically Signed   By: Gilmore GORMAN Molt M.D.   On: 12/16/2023 02:27   CT HEAD WO CONTRAST Result Date: 12/15/2023 EXAM: CT HEAD WITHOUT CONTRAST 12/15/2023 11:08:35 PM TECHNIQUE: CT of the head was performed without the administration of intravenous contrast. Automated exposure control, iterative reconstruction, and/or weight based adjustment of the mA/kV was utilized to reduce the radiation dose to as low as reasonably achievable. COMPARISON: 10/13/2023 CLINICAL HISTORY: Slurred speech. FINDINGS: BRAIN AND VENTRICLES: No acute hemorrhage. Gray-white differentiation is preserved. No hydrocephalus. No extra-axial collection. No mass effect or midline shift. Old lacunar infarct in the left corona radiata. Global cortical atrophy. Subcortical and periventricular small vessel ischemic changes. ORBITS: No acute abnormality. SINUSES: No acute abnormality. SOFT TISSUES AND SKULL: No acute soft tissue abnormality. No skull fracture. IMPRESSION: 1. No acute intracranial abnormality. 2. Old lacunar infarct in the left corona radiata. 3. Atrophy with small vessel ischemic changes. Electronically signed by: Pinkie Pebbles MD 12/15/2023 11:11 PM EDT RP Workstation: [id number]    Medications Ordered in ED Medications  acetaminophen  (TYLENOL )  tablet 650 mg (has no administration in time range)    Or  acetaminophen  (TYLENOL ) suppository 650 mg (has no administration in time range)   stroke: early stages of recovery book (has no administration in time range)  labetalol (NORMODYNE) injection 10 mg (has no administration in time range)  aspirin  EC tablet 81 mg (has no administration in time range)  clopidogrel  (PLAVIX ) tablet 75 mg (has no administration in time range)  melatonin tablet 3 mg (has no administration in time range)  ondansetron  (ZOFRAN ) injection 4 mg (has no administration in time range)  sodium chloride  flush (NS) 0.9 % injection 3 mL (3 mLs Intravenous Given 12/16/23 0407)   Procedures Procedures  (including critical care time) Medical Decision Making / ED Course   Medical Decision Making Amount and/or Complexity of Data Reviewed Labs: ordered. Radiology: ordered. ECG/medicine tests: ordered and independent interpretation performed. Decision-making details documented in ED Course.  Risk Decision regarding hospitalization.    Worsening slurred speech History of stroke Will get MRI to confirm assess for acute stroke Labs to look for electrolyte derangements.  CBC without leukocytosis.  Anemia with a hemoglobin of 7.5 slightly decreased from prior. CMP without significant electrolyte derangements.  Hyperglycemia without DKA.  Patient with renal insufficiency and slight AKI from prior CK  obtained given recent history of rhabdo and slight AKI which was normal. CT head showed remote left lacunar infarct.  MRI notable for acute right lacunar infarct Consulted neurology and spoke with Dr. Deedra.  He will provide recommendations. Patient admitted to hospitalist service for further management     Final Clinical Impression(s) / ED Diagnoses Final diagnoses:  Acute stroke due to ischemia [facility])  AKI (acute kidney injury) (HCC)  Hypoalbuminemia    This chart was dictated using voice recognition software.   Despite best efforts to proofread,  errors can occur which can change the documentation meaning.    Trine Raynell Moder, MD 12/16/23 [phone]

## 2023-12-16 NOTE — Consult Note (Addendum)
 NEUROLOGY CONSULT NOTE   Date of service: December 16, 2023 Patient Name: Isabella Obrien MRN:  [id number] DOB:  August 20, 1965 Chief Complaint: Slurred speech Requesting Provider: Trine Raynell Moder, *  History of Present Illness  Isabella Obrien is a 58 y.o. female with hx of DM, HTN, HLD, CKD status post kidney transplant on immunosuppression, presented to the emergency department for evaluation of slurred speech that started around 9:30 AM today.  She went to a hospital at [facility] [place] she says an MRI was done and there was some concern for a possible stroke but not a definitive stroke and she was discharged home.  The symptoms did not get better and her slurred speech kept getting worse along with some dysphagia which made her come to the hospital here at [facility] health. MRI brain was done which revealed an acute infarct in the right corona radiata with remote left corona radiata infarct.  She had a suspected small lacunar right thalamic infarct in May 2025 for which she was seen by the stroke team and recommended to be on Brilinta  for [redacted] weeks along with aspirin  after that aspirin  only-which she is compliant with. Her statin was discontinued during her recent admission due to rhabdomyolysis.  LKW: 9:30 AM Modified rankin score: 1-No significant post stroke disability and can perform usual duties with stroke symptoms IV Thrombolysis: No-outside window EVT: No clinical features of LVO  NIHSS components Score: Comment  1a Level of Conscious 0[x]  1[]  2[]  3[]      1b LOC Questions 0[x]  1[]  2[]       1c LOC Commands 0[x]  1[]  2[]       2 Best Gaze 0[x]  1[]  2[]       3 Visual 0[x]  1[]  2[]  3[]      4 Facial Palsy 0[x]  1[]  2[]  3[]      5a Motor Arm - left 0[x]  1[]  2[]  3[]  4[]  UN[]    5b Motor Arm - Right 0[x]  1[]  2[]  3[]  4[]  UN[]    6a Motor Leg - Left 0[]  1[x]  2[]  3[]  4[]  UN[]    6b Motor Leg - Right 0[]  1[x]  2[]  3[]  4[]  UN[]    7 Limb Ataxia 0[x]  1[]  2[]  UN[]      8  Sensory 0[x]  1[]  2[]  UN[]      9 Best Language 0[x]  1[]  2[]  3[]      10 Dysarthria 0[]  1[x]  2[]  UN[]      11 Extinct. and Inattention 0[x]  1[]  2[]       TOTAL: 3      ROS   Comprehensive ROS performed and pertinent positives documented in HPI  Past History   Past Medical History:  Diagnosis Date   Chronic kidney disease    has kidney transplant - having aphoresis now every other week   Diabetes mellitus without complication (HCC)    dm type 2 - fasting 150   History of blood product transfusion    Hyperlipidemia    Hypertension    Menopausal hot flushes 09/06/2017   Controlled on Lexapro  by Dr. Marget   Neuromuscular disorder [facility])     Past Surgical History:  Procedure Laterality Date   COLONOSCOPY     diateck catheter placement  09/2013   KIDNEY TRANSPLANT     ORIF ANKLE FRACTURE Left 2014   TEE WITHOUT CARDIOVERSION N/A 01/13/2023   Procedure: TRANSESOPHAGEAL ECHOCARDIOGRAM;  Surgeon: Santo Stanly LABOR, MD;  Location: [facility] INVASIVE CV LAB;  Service: Cardiovascular;  Laterality: N/A;    Family History: Family History  Problem Relation Age of Onset   Hypertension Mother    Other Father 37       amyloidosis   Healthy Brother    Stroke Paternal Grandmother     Social History  reports that she has been smoking cigarettes. She started smoking about 38 years ago. She has a 0.8 pack-year smoking history. She has never used smokeless tobacco. She reports current alcohol use of about 1.0 standard drink of alcohol per week. She reports that she does not use drugs.  Allergies  Allergen Reactions   Cefuroxime Swelling and Other (See Comments)    Ceftin:hypertension   Benadryl [Diphenhydramine Hcl] Swelling, Anxiety and Other (See Comments)    Hyperactivity and angioedema, also   Tape Rash and Other (See Comments)    Paper tape is okay   Wound Dressing Adhesive Rash and Other (See Comments)    Paper tape is ok    Medications  No current facility-administered medications  for this encounter.  Current Outpatient Medications:    acetaminophen  (TYLENOL ) 500 MG tablet, Take 1,000 mg by mouth every 6 (six) hours as needed for headache or mild pain (pain score 1-3)., Disp: , Rfl:    albuterol  (PROVENTIL ) (2.5 MG/3ML) 0.083% nebulizer solution, Take 3 mLs (2.5 mg total) by nebulization 2 (two) times daily as needed for wheezing or shortness of breath., Disp: 150 mL, Rfl: 1   albuterol  (VENTOLIN  HFA) 108 (90 Base) MCG/ACT inhaler, Inhale 2 puffs into the lungs every 6 (six) hours as needed for wheezing or shortness of breath., Disp: 1 each, Rfl: 5   amLODipine (NORVASC) 5 MG tablet, Take 5 mg by mouth daily., Disp: , Rfl:    aspirin  EC 325 MG tablet, Take 1 tablet (325 mg total) by mouth daily., Disp: 90 tablet, Rfl: 3   atenolol  (TENORMIN ) 100 MG tablet, Take 100 mg by mouth at bedtime., Disp: , Rfl:    buPROPion  (WELLBUTRIN  XL) 150 MG 24 hr tablet, Take 1 tablet (150 mg total) by mouth daily., Disp: 30 tablet, Rfl: 3   calcitRIOL  (ROCALTROL ) 0.25 MCG capsule, Take 0.25 mcg by mouth daily., Disp: , Rfl:    fluticasone-salmeterol (WIXELA INHUB) 100-50 MCG/ACT AEPB, Inhale 1 puff into the lungs 2 (two) times daily. (Patient not taking: Reported on 11/29/2023), Disp: , Rfl:    furosemide  (LASIX ) 40 MG tablet, Take 1 tablet (40 mg total) by mouth daily as needed. (Patient taking differently: Take 40 mg by mouth daily as needed for edema or fluid.), Disp: 30 tablet, Rfl: 3   glipiZIDE (GLUCOTROL XL) 10 MG 24 hr tablet, Take 10 mg by mouth daily., Disp: , Rfl:    hydrocortisone  (ANUSOL -HC) 2.5 % rectal cream, Place 1 Application rectally 2 (two) times daily., Disp: , Rfl:    mycophenolate  (CELLCEPT ) 500 MG tablet, Take 500 mg by mouth in the morning and at bedtime., Disp: , Rfl:    Omega-3 Fatty Acids (FISH OIL) 1000 MG CAPS, Take 1,000 mg by mouth in the morning and at bedtime., Disp: , Rfl:    predniSONE  (DELTASONE ) 5 MG tablet, Take 5 mg by mouth daily with breakfast., Disp: ,  Rfl:    sodium bicarbonate  650 MG tablet, Take 1,300 mg by mouth 3 (three) times daily., Disp: , Rfl:    tacrolimus  (PROGRAF ) 1 MG capsule, Take 1 mg by mouth in the morning and at bedtime., Disp: , Rfl:    ticagrelor  (BRILINTA ) 90 MG TABS tablet, Take 90 mg by mouth 2 (two) times daily., Disp: ,  Rfl:    TRESIBA FLEXTOUCH 100 UNIT/ML FlexTouch Pen, Inject 13 Units into the skin at bedtime., Disp: , Rfl:    zolpidem  (AMBIEN ) 10 MG tablet, Take 10 mg by mouth at bedtime., Disp: , Rfl:   Vitals   Vitals:   12/16/23 0245 12/16/23 0330 12/16/23 0340 12/16/23 0345  BP:   116/66   Pulse: 66 88 66 65  Resp: 18 (!) 24 (!) 21 (!) 21  Temp:      SpO2: 94% 94% 93% 95%  Weight:      Height:        Body mass index is 24.8 kg/m.   Physical Exam   General: Awake alert in no distress HEENT: Normocephalic atraumatic Lungs: Clear Cardiovascular: Regular rate rhythm Neurological exam Awake alert oriented x 3 Dysarthric speech No aphasia Cranial nerves 2-12 intact Motor examination with drift in bilateral lower extremities.  No drift in the upper extremities Sensation intact to light touch Coordination intact  Labs/Imaging/Neurodiagnostic studies   CBC:  Recent Labs  Lab 01/06/2024 2255  WBC 10.1  NEUTROABS 7.9*  HGB 7.5*  HCT 25.0*  MCV 98.8  PLT 316   Basic Metabolic Panel:  Lab Results  Component Value Date   NA 137 January 06, 2024   K 4.8 06-Jan-2024   CO2 23 January 06, 2024   GLUCOSE 209 (H) 2024-01-06   BUN 41 (H) 01-06-24   CREATININE 2.83 (H) 2024/01/06   CALCIUM  8.4 (L) 06-Jan-2024   GFRNONAA 19 (L) 01/06/2024   GFRAA 56 (L) 01/25/2020   Lipid Panel:  Lab Results  Component Value Date   LDLCALC 32 10/14/2023   HgbA1c:  Lab Results  Component Value Date   HGBA1C 8.7 (H) 10/14/2023   Urine Drug Screen:     Component Value Date/Time   LABOPIA NONE DETECTED 10/14/2023 0150   COCAINSCRNUR NONE DETECTED 10/14/2023 0150   LABBENZ NONE DETECTED 10/14/2023 0150    AMPHETMU NONE DETECTED 10/14/2023 0150   THCU POSITIVE (A) 10/14/2023 0150   LABBARB NONE DETECTED 10/14/2023 0150    Alcohol Level     Component Value Date/Time   [facility] <15 01/06/2024 2255   INR  Lab Results  Component Value Date   INR 0.9 Jan 06, 2024   APTT  Lab Results  Component Value Date   APTT 23 (L) 01-06-24   CT Head without contrast(Personally reviewed): No acute findings   MRI Brain(Personally reviewed): Acute right corona radiata infarct   ASSESSMENT   Isabella Obrien is a 58 y.o. female with prior lacunar infarcts, diabetes, hypertension, hyperlipidemia, CKD status post renal transplant on immune suppressants, presenting for evaluation of sudden onset of slurred speech that was noted around 9:30 AM.  Was evaluated at an outside hospital with imaging not showing acute findings.  Symptoms did not improve and repeat imaging at [facility] showed a right corona radiata acute infarct.  Her slurred speech is likely related to the stroke.  Her lower extremity weakness is likely multifactorial including stroke and AKI. Stroke etiology remains most likely suspicious of small vessel disease.   Impression: Acute ischemic right corona radiata infarct-etiology likely small vessel disease, AKI  RECOMMENDATIONS  Admit to hospitalist Frequent neurochecks Telemetry No need to repeat echocardiogram-last echocardiogram done in 10/14/2023 with LVEF 70 to 75%, normal aortic and tricuspid valves, negative agitated saline bubble study and normal left atrial size. She was on Plavix  when she presented with her last thalamic stroke.  Her antiplatelets were changed to DAPT-Brilinta  plus aspirin  for 4 weeks  followed by aspirin  only.  At this time, I recommend that we put her back on DAPT-but this time may be with aspirin  and Plavix . She had AKI and rhabdomyolysis for which statins were held.  Her CK was 5093 on 12/04/2023.  It is normal today.  Continue to hold statin as the patient  would not like to resume that. Her main needs would be therapy assessments-PT OT and speech therapy. N.p.o. until cleared by bedside swallow evaluation or formal swallow evaluation Allow permissive hypertension-treat only if systolic is greater than 220 on apparent basis Outpatient neurology follow-up in 8 to 12 weeks. Management of AKI per primary team  Plan discussed with the patient and Dr. Trine, EDP  ______________________________________________________________________    Signed, Eligio Lav, MD [facility]   I personally spent a total of 69 minutes in the care of the patient today including preparing to see the patient, getting/reviewing separately obtained history, performing a medically appropriate exam/evaluation, counseling and educating, placing orders, referring and communicating with other health care professionals, documenting clinical information in the EHR, independently interpreting results, communicating results, and coordinating care.

## 2023-12-16 NOTE — TOC Initial Note (Signed)
 Transition of Care [facility]) - Initial/Assessment Note    Patient Details  Name: Isabella Obrien MRN: [id number] Date of Birth: 12/01/1965  Transition of Care [facility]) CM/SW Contact:    Corean JAYSON Canary, RN Phone Number: 12/16/2023, 4:49 PM  Clinical Narrative:                 Chart reviewed met patient at bedside. History of Rhabdo, just 2 weeks ago where she was admitted.  Presents with weakness, found to have  Acute infarct in the right corona radiata and remote infarct in the left corona radiata.  PT and OT assessments revealed recommendation for RW with 5 inch wheels and OP PT OT.  Discussed these recommendations with patient at bedside. She is agreeable to walker, apria called to have it sent to bedside ETTER Ryan Needles) . Placed OP consults after discussing with Dr Jonel. And patient agreeable.   TOC will continue to monitor for further needs.           Patient Goals and CMS Choice            Expected Discharge Plan and Services                                              Prior Living Arrangements/Services                       Activities of Daily Living      Permission Sought/Granted                  Emotional Assessment              Admission diagnosis:  Hypoalbuminemia [E88.09] Acute ischemic stroke (HCC) [I63.9] AKI (acute kidney injury) (HCC) [N17.9] Acute stroke due to ischemia [facility]) [I63.9] Patient Active Problem List   Diagnosis Date Noted   Normocytic anemia 12/16/2023   History of essential hypertension 12/16/2023   Rhabdomyolysis 11/30/2023   Adnexal mass 11/30/2023   AKI (acute kidney injury) (HCC) 11/29/2023   DM2 (diabetes mellitus, type 2) (HCC) 10/14/2023   Acute ischemic stroke (HCC) 10/13/2023   Severe obstructive sleep apnea-hypopnea syndrome 08/10/2023   Tobacco abuse 08/10/2023   OSA on CPAP 08/10/2023   Pain in left hand 02/25/2023   Mallet finger of left hand 02/25/2023   Ischemic stroke of  frontal lobe (HCC) 01/11/2023   Insomnia 02/02/2020   Internal hemorrhoids 12/20/2017   Menopausal hot flushes 09/06/2017   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   Combined hyperlipidemia associated with type 2 diabetes mellitus (HCC) 07/18/2013   Proteinuria 07/18/2013   Sciatica of right side 07/18/2013   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   CKD (chronic kidney disease), stage IV (HCC) 07/18/2013   Hypertension due to kidney transplant 06/10/2011   History of renal transplant 04/19/2011   Long-term use of immunosuppressant medication 04/19/2011   PCP:  Rolinda Millman, MD Pharmacy:   [facility] [phone] [place] - [address] AT [facility] [address] [facility] [address] [place] [address] Phone: [phone] Fax: [phone]  [facility] [address] [place] [address] Phone: [phone] Fax: [phone]     Social Drivers of Health (SDOH) Social History: SDOH Screenings   Food Insecurity: No Food Insecurity (11/30/2023)  Housing:  Low Risk  (11/30/2023)  Transportation Needs: No Transportation Needs (11/30/2023)  Utilities: Not At Risk (11/30/2023)  Depression (PHQ2-9): Low Risk  (11/03/2020)  Social Connections: Moderately Integrated (10/13/2023)  Tobacco Use: Medium Risk (12/16/2023)   SDOH Interventions:     Readmission Risk Interventions     No data to display

## 2023-12-16 NOTE — Evaluation (Signed)
 Occupational Therapy Evaluation Patient Details Name: Isabella Obrien MRN: [id number] DOB: June 13, 1965 Today's Date: 12/16/2023   History of Present Illness   Pt is a 58 y/o F admitted on 12/15/23 after presenting with c/o slurred speech. MRI showed acute infarct in the right corona radiata. PMH: thalamic stroke May 2025, renal cell transplant, DM2, CKD IV, HLD, HTN     Clinical Impressions Pt currently at min guard assist for transfers simulated to the toilet without assistive device.  Min to mod assist for LB selfcare sit to stand secondary to increased pain in her hips with attempts to reach down to her feet or bring them up and cross them overs.  She also exhibits decreased BUE strength with significant deficits in right shoulder flexion and increased shoulder pain.  She lives with her spouse and daughter and was independent and working prior to her first CVA in May.  Family can provide assist 24 hr post discharge.  Feel she will benefit from acute care OT at this time to help regain independence with selfcare tasks and transfers and increase pain free AROM and strength in the RUE.  Recommend outpatient OT to continue working on these deficits post acute stay.       If plan is discharge home, recommend the following:   A little help with bathing/dressing/bathroom;Assistance with cooking/housework;Help with stairs or ramp for entrance;Assist for transportation;A little help with walking and/or transfers     Functional Status Assessment   Patient has had a recent decline in their functional status and demonstrates the ability to make significant improvements in function in a reasonable and predictable amount of time.     Equipment Recommendations   Tub/shower seat (She would benefit but pt prefers to get down in the tub and have her spouse help her up at this time.  She states her walk-in shower is too small for a seat.)      Precautions/Restrictions   Precautions Precautions:  Fall Restrictions Weight Bearing Restrictions Per Provider Order: No     Mobility Bed Mobility Overal bed mobility: Needs Assistance Bed Mobility: Supine to Sit, Sit to Supine     Supine to sit: Contact guard Sit to supine: Min assist   General bed mobility comments: Min assist for bringing LEs back in the bed when laying down.    Transfers Overall transfer level: Needs assistance Equipment used: None Transfers: Sit to/from Stand, Bed to chair/wheelchair/BSC Sit to Stand: Contact guard assist     Step pivot transfers: Contact guard assist            Balance Overall balance assessment: Needs assistance Sitting-balance support: Feet supported Sitting balance-Leahy Scale: Good     Standing balance support: During functional activity, No upper extremity supported Standing balance-Leahy Scale: Fair Standing balance comment: Able to maintain balance without UE support but deficits are noted during dynamic tasks                           ADL either performed or assessed with clinical judgement   ADL Overall ADL's : Needs assistance/impaired Eating/Feeding: Modified independent;Sitting   Grooming: Supervision/safety   Upper Body Bathing: Supervision/ safety;Sitting   Lower Body Bathing: Minimal assistance;Sit to/from stand   Upper Body Dressing : Minimal assistance;Sitting Upper Body Dressing Details (indicate cue type and reason): to donn gown setup like a robe Lower Body Dressing: Moderate assistance;Sit to/from stand   Toilet Transfer: Ambulation;Contact guard assist   Toileting- Architect  and Hygiene: Sit to/from stand;Contact guard assist       Functional mobility during ADLs: Contact guard assist General ADL Comments: Pt reports having rhabdomylosys a couple weeks ago and still having UE weakness secondary to this.  RUE shoulder flexion is where the most impairment is located with limitations in flexion and external rotation when  incorporated with reaching behind head for simulated grooming tasks.  Vitals stable throughout with HR at 77 BM and BP at 109/84.  Occassional veering with mobility, especially to the right.  Decreased ability to reach either foot or cross one LE over the other for dressing tasks.  May benefit from AE instruction depending on progress.     Vision Baseline Vision/History: 0 No visual deficits Ability to See in Adequate Light: 0 Adequate Patient Visual Report: No change from baseline Vision Assessment?: No apparent visual deficits     Perception Perception: Not tested       Praxis Praxis: Not tested       Pertinent Vitals/Pain Pain Assessment Pain Assessment: No/denies pain     Extremity/Trunk Assessment Upper Extremity Assessment Upper Extremity Assessment: RUE deficits/detail;LUE deficits/detail RUE Deficits / Details: AROM shoulder flexion 0-100 degrees with end range pain.  Unable to tolerate AAROM shoulder flexion greater than approximately 110 degrees.  All other joint AROM WFLs.  Shoulder strength 3-/5, all other joints 4/5. RUE Coordination: decreased gross motor LUE Deficits / Details: Shoulder flexion WFLs slight decreased ability to exhibit shoulder flexion with external rotation to reach behind her head.  All other joints AROM WFLS.  Shoulder strength 3+/5 all other joints 4/5 LUE Sensation: WNL LUE Coordination: decreased gross motor   Lower Extremity Assessment Lower Extremity Assessment: Defer to PT evaluation   Cervical / Trunk Assessment Cervical / Trunk Assessment: Normal   Communication Communication Communication: Impaired Factors Affecting Communication: Difficulty expressing self;Reduced clarity of speech   Cognition Arousal: Alert Behavior During Therapy: WFL for tasks assessed/performed                                 Following commands: Intact                  Home Living Family/patient expects to be discharged to:: Private  residence Living Arrangements: Spouse/significant other Available Help at Discharge: Family Type of Home: House Home Access: Stairs to enter [employer] of Steps: 5 at front Entrance Stairs-Rails: Right Home Layout: One level     Bathroom Shower/Tub: Tub/shower unit;Walk-in shower     Bathroom Accessibility: No   Home Equipment: Hand held shower head          Prior Functioning/Environment Prior Level of Function : Independent/Modified Independent;Working/employed;Driving             Mobility Comments: 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 Pt reports she has experienced some weakness since admission ~2 weeks ago for rhabdomyolysis, ambulating very carefully around the home but denies falls. ADLs Comments: Pt reports since admission ~2 weeks ago 2/2 rhabdomyolysis, she has required assistance getting out of tub after taking a bath. Cooks, has a Social research officer, government.    OT Problem List: Decreased strength;Decreased knowledge of use of DME or AE;Decreased coordination;Decreased range of motion;Impaired balance (sitting and/or standing);Pain;Impaired UE functional use   OT Treatment/Interventions: Patient/family education;Balance training;Neuromuscular education;Therapeutic activities;DME and/or AE instruction;Therapeutic exercise;Self-care/ADL training      OT Goals(Current goals can be found in the care plan  section)   Acute Rehab OT Goals Patient Stated Goal: Pt wants to get back to her normal OT Goal Formulation: With patient/family Time For Goal Achievement: 12/30/23 Potential to Achieve Goals: Good   OT Frequency:  Min 2X/week       AM-PAC OT 6 Clicks Daily Activity     Outcome Measure Help from another person eating meals?: None Help from another person taking care of personal grooming?: A Little Help from another person toileting, which includes using toliet, bedpan, or urinal?: A Little Help from another person bathing (including  washing, rinsing, drying)?: A Little Help from another person to put on and taking off regular upper body clothing?: A Little Help from another person to put on and taking off regular lower body clothing?: A Lot 6 Click Score: 18   End of Session Equipment Utilized During Treatment: Gait belt Nurse Communication: Mobility status  Activity Tolerance: Patient tolerated treatment well Patient left: in bed;with call bell/phone within reach;with family/visitor present  OT Visit Diagnosis: Unsteadiness on feet (R26.81);Other abnormalities of gait and mobility (R26.89);Muscle weakness (generalized) (M62.81);Pain Pain - Right/Left: Right Pain - part of body: Shoulder                Time: 1002-1040 OT Time Calculation (min): 38 min Charges:  OT General Charges $OT Visit: 1 Visit OT Evaluation $OT Eval Moderate Complexity: 1 Mod OT Treatments $Self Care/Home Management : 23-37 mins  Lynwood Constant, OTR/L Acute Rehabilitation Services  Office [phone] 12/16/2023

## 2023-12-16 NOTE — Evaluation (Signed)
 Clinical/Bedside Swallow Evaluation Patient Details  Name: Isabella Obrien MRN: [id number] Date of Birth: Jan 29, 1966  Today's Date: 12/16/2023 Time: SLP Start Time (ACUTE ONLY): 0725 SLP Stop Time (ACUTE ONLY): 0754 SLP Time Calculation (min) (ACUTE ONLY): 29 min  Past Medical History:  Past Medical History:  Diagnosis Date   Chronic kidney disease    has kidney transplant - having aphoresis now every other week   Diabetes mellitus without complication (HCC)    dm type 2 - fasting 150   History of blood product transfusion    Hyperlipidemia    Hypertension    Menopausal hot flushes 09/06/2017   Controlled on Lexapro  by Dr. Marget   Neuromuscular disorder Isabella Obrien)    Past Surgical History:  Past Surgical History:  Procedure Laterality Date   COLONOSCOPY     diateck catheter placement  09/2013   KIDNEY TRANSPLANT     ORIF ANKLE FRACTURE Left 2014   TEE WITHOUT CARDIOVERSION N/A 01/13/2023   Procedure: TRANSESOPHAGEAL ECHOCARDIOGRAM;  Surgeon: Santo Stanly LABOR, MD;  Location: [facility];  Service: Cardiovascular;  Laterality: N/A;   HPI:  Isabella Obrien is a 58 y.o. female with medical history significant for acute thalamic stroke in May 2025, renal cell transplant on chronic immunosuppressive/steroid therapy, type 2 diabetes mellitus [facility], CKD stage IV baseline creatinine 1.8-2.5, who is admitted to [facility] on 12/15/2023 with acute ischemic CVA suspected dx on the basis of acute onset of dysarthria, dysphagia, with LKW appearing to be approximately 2200 on 12/14/2023, with MRI brain performed today showing evidence of acute infarction of the right corona radiata.  Preceding CT head showed evidence of acute intracranial process.  Given timing of last known well, she is outside of the window for consideration for the PA and is also outside of the window for consideration for thrombectomy.    Assessment / Plan / Recommendation  Clinical Impression    Pt reported increased dysphagia with onset of stroke symptoms characterized by increased effort with eating and drinking to avoid getting choked. Pt denied concerns for oral pocketing or significant aspiration events thus far. We discussed proceeding with a MBSS today given her hx of dysphagia following prior strokes and acute concerns for dysphagia with this recent stroke. Pt in agreement and MBSS is set up for later this morning.   Clinically, pt presents with a grossly functional oropharyngeal swallow. Oral prep and transit prompt with complete oral clearance. Pharyngeal swallow initiation appeared timely with laryngeal elevation noted. No overt or subtle s/s of aspiration noted across consistencies. Pt does appear to be concentrating on swallowing.   Recommend a regular diet and thin liquids as tolerated. PO meds as tolerated. Plan to objectively assess oropharyngeal swallow function with MBSS later this morning.   SLP Visit Diagnosis: Dysphagia, unspecified (R13.10)    Aspiration Risk  Mild aspiration risk    Diet Recommendation Regular;Thin liquid    Liquid Administration via: Cup;Straw Medication Administration: Whole meds with liquid Supervision: Patient able to self feed Compensations: Slow rate;Small sips/bites Postural Changes: Seated upright at 90 degrees    Other  Recommendations Oral Care Recommendations: Oral care BID     Assistance Recommended at Discharge  TBD; defer to PT/OT recommendations  Functional Status Assessment Patient has had a recent decline in their functional status and demonstrates the ability to make significant improvements in function in a reasonable and predictable amount of time.  Frequency and Duration min 1 x/week  2 weeks  Prognosis Prognosis for improved oropharyngeal function: Good      Swallow Study   General Date of Onset: 12/16/23 HPI: Isabella Obrien is a 58 y.o. female with medical history significant for acute thalamic stroke  in May 2025, renal cell transplant on chronic immunosuppressive/steroid therapy, type 2 diabetes mellitus [facility], CKD stage IV baseline creatinine 1.8-2.5, who is admitted to [facility] on 12/15/2023 with acute ischemic CVA suspected dx on the basis of acute onset of dysarthria, dysphagia, with LKW appearing to be approximately 2200 on 12/14/2023, with MRI brain performed today showing evidence of acute infarction of the right corona radiata.  Preceding CT head showed evidence of acute intracranial process.  Given timing of last known well, she is outside of the window for consideration for the PA and is also outside of the window for consideration for thrombectomy. Type of Study: Bedside Swallow Evaluation Previous Swallow Assessment: OP clinical swallow assessment with reg/thins recommended; no hx of instrumental swallow assessment Diet Prior to this Study: NPO Temperature Spikes Noted: No Respiratory Status: Room air History of Recent Intubation: No Behavior/Cognition: Alert;Cooperative;Pleasant mood Oral Cavity Assessment: Within Functional Limits Oral Cavity - Dentition: Adequate natural dentition Vision: Functional for self-feeding Self-Feeding Abilities: Able to feed self Patient Positioning: Upright in bed Baseline Vocal Quality: Normal (clear; though hyponasal) Volitional Swallow: Able to elicit    Oral/Motor/Sensory Function Overall Oral Motor/Sensory Function: Mild impairment Facial ROM: Within Functional Limits Facial Symmetry: Within Functional Limits Lingual ROM: Suspected CN XII (hypoglossal) dysfunction (generalized reduced ROM) Lingual Symmetry: Within Functional Limits Lingual Strength: Reduced;Suspected CN XII (hypoglossal) dysfunction Velum: Suspected CN X (Vagus) dysfunction (hyponasal speech) Mandible: Impaired   Ice Chips Ice chips: Not tested   Thin Liquid Thin Liquid: Within functional limits Presentation: Cup;Self Fed;Straw Other Comments: pt  taking smaller sips    Nectar Thick Nectar Thick Liquid: Not tested   Honey Thick Honey Thick Liquid: Not tested   Puree Puree: Within functional limits Presentation: Self Fed   Solid     Solid: Within functional limits Presentation: Self Fed      Isabella Obrien 12/16/2023,8:08 AM

## 2023-12-16 NOTE — ED Notes (Addendum)
Pt taken to [facility]

## 2023-12-16 NOTE — Progress Notes (Addendum)
 STROKE TEAM PROGRESS NOTE   SUBJECTIVE (INTERVAL HISTORY) Isabella Obrien is at the bedside.  Overall her condition is stable.  She still have slurred speech and dysphagia, but moving all extremities.  She had stroke in 12/2022, 09/2023, rhabdomyolysis 2 weeks ago off statin.  This time found to have new stroke at right CR.  But also found to have severe anemia.   OBJECTIVE Temp:  [98.2 F (36.8 C)-98.5 F (36.9 C)] 98.4 F (36.9 C) (07/25 1227) Pulse Rate:  [64-89] 86 (07/25 1257) Cardiac Rhythm: Other (Comment) (07/25 1257) Resp:  [16-27] 27 (07/25 1257) BP: (113-132)/(66-92) 120/89 (07/25 1257) SpO2:  [92 %-98 %] 92 % (07/25 1257) Weight:  [63.5 kg] 63.5 kg (07/24 2248)  Recent Labs  Lab 12/15/23 2243 12/16/23 1224 12/16/23 1831  GLUCAP 191* 167* 175*   Recent Labs  Lab 12/15/23 2255 12/15/23 2300 12/16/23 0546  NA 137 137 139  K 4.8 4.8 4.5  CL 104 103 105  CO2 23  --  23  GLUCOSE 209* 208* 82  BUN 41* 39* 41*  CREATININE 2.83* 2.90* 2.61*  CALCIUM  8.4*  --  8.5*  MG  --   --  1.8   Recent Labs  Lab 12/15/23 2255 12/16/23 0546  AST 20 16  ALT 41 34  ALKPHOS 108 100  BILITOT 0.8 0.8  PROT 5.2* 4.8*  ALBUMIN 2.7* 2.6*   Recent Labs  Lab 12/15/23 2255 12/15/23 2300 12/16/23 0546 12/16/23 1717  WBC 10.1  --  8.8  --   NEUTROABS 7.9*  --  6.4  --   HGB 7.5* 8.5* 7.3* 7.8*  HCT 25.0* 25.0* 24.2* 25.4*  MCV 98.8  --  98.0  --   PLT 316  --  299  --    Recent Labs  Lab 12/15/23 2255  CKTOTAL 224   Recent Labs    12/15/23 2255 12/16/23 0657  LABPROT 13.1 12.7  INR 0.9 0.9   No results for input(s): COLORURINE, LABSPEC, PHURINE, GLUCOSEU, HGBUR, BILIRUBINUR, KETONESUR, PROTEINUR, UROBILINOGEN, NITRITE, LEUKOCYTESUR in the last 72 hours.  Invalid input(s): APPERANCEUR     Component Value Date/Time   CHOL 193 12/16/2023 0546   TRIG 259 (H) 12/16/2023 0546   HDL 33 (L) 12/16/2023 0546   CHOLHDL 5.8 12/16/2023 0546   VLDL 52  (H) 12/16/2023 0546   LDLCALC 108 (H) 12/16/2023 0546   LDLCALC 57 01/25/2020 1137   Lab Results  Component Value Date   HGBA1C 8.7 (H) 10/14/2023      Component Value Date/Time   LABOPIA NONE DETECTED 10/14/2023 0150   COCAINSCRNUR NONE DETECTED 10/14/2023 0150   LABBENZ NONE DETECTED 10/14/2023 0150   AMPHETMU NONE DETECTED 10/14/2023 0150   THCU POSITIVE (A) 10/14/2023 0150   LABBARB NONE DETECTED 10/14/2023 0150    Recent Labs  Lab 12/15/23 2255  ETH <15    I have personally reviewed the radiological images below and agree with the radiology interpretations.  DG Swallowing Func-Speech Pathology Result Date: 12/16/2023 Table formatting from the original result was not included. Modified Barium Swallow Study Patient Details Name: Isabella Obrien MRN: [id number] Date of Birth: 30-Sep-1965 Today's Date: 12/16/2023 HPI/PMH: HPI: Isabella Obrien is a 58 y.o. female with medical history significant for acute thalamic stroke in May 2025, renal cell transplant on chronic immunosuppressive/steroid therapy, type 2 diabetes mellitus [facility], CKD stage IV baseline creatinine 1.8-2.5, who is admitted to [facility] on 12/15/2023 with acute ischemic CVA suspected  dx on the basis of acute onset of dysarthria, dysphagia, with LKW appearing to be approximately 2200 on 12/14/2023, with MRI brain performed today showing evidence of acute infarction of the right corona radiata.  Preceding CT head showed evidence of acute intracranial process.  Given timing of last known well, she is outside of the window for consideration for the PA and is also outside of the window for consideration for thrombectomy. Clinical Impression: Pt presents with a mild oropharyngeal dysphagia per results of MBSS completed today. No aspiration observed across PO trials. Oral deficits characterized by reduced tongue strength and propulsion resulting in pt tilting head backwards to initiate swallow of thin and  nectar-thick liquids by cup. There was trace-min oral, base of tongue, and vallecular residue following initial swallow, which was reduced with second spontaneous dry swallow. Pt challenged with initiating oral transit of barium tablet despite multiple sips of thin liquids. She eventually expectorated the tablet. Pharyngeal deficits included reduced base of tongue retraction, reduced hyoid excursion, incomplete epiglottic inversion, and reduced laryngeal vestibule closure, and reduced pharyngeal stripping. There was fairly consistent scant, shallow, transient penetration of thin and nectar-thick liquids before/during the swallow. No penetration of solids observed. Concerns for esophageal dysphagia included bolus rentention of pudding and solid trials in the mid-upper esophagus. Residue did mostly clear with liquid wash. There was evidence of a CP bar just below the UES, however, did not appear to impact bolus flow. Recommend continue current diet as tolerated. SLP reviewed lingual exercises that may help with lingual strength and propulsion if completed consistently. SLP will continue to follow pt for diet tolerance management and oral exercises during pt's acute hospitalization. Factors that may increase risk of adverse event in presence of aspiration Isabella Obrien 2021): No data recorded Recommendations/Plan: Swallowing Evaluation Recommendations Swallowing Evaluation Recommendations Recommendations: PO diet PO Diet Recommendation: Regular; Thin liquids (Level 0) Liquid Administration via: Cup; Straw Medication Administration: Whole meds with puree (as tolerated) Supervision: Patient able to self-feed Swallowing strategies  : Slow rate; Small bites/sips Postural changes: Position pt fully upright for meals; Stay upright 30-60 min after meals Oral care recommendations: Oral care BID (2x/day) Recommended consults: Consider GI consultation (if pt experiences significant globus senstion, backflow, or other  discomfort with PO intake) Treatment Plan Treatment Plan Treatment recommendations: Therapy as outlined in treatment plan below Follow-up recommendations: Outpatient SLP Functional status assessment: Patient has had a recent decline in their functional status and demonstrates the ability to make significant improvements in function in a reasonable and predictable amount of time. Treatment frequency: Min 1x/week Treatment duration: 2 weeks Interventions: Compensatory techniques; Diet toleration management by SLP; Patient/family education Recommendations Recommendations for follow up therapy are one component of a multi-disciplinary discharge planning process, led by the attending physician.  Recommendations may be updated based on patient status, additional functional criteria and insurance authorization. Assessment: Orofacial Exam: Orofacial Exam Oral Cavity: Oral Hygiene: WFL Oral Cavity - Dentition: Adequate natural dentition Orofacial Anatomy: WFL Oral Motor/Sensory Function: -- (see clinical swallow assessment) Anatomy: No data recorded Boluses Administered: Boluses Administered Boluses Administered: Thin liquids (Level 0); Mildly thick liquids (Level 2, nectar thick); Puree  Oral Impairment Domain: Oral Impairment Domain Lip Closure: No labial escape Tongue control during bolus hold: Cohesive bolus between tongue to palatal seal Bolus preparation/mastication: Timely and efficient chewing and mashing Bolus transport/lingual motion: Slow tongue motion Oral residue: Trace residue lining oral structures Location of oral residue : Tongue Initiation of pharyngeal swallow : Valleculae  Pharyngeal Impairment Domain:  Pharyngeal Impairment Domain Soft palate elevation: No bolus between soft palate (SP)/pharyngeal wall (PW) Laryngeal elevation: Complete superior movement of thyroid cartilage with complete approximation of arytenoids to epiglottic petiole Anterior hyoid excursion: Partial anterior movement Epiglottic  movement: Partial inversion Laryngeal vestibule closure: Incomplete, narrow column air/contrast in laryngeal vestibule Pharyngeal stripping wave : Present - complete Pharyngeal contraction (A/P view only): N/A Pharyngoesophageal segment opening: Complete distension and complete duration, no obstruction of flow Tongue base retraction: Narrow column of contrast or air between tongue base and PPW Pharyngeal residue: Trace residue within or on pharyngeal structures Location of pharyngeal residue: Tongue base; Valleculae  Esophageal Impairment Domain: Esophageal Impairment Domain Esophageal clearance upright position: Esophageal retention Pill: Pill Consistency administered: -- (attempted, though pt had to orally expectorate) Penetration/Aspiration Scale Score: Penetration/Aspiration Scale Score 1.  Material does not enter airway: Puree; Solid 2.  Material enters airway, remains ABOVE vocal cords then ejected out: Mildly thick liquids (Level 2, nectar thick); Thin liquids (Level 0) Compensatory Strategies: Compensatory Strategies Compensatory strategies: No   General Information: No data recorded Diet Prior to this Study: NPO   No data recorded  No data recorded  No data recorded  No data recorded Behavior/Cognition: Alert; Cooperative; Pleasant mood No data recorded No data recorded No data recorded Volitional Swallow: Able to elicit Exam Limitations: No limitations Goal Planning: Prognosis for improved oropharyngeal function: Good No data recorded No data recorded Patient/Family Stated Goal: further assess speech and swallowing needs Consulted and agree with results and recommendations: Patient; Physician; Nurse Pain: Pain Assessment Pain Assessment: No/denies pain End of Session: Start Time:SLP Start Time (ACUTE ONLY): 0820 Stop Time: SLP Stop Time (ACUTE ONLY): 0847 Time Calculation:SLP Time Calculation (min) (ACUTE ONLY): 27 min Charges: SLP Evaluations $ SLP Speech Visit: 1 Visit SLP Evaluations $BSS Swallow: 1  Procedure $MBS Swallow: 1 Procedure $ SLP EVAL LANGUAGE/SOUND PRODUCTION: 1 Procedure SLP visit diagnosis: SLP Visit Diagnosis: Dysphagia, oropharyngeal phase (R13.12) Past Medical History: Past Medical History: Diagnosis Date  Chronic kidney disease   has kidney transplant - having aphoresis now every other week  Diabetes mellitus without complication (HCC)   dm type 2 - fasting 150  History of blood product transfusion   Hyperlipidemia   Hypertension   Menopausal hot flushes 09/06/2017  Controlled on Lexapro  by Dr. Marget  Neuromuscular disorder [facility])  Past Surgical History: Past Surgical History: Procedure Laterality Date  COLONOSCOPY    diateck catheter placement  09/2013  KIDNEY TRANSPLANT    ORIF ANKLE FRACTURE Left 2014  TEE WITHOUT CARDIOVERSION N/A 01/13/2023  Procedure: TRANSESOPHAGEAL ECHOCARDIOGRAM;  Surgeon: Santo Stanly LABOR, MD;  Location: [facility] INVASIVE CV LAB;  Service: Cardiovascular;  Laterality: N/A; [name] 12/16/2023, 9:56 AM  MR BRAIN WO CONTRAST Result Date: 12/16/2023 CLINICAL DATA:  Neuro deficit, acute, stroke suspected EXAM: MRI HEAD WITHOUT CONTRAST TECHNIQUE: Multiplanar, multiecho pulse sequences of the brain and surrounding structures were obtained without intravenous contrast. COMPARISON:  CT head 12/15/2023. FINDINGS: Brain: Acute infarct in the right corona radiata. Slight edema without mass effect. Remote infarct in the left corona radiata. No midline shift or acute hemorrhage. No mass lesion or hydrocephalus. Cerebral atrophy. Vascular: Major arterial flow voids are maintained at the skull base. Skull and upper cervical spine: Normal marrow signal. Sinuses/Orbits: Clear sinuses.  No acute orbital findings. Other: None. IMPRESSION: 1. Acute infarct in the right corona radiata. 2. Remote infarct in the left corona radiata. Electronically Signed   By: Gilmore GORMAN Molt M.D.   On: 12/16/2023  02:27   CT HEAD WO CONTRAST Result Date: 12/15/2023 EXAM: CT HEAD WITHOUT  CONTRAST 12/15/2023 11:08:35 PM TECHNIQUE: CT of the head was performed without the administration of intravenous contrast. Automated exposure control, iterative reconstruction, and/or weight based adjustment of the mA/kV was utilized to reduce the radiation dose to as low as reasonably achievable. COMPARISON: 10/13/2023 CLINICAL HISTORY: Slurred speech. FINDINGS: BRAIN AND VENTRICLES: No acute hemorrhage. Gray-white differentiation is preserved. No hydrocephalus. No extra-axial collection. No mass effect or midline shift. Old lacunar infarct in the left corona radiata. Global cortical atrophy. Subcortical and periventricular small vessel ischemic changes. ORBITS: No acute abnormality. SINUSES: No acute abnormality. SOFT TISSUES AND SKULL: No acute soft tissue abnormality. No skull fracture. IMPRESSION: 1. No acute intracranial abnormality. 2. Old lacunar infarct in the left corona radiata. 3. Atrophy with small vessel ischemic changes. Electronically signed by: Pinkie Pebbles MD 12/15/2023 11:11 PM EDT RP Workstation: [id number]   MR PELVIS WO CONTRAST Result Date: 12/01/2023 CLINICAL DATA:  Adnexal mass suspected. EXAM: MRI PELVIS WITHOUT CONTRAST TECHNIQUE: Multiplanar multisequence MR imaging of the pelvis was performed. No intravenous contrast was administered. COMPARISON:  CT and ultrasound exam 11/29/2023. FINDINGS: Urinary Tract: The urinary bladder appears normal for the degree of distention. Bowel:  Suspicion for soft tissue thickening in the low rectum. Vascular/Lymphatic: No abdominal aortic aneurysm. No abdominal lymphadenopathy Reproductive: Uterine fibroids evident. Including dominant exophytic right fundal fibroid measuring 4.4 x 4.6 x 5.4 cm. Junctional zone preserved. Hypointense stroma of the cervix is preserved within both the is cysts evident. Right ovary measures 1.9 x 1.3 x 2.1 cm without mass lesion. Left ovary measures 1.9 x 1.2 x 1.8 cm without mass lesion. Other: Edema is noted in the  root of the central small bowel mesentery, new since yesterday's CT. Expected flow void within the associated dominant central mesenteric vasculature. Transplant kidney identified right pelvis. Musculoskeletal: Presacral edema evident. 12 mm presacral nodule identified on 27/4. 60 mm presacral lesion identified on 19/4. There is diffuse high signal intensity in the gluteus medius muscles bilaterally as well as the abductor muscles, iliacus muscles, psoas muscles and paraspinal muscles bilaterally. Possible tiny focal fluid collection in the right gluteus medius muscle on image 17/5. IMPRESSION: 1. No evidence for adnexal mass. 2. Uterine fibroids, including dominant exophytic right fundal fibroid measuring 4.4 x 4.6 x 5.4 cm. This lesion accounts for the findings on recent CT and pelvic ultrasound. 3. Suspicion for irregular wall thickening in the low rectum. Neoplasm not excluded. Close clinical correlation recommended with review of colorectal cancer screening history. 4. Presacral edema with 2 presacral nodules, the larger measuring 60 mm. These are nonspecific on this noncontrast study. Metastatic disease not excluded. Close follow-up warranted. 5. Diffuse high signal intensity in the numerous muscles of the anatomic pelvis, paraspinal region and upper thighs. 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 6. Edema in the root of the central small bowel mesentery, not visible on yesterday's CT. Nonspecific finding but warranting follow-up. Electronically Signed   By: Camellia Candle M.D.   On: 12/01/2023 08:29   US  Renal Transplant w/Doppler Result Date: 11/30/2023 CLINICAL DATA:  Acute kidney injury. EXAM: ULTRASOUND OF RENAL TRANSPLANT WITH RENAL DOPPLER ULTRASOUND TECHNIQUE: Ultrasound examination of the renal transplant was performed with gray-scale, color and duplex doppler evaluation. COMPARISON:  CT abdomen pelvis 11/29/2023. Ultrasound kidney 01/18/2023 FINDINGS: Transplant  kidney location: Right lower quadrant Transplant Kidney: Renal measurements: 11.6 x 5.3 x 5.1 cm = volume: . Normal in size and  parenchymal echogenicity. No evidence of mass or hydronephrosis. No peri-transplant fluid collection seen. Color flow in the main renal artery:  Yes Color flow in the main renal vein:  Yes Duplex Doppler Evaluation: Main Renal Artery Velocity: 51 cm/sec Main Renal Artery Resistive Index: 0.8 Venous waveform in main renal vein:  Present Intrarenal resistive index in upper pole:  0.76 (normal 0.6-0.8; equivocal 0.8-0.9; abnormal >= 0.9) Intrarenal resistive index in lower pole: 0.77 (normal 0.6-0.8; equivocal 0.8-0.9; abnormal >= 0.9) Bladder: Normal for degree of bladder distention. Other findings:  None. IMPRESSION: Normal ultrasound appearance of right lower quadrant transplant kidney. Electronically Signed   By: Elsie Gravely M.D.   On: 11/30/2023 00:31   US  PELVIC COMPLETE WITH TRANSVAGINAL Result Date: 11/30/2023 CLINICAL DATA:  Adnexal mass. EXAM: TRANSABDOMINAL AND TRANSVAGINAL ULTRASOUND OF PELVIS TECHNIQUE: Both transabdominal and transvaginal ultrasound examinations of the pelvis were performed. Transabdominal technique was performed for global imaging of the pelvis including uterus, ovaries, adnexal regions, and pelvic cul-de-sac. It was necessary to proceed with endovaginal exam following the transabdominal exam to visualize the ovaries and endometrium. COMPARISON:  CT abdomen and pelvis 11/29/2023 FINDINGS: Uterus Measurements: 5.1 x 2.6 x 2.7 cm = volume: 18 mL. No fibroids or other mass visualized. Endometrium Thickness: 3.7 mm.  Small amount of fluid in the endocervical canal. Right ovary The right ovary is not specifically demonstrated. In the right adnexum, there is a heterogeneous solid structure measuring 5.1 x 4.1 x 4.3 cm. This corresponds to the lesion seen on CT. Visualization is somewhat limited by bowel. This could represent an enlarged right ovary/ovarian  mass or it could be an exophytic fibroid. Left ovary Measurements: 2.6 x 1.8 x 3.3 cm = volume: 8 mL. Normal appearance/no adnexal mass. Other findings No abnormal free fluid. IMPRESSION: 1. Visualization of the right adnexa is limited due to bowel. The right ovary is not specifically visualized. There is a 5.1 cm solid mass lesion in the right adnexum corresponding to CT abnormality. This could represent a right ovarian mass or an exophytic fibroid. Consider elective follow-up MRI. 2. Normal ultrasound appearance of the uterus and left ovary. Electronically Signed   By: Elsie Gravely M.D.   On: 11/30/2023 00:28   CT Renal Stone Study Result Date: 11/29/2023 CLINICAL DATA:  Flank pain weakness EXAM: CT ABDOMEN AND PELVIS WITHOUT CONTRAST TECHNIQUE: Multidetector CT imaging of the abdomen and pelvis was performed following the standard protocol without IV contrast. RADIATION DOSE REDUCTION: This exam was performed according to the departmental dose-optimization program which includes automated exposure control, adjustment of the mA and/or kV according to patient size and/or use of iterative reconstruction technique. COMPARISON:  Ultrasound 01/18/2023 FINDINGS: Lower chest: Lung bases demonstrate no acute airspace disease. Hepatobiliary: No focal liver abnormality is seen. No gallstones, gallbladder wall thickening, or biliary dilatation. Pancreas: Unremarkable. No pancreatic ductal dilatation or surrounding inflammatory changes. Spleen: Normal in size without focal abnormality. Adrenals/Urinary Tract: Adrenal glands are normal. Atrophic native kidneys. Right iliac fossa transplant kidney without hydronephrosis or focal abnormality without contrast. Bladder is unremarkable Stomach/Bowel: Stomach nonenlarged. No dilated small bowel. No acute bowel wall thickening. Negative appendix. Vascular/Lymphatic: Aortic atherosclerosis. No aneurysm. No suspicious lymph nodes. Reproductive: Lobulated uterus with 4.3 x 4.3 cm  mass on the right side. Other: Negative for pelvic effusion or free air. Partially calcified slightly irregular soft tissue density anterior to the sacrum measuring 16 x 15 mm on series 3, image 58. Small left inferior presacral nodule measuring 10 mm on series  3, image 66. Mild presacral soft tissue thickening, series 3, image 63 Musculoskeletal: No acute osseous abnormality. IMPRESSION: 1. Negative for hydronephrosis or ureteral stone. Atrophic native kidneys. Right iliac fossa transplant kidney without hydronephrosis or focal abnormality in the absence of contrast. 2. Lobulated uterus with 4.3 cm mass on the right side, likely due to fibroid change, with adnexal mass not entirely excluded. Correlation with nonemergent pelvic ultrasound is suggested. 3. Partially calcified slightly irregular soft tissue density anterior to the sacrum measuring 16 x 15 mm. Small left inferior presacral nodule measuring 10 mm. Mild soft tissue density in the presacral region, no adjacent sacral fracture or other inflammatory process in the pelvis. Nodules could be secondary to adenopathy, either infectious/inflammatory versus neoplastic in etiology. Correlate for any known history of malignancy. At a minimum, short interval CT follow-up would be advised. 4. Aortic atherosclerosis. Aortic Atherosclerosis (ICD10-I70.0). Electronically Signed   By: Luke Bun M.D.   On: 11/29/2023 19:39     PHYSICAL EXAM  Temp:  [98.2 F (36.8 C)-98.5 F (36.9 C)] 98.4 F (36.9 C) (07/25 1227) Pulse Rate:  [64-89] 86 (07/25 1257) Resp:  [16-27] 27 (07/25 1257) BP: (113-132)/(66-92) 120/89 (07/25 1257) SpO2:  [92 %-98 %] 92 % (07/25 1257) Weight:  [63.5 kg] 63.5 kg (07/24 2248)  General - Well nourished, well developed, in no apparent distress.  Ophthalmologic - fundi not visualized due to noncooperation.  Cardiovascular - Regular rhythm and rate.  Neuro - awake, alert, eyes open, orientated to age, place, time. No aphasia,  fluent language, following all simple commands, but mild to moderate dysarthria. Able to name and repeat in dysarthric voice. No gaze palsy, tracking bilaterally, visual field full. No significant send facial droop. Tongue midline. Bilateral UEs 5/5, no drift. Bilaterally LEs 5/5, no drift. Sensation symmetrical bilaterally, b/l FTN intact, gait not tested.    ASSESSMENT/PLAN Ms. ANICIA LEUTHOLD is a 58 y.o. female with history of diabetes, hypertension, hyperlipidemia, smoker, severe OSA on CPAP, CKD status post kidney transplant on immunosuppressant, strokes, rhabdomyolysis admitted for slurring speech and dysphagia. No TNK given due to outside window.    Stroke:  right CR infarct likely secondary to small vessel disease source Patient went to the hospital at [facility] [place] MRI was done and there was concern for possible stroke and she was discharged home.  At [facility] home her symptoms getting worse, she presented to [facility]. MRI acute infarct in the right CR.  Remote infarct in the left CR CTA head and neck in 09/2023 unremarkable with only mild scattered atherosclerosis 2D Echo EF 70 to 75% in 09/2023 LDL 108 HgbA1c 8.7 in 09/2023 UDS pending SCD's for VTE prophylaxis aspirin  325 mg daily prior to admission, now on aspirin  81 mg daily given severe anemia. Ongoing aggressive stroke risk factor management Therapy recommendations: Outpatient PT OT speech Disposition: Pending  History of stroke 12/2022 admitted for left CR infarct, likely small vessel disease.  MRA unremarkable.  Carotid Doppler unremarkable.  EF 55 to 60%.  TCD bubble study no PFO.  TEE no PFO, severe LVH and papillary muscle hypertrophy.  LDL 29, A1c 7.5, UDS positive for THC.  Discharged on DAPT and Crestor  40.  Recommend 30-day CardioNet monitoring as outpatient.  Smoking cessation education provided 02/2023 30-day CardioNet monitoring no A-fib 07/2023 diagnosed with severe OSA, put on  CPAP 09/2023 admitted for right thalamic infarct.  CT head and neck unremarkable.  EF 70 to 75%.  LDL 32, A1c 8.7.  Discharged on aspirin  325 and Brilinta  90 twice daily for 4 weeks followed by aspirin  325 daily.  Continue Crestor  40.  History of rhabdomyolysis Admitted earlier this month for AKI, found to have severe rhabdomyolysis, concerning for statin related Statin discontinued since.  Severe anemia ?  Hemorrhoids Hemoglobin drop since 6 months from 12.7 down to today at 7.3 Patient reported lower GI bleeding with possible hemorrhoids GI curbside consulted Monitoring Avoid DAPT at this time, recommend aspirin  81 only Management per primary team  Diabetes HgbA1c 8.7 in 09/2023 goal < 7.0 Controlled CBG monitoring SSI DM education and close PCP follow up  Hypertension Home meds amlodipine Stable Long term BP goal normotensive  Hyperlipidemia Home meds: None LDL 108, goal < 70 Will avoid statin given likely statin related rhabdomyolysis Now on Zetia Continue Zetia at discharge  Tobacco abuse Current smoker Smoking cessation counseling provided Pt is willing to quit  Other Stroke Risk Factors Substance abuse, UDS pending previous UDS showing positive for THC Obstructive sleep apnea, on CPAP at home  Other Active Problems 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 Please call with questions. Pt will follow up with Dr. Rosemarie at 01/26/24 at [facility]. Thanks for the consult.   Ary Cummins, MD PhD Stroke Neurology 12/16/2023 7:09 PM  I discussed with Isabella Obrien. I spent additional 30 minutes inpatient face-to-face time with the patient and family, reviewing test results, images and medication, and discussing the diagnosis, treatment plan and potential prognosis. This patient's care requiresreview of multiple databases, neurological assessment, discussion with family, other specialists and medical decision making of  high complexity.      To contact Stroke Continuity provider, please refer to [email or web address]. After hours, contact General Neurology

## 2023-12-16 NOTE — Evaluation (Signed)
 Speech/Language Evaluation Patient Details  Name: Isabella Obrien MRN: [id number] Date of Birth: 09/06/1965  Today's Date: 12/16/2023 Time: SLP Start Time (ACUTE ONLY): 0725 SLP Stop Time (ACUTE ONLY): 0754 SLP Time Calculation (min) (ACUTE ONLY): 29 min  Past Medical History:  Past Medical History:  Diagnosis Date   Chronic kidney disease    has kidney transplant - having aphoresis now every other week   Diabetes mellitus without complication (HCC)    dm type 2 - fasting 150   History of blood product transfusion    Hyperlipidemia    Hypertension    Menopausal hot flushes 09/06/2017   Controlled on Lexapro  by Dr. Marget   Neuromuscular disorder [facility])    Past Surgical History:  Past Surgical History:  Procedure Laterality Date   COLONOSCOPY     diateck catheter placement  09/2013   KIDNEY TRANSPLANT     ORIF ANKLE FRACTURE Left 2014   TEE WITHOUT CARDIOVERSION N/A 01/13/2023   Procedure: TRANSESOPHAGEAL ECHOCARDIOGRAM;  Surgeon: Santo Stanly LABOR, MD;  Location: [facility] INVASIVE CV LAB;  Service: Cardiovascular;  Laterality: N/A;   HPI:  Isabella Obrien is a 58 y.o. 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  Preceding CT head showed evidence of acute intracranial process.  Given timing of last known well, she is outside of the window for consideration for the PA and is also outside of the window for consideration for thrombectomy.    Assessment / Plan / Recommendation  Clinical Impression   Pt  presents with mild dysarthria characterized by imprecise articulation impacting speech intelligibility (~90%) at the conversation level and hyponasal speech. Pt reported hx of dysarthria, though speech deficits had resolved from one of her previous strokes. She reported acute onset of speech deficits yesterday morning. Oral mech exam significant for reduced lingual strength L>R during laterization and generalized reduced lingual ROM. Observed reduced mandible ROM, though pt reported this is secondary to rhabdomyolysis. Expressive and receptive language functions observed to be WNL. SLP reviewed lingual exercises in effort to help improve articulation. She would benefit from outpatient SLP follow up post d/c from hospital.   Plan: SLP will continue to follow to address speech and swallow goals during acute hospitalization.  SLP Visit Diagnosis: Dysphagia, unspecified (R13.10); Dysarthria (R47.1)    Aspiration Risk  Mild aspiration risk    Diet Recommendation Regular;Thin liquid    Liquid Administration via: Cup;Straw Medication Administration: Whole meds with liquid Supervision: Patient able to self feed Compensations: Slow rate;Small sips/bites Postural Changes: Seated upright at 90 degrees    Other  Recommendations Oral Care Recommendations: Oral care BID     Assistance Recommended at Discharge  Defer to PT/OT recommendations  Functional Status Assessment Patient has had a recent decline in their functional status and demonstrates the ability to make significant improvements in function in a reasonable and predictable amount of time.  Frequency and Duration min 1 x/week  2 weeks       Prognosis Prognosis for improved oropharyngeal function: Good      Swallow Study   General  Date of Onset: 12/16/23 HPI: Isabella Obrien is a 58 y.o. 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  Preceding CT head showed evidence of acute intracranial process.  Given timing of last known well, she is outside of the window for consideration for the PA and is also outside of the window for consideration for thrombectomy. Type of Study: Bedside Swallow Evaluation Previous Swallow Assessment: OP clinical swallow assessment with reg/thins recommended; no hx of instrumental swallow assessment Diet Prior to this Study: NPO Temperature Spikes Noted: No Respiratory Status: Room air History of Recent Intubation: No Behavior/Cognition: Alert;Cooperative;Pleasant mood Oral Cavity Assessment: Within Functional Limits Oral Cavity - Dentition: Adequate natural dentition Vision: Functional for self-feeding Self-Feeding Abilities: Able to feed self Patient Positioning: Upright in bed Baseline Vocal Quality: Normal (clear; though hyponasal) Volitional Swallow: Able to elicit    Oral/Motor/Sensory Function Overall Oral Motor/Sensory Function: Mild impairment Facial ROM: Within Functional Limits Facial Symmetry: Within Functional Limits Lingual ROM: Suspected CN XII (hypoglossal) dysfunction Lingual Symmetry: Within Functional Limits Lingual Strength: Reduced;Suspected CN XII (hypoglossal) dysfunction Velum: Suspected CN X (Vagus) dysfunction Mandible: Impaired                      [name] 12/16/2023,8:16 AM

## 2023-12-16 NOTE — Hospital Course (Addendum)
 58 y.o. F with HTN, DM, ESRD s/p LDKT 2006 on Tacro/Cellcept , CKD IV baseline Cr 2, OSA, and recurrent stroke who presented with slurred speech and dysphagia.  [place] speech, went to the ER, but MRI showed no definitive stroke and she was discharged home.  Symptoms persisted so she presented to the hospital here, MRI confirmed right CR stroke.  Also noted to have Hgb down to 7.3 from 13 in May when DAPT was started.

## 2023-12-16 NOTE — Plan of Care (Signed)
  Problem: SLP Dysphagia Goals Goal: Patient will utilize recommended strategies Description: Patient will utilize recommended strategies during swallow to increase swallowing safety with Flowsheets (Taken 12/16/2023 0955) Patient will utilize recommended strategies during swallow to increase swallowing safety with: independently Goal: Misc Dysphagia Goal Flowsheets (Taken 12/16/2023 0955) Misc Dysphagia Goal: Pt will complete lingual strengthening exercises with min verbal cues.   Problem: SLP Language Goals Goal: Patient will utilize speech intelligibility Description: Patient will utilize speech intelligibility strategies to  enhance communication with Flowsheets (Taken 12/16/2023 0955) Patient will utilize speech intelligibility strategies to enhance communication with ____: modified independence

## 2023-12-16 NOTE — Evaluation (Signed)
 Physical Therapy Evaluation Patient Details Name: Isabella Obrien MRN: [id number] DOB: 1965/10/29 Today's Date: 12/16/2023  History of Present Illness  Pt is a 58 y/o F admitted on 12/15/23 after presenting with c/o slurred speech. MRI showed acute infarct in the right corona radiata. PMH: thalamic stroke May 2025, renal cell transplant, DM2, CKD IV, HLD, HTN  Clinical Impression  Pt seen for PT evaluation with pt agreeable. 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 Pt denies numbness & tingling but reports experiencing weakness since admission ~2 weeks ago & being diagnosed with rhabdomyolysis following a beach trip. Pt reports since then she has required assistance getting out of the tub after taking a bath, has to be cautious with gait. On this date, pt requires min assist for bed mobility, CGA for gait without AD (see below for details). Pt would benefit from high level balance training & strength training to facilitate return to independent, working PLOF. Will continue to follow pt acutely & recommend OPPT at d/c.      If plan is discharge home, recommend the following: A little help with bathing/dressing/bathroom;A little help with walking and/or transfers;Assistance with cooking/housework;Assist for transportation;Help with stairs or ramp for entrance   Can travel by private vehicle        Equipment Recommendations Rolling walker (2 wheels)  Recommendations for Other Services       Functional Status Assessment Patient has had a recent decline in their functional status and demonstrates the ability to make significant improvements in function in a reasonable and predictable amount of time.     Precautions / Restrictions Precautions Precautions: Fall Restrictions Weight Bearing Restrictions Per Provider Order: No      Mobility  Bed Mobility Overal bed mobility:  Needs Assistance Bed Mobility: Supine to Sit     Supine to sit: HOB elevated, Used rails, Min assist (from ED stretcher, exit L side of bed)          Transfers Overall transfer level: Needs assistance Equipment used: None Transfers: Sit to/from Stand Sit to Stand: Supervision, Contact guard assist                Ambulation/Gait Ambulation/Gait assistance: Contact guard assist Gait Distance (Feet): 120 Feet Assistive device: None Gait Pattern/deviations: Decreased step length - left, Decreased step length - right, Step-through pattern, Decreased stride length Gait velocity: decreased     General Gait Details: Pt intermittently reaching for rail on L but not necessary, no overt LOB, some slight drifting to L at times.  Stairs            Wheelchair Mobility     Tilt Bed    Modified Rankin (Stroke Patients Only)       Balance Overall balance assessment: Needs assistance Sitting-balance support: Feet supported Sitting balance-Leahy Scale: Good     Standing balance support: During functional activity, No upper extremity supported Standing balance-Leahy Scale: Fair                               Pertinent Vitals/Pain Pain Assessment Pain Assessment: No/denies pain    Home Living Family/patient expects to be discharged to:: Private residence Living Arrangements: Spouse/significant other Available Help at Discharge: Family Type of Home: House Home Access: Stairs to enter Entrance Stairs-Rails: Right Entrance Stairs-Number of Steps: 5 at front  Home Layout: One level Home Equipment: Hand held shower head      Prior Function Prior Level of Function : Independent/Modified Independent;Working/employed;Driving             Mobility Comments: 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 Pt reports she has experienced some weakness since admission ~2 weeks ago for rhabdomyolysis, ambulating very carefully around the  home but denies falls. ADLs Comments: Pt reports since admission ~2 weeks ago 2/2 rhabdomyolysis, she has required assistance getting out of tub after taking a bath. Cooks, has a Social research officer, government.     Extremity/Trunk Assessment   Upper Extremity Assessment Upper Extremity Assessment: Generalized weakness (Pt reports RUE is weaker following admission ~2 weeks ago.)    Lower Extremity Assessment Lower Extremity Assessment: Generalized weakness       Communication   Communication Communication: Impaired Factors Affecting Communication: Difficulty expressing self;Reduced clarity of speech    Cognition Arousal: Alert Behavior During Therapy: WFL for tasks assessed/performed   PT - Cognitive impairments: No apparent impairments                       PT - Cognition Comments: very sweet lady, a little tearful at times 2/2 situation Following commands: Intact       Cueing Cueing Techniques: Verbal cues     General Comments      Exercises     Assessment/Plan    PT Assessment Patient needs continued PT services  PT Problem List Decreased strength;Decreased activity tolerance;Decreased balance;Decreased mobility;Decreased knowledge of use of DME       PT Treatment Interventions DME instruction;Balance training;Neuromuscular re-education;Gait training;Stair training;Functional mobility training;Therapeutic activities;Therapeutic exercise;Patient/family education    PT Goals (Current goals can be found in the Care Plan section)  Acute Rehab PT Goals Patient Stated Goal: get better PT Goal Formulation: With patient Time For Goal Achievement: 12/30/23 Potential to Achieve Goals: Good    Frequency Min 2X/week     Co-evaluation               AM-PAC PT 6 Clicks Mobility  Outcome Measure Help needed turning from your back to your side while in a flat bed without using bedrails?: None Help needed moving from lying on your back to sitting on the side of a flat bed  without using bedrails?: A Little Help needed moving to and from a bed to a chair (including a wheelchair)?: A Little Help needed standing up from a chair using your arms (e.g., wheelchair or bedside chair)?: A Little Help needed to walk in hospital room?: A Little Help needed climbing 3-5 steps with a railing? : A Little 6 Click Score: 19    End of Session   Activity Tolerance: Patient tolerated treatment well Patient left: in chair;with call bell/phone within reach (set up with meal tray) Nurse Communication: Mobility status PT Visit Diagnosis: Unsteadiness on feet (R26.81);Muscle weakness (generalized) (M62.81);Other abnormalities of gait and mobility (R26.89)    Time: 0902-0919 PT Time Calculation (min) (ACUTE ONLY): 17 min   Charges:   PT Evaluation $PT Eval Moderate Complexity: 1 Mod   PT General Charges $$ ACUTE PT VISIT: 1 Visit         Richerd Pinal, PT, DPT 12/16/23, 10:44 AM  Richerd CHRISTELLA Pinal 12/16/2023, 10:42 AM

## 2023-12-16 NOTE — ED Notes (Signed)
 CCMD notified. Pt is on monitor.

## 2023-12-17 ENCOUNTER — Other Ambulatory Visit (HOSPITAL_COMMUNITY): Payer: Self-pay

## 2023-12-17 DIAGNOSIS — E1122 Type 2 diabetes mellitus with diabetic chronic kidney disease: Secondary | ICD-10-CM | POA: Diagnosis not present

## 2023-12-17 DIAGNOSIS — I639 Cerebral infarction, unspecified: Secondary | ICD-10-CM | POA: Diagnosis not present

## 2023-12-17 DIAGNOSIS — Z940 Kidney transplant status: Secondary | ICD-10-CM | POA: Diagnosis not present

## 2023-12-17 DIAGNOSIS — N184 Chronic kidney disease, stage 4 (severe): Secondary | ICD-10-CM | POA: Diagnosis not present

## 2023-12-17 LAB — CBC
HCT: 23.8 % — ABNORMAL LOW (ref 36.0–46.0)
Hemoglobin: 7.4 g/dL — ABNORMAL LOW (ref 12.0–15.0)
MCH: 30.3 pg (ref 26.0–34.0)
MCHC: 31.1 g/dL (ref 30.0–36.0)
MCV: 97.5 fL (ref 80.0–100.0)
Platelets: 308 K/uL (ref 150–400)
RBC: 2.44 MIL/uL — ABNORMAL LOW (ref 3.87–5.11)
RDW: 15.5 % (ref 11.5–15.5)
WBC: 8.1 K/uL (ref 4.0–10.5)
nRBC: 0.2 % (ref 0.0–0.2)

## 2023-12-17 LAB — COMPREHENSIVE METABOLIC PANEL WITH GFR
ALT: 28 U/L (ref 0–44)
AST: 13 U/L — ABNORMAL LOW (ref 15–41)
Albumin: 2.5 g/dL — ABNORMAL LOW (ref 3.5–5.0)
Alkaline Phosphatase: 95 U/L (ref 38–126)
Anion gap: 7 (ref 5–15)
BUN: 39 mg/dL — ABNORMAL HIGH (ref 6–20)
CO2: 24 mmol/L (ref 22–32)
Calcium: 8.6 mg/dL — ABNORMAL LOW (ref 8.9–10.3)
Chloride: 108 mmol/L (ref 98–111)
Creatinine, Ser: 2.59 mg/dL — ABNORMAL HIGH (ref 0.44–1.00)
GFR, Estimated: 21 mL/min — ABNORMAL LOW (ref >60)
Glucose, Bld: 108 mg/dL — ABNORMAL HIGH (ref 70–99)
Potassium: 4.5 mmol/L (ref 3.5–5.1)
Sodium: 139 mmol/L (ref 135–145)
Total Bilirubin: 0.8 mg/dL (ref 0.0–1.2)
Total Protein: 4.6 g/dL — ABNORMAL LOW (ref 6.5–8.1)

## 2023-12-17 LAB — GLUCOSE, CAPILLARY
Glucose-Capillary: 108 mg/dL — ABNORMAL HIGH (ref 70–99)
Glucose-Capillary: 134 mg/dL — ABNORMAL HIGH (ref 70–99)
Glucose-Capillary: 187 mg/dL — ABNORMAL HIGH (ref 70–99)

## 2023-12-17 LAB — RAPID URINE DRUG SCREEN, HOSP PERFORMED
Amphetamines: NOT DETECTED
Barbiturates: NOT DETECTED
Benzodiazepines: NOT DETECTED
Cocaine: NOT DETECTED
Opiates: NOT DETECTED
Tetrahydrocannabinol: POSITIVE — AB

## 2023-12-17 MED ORDER — EZETIMIBE 10 MG PO TABS
10.0000 mg | ORAL_TABLET | Freq: Every day | ORAL | 0 refills | Status: AC
  Filled 2023-12-17: qty 30, 30d supply, fill #0

## 2023-12-17 MED ORDER — HYDROCORTISONE ACETATE 25 MG RE SUPP
25.0000 mg | Freq: Two times a day (BID) | RECTAL | 0 refills | Status: AC
  Filled 2023-12-17: qty 8, 4d supply, fill #0

## 2023-12-17 MED ORDER — ASPIRIN 81 MG PO TBEC
81.0000 mg | DELAYED_RELEASE_TABLET | Freq: Every day | ORAL | 12 refills | Status: AC
  Filled 2023-12-17: qty 30, 30d supply, fill #0

## 2023-12-17 NOTE — Plan of Care (Signed)
  Problem: Education: Goal: Knowledge of secondary prevention will improve (MUST DOCUMENT ALL) Outcome: Progressing Goal: Knowledge of patient specific risk factors will improve (DELETE if not current risk factor) Outcome: Progressing   Problem: Health Behavior/Discharge Planning: Goal: Ability to manage health-related needs will improve Outcome: Progressing   Problem: Health Behavior/Discharge Planning: Goal: Ability to manage health-related needs will improve Outcome: Progressing   Problem: Health Behavior/Discharge Planning: Goal: Ability to manage health-related needs will improve Outcome: Progressing   Problem: Coping: Goal: Level of anxiety will decrease Outcome: Progressing

## 2023-12-17 NOTE — Discharge Summary (Signed)
 PATIENT DETAILS Name: Isabella Obrien Age: 58 y.o. Sex: female Date of Birth: 11/10/1965 MRN: [id number]. Admitting Physician: Eva KATHEE Pore, DO ERE:Yjhozm, Vernell, MD  Admit Date: 12/15/2023 Discharge date: 12/17/2023  Recommendations for Outpatient Follow-up:  Follow up with PCP in 1-2 weeks Please obtain CMP/CBC in one week Please ensure follow-up with outpatient GI, general surgery Please ensure follow-up with neurology-stroke clinic.  Admitted From:  Home  Disposition: Outpatient PT.   Discharge Condition: good  CODE STATUS:   Code Status: Full Code   Diet recommendation:  Diet Order             Diet - low sodium heart healthy           Diet Carb Modified           Diet regular Fluid consistency: Thin  Diet effective now                    Brief Summary: 58 year old with history of  renal transplant on immunosuppressive's, recurrent CVA-presented with dysarthria/dysphagia-found to have acute CVA on further workup.  Unfortunately-has been having bright red blood per rectum attributed to hemorrhoids for the past several weeks.  See below for further details.  Significant studies 5/22>> CTA head/neck: No LVO-no high-grade stenosis. 5/23>> TTE: EF 70-75% 5/23>> A1c: 8.7 7/25>> MRI brain: Acute infarct right corona radiator 7/25>> vitamin B12: 185 7/25>> LDL: 108  Consults Neurology Phone consult with GI by Dr. Jonel 7/26.  Brief Hospital Course: Acute right corona radiata infarct Thought to be due to small vessel disease Had recent extensive stroke workup-hence not repeated Due to severity of anemia-neurology recommending to continue on aspirin . Unfortunately intolerant to statin due to history of rhabdomyolysis. She has mild/moderate dysarthria but moving all 4 extremities-ambulating in the hallway/room independently-stable for discharge with continued outpatient follow-up with neurology.  Outpatient physical therapy will be arranged by case  manager prior to discharge.  Bright red blood per rectum with chronic blood loss anemia Longstanding issue-has history of hemorrhoids Claims that she and her PCP are in the process of getting a referral to general surgery. Prior MD-Dr. Jonel discussed with GI-recommendations were for Anusol  suppository-this was started yesterday-she has not had any further hematochezia since yesterday. Hb is essentially unchanged since initial presentation-I saw her ambulating in the hallway with the mobility tech earlier this morning-and she did not seem to have any shortness of breath or any overt symptoms from this anemia. Plan is to discharge home today-continue Anusol  for several more days-she will follow-up with outpatient primary care practitioner and get referral to general surgery.  History of renal transplant CKD 4 Creatinine close to baseline Continue renal suppressive Follow-up with primary nephrologist-Dr. Gearline.  HTN BP slowly creeping up Will resume amlodipine Continue to hold atenolol  Follow-up with PCP-and optimize-resume atenolol  if blood pressure permits  DM-2 Resume usual insulin  regimen/oral hypoglycemic agent Follow with PCP for further optimization  COPD No flare Continue usual bronchodilator regimen  Mood disorder Stable Resume bupropion .  Multiple thyroid nodules Seen incidentally on recent CTA head/neck in May Stable for outpatient workup by PCP if not done already.  Discharge Diagnoses:  Principal Problem:   Acute ischemic stroke [facility]) Active Problems:   History of renal transplant   CKD (chronic kidney disease), stage IV (HCC)   DM2 (diabetes mellitus, type 2) (HCC)   Normocytic anemia   History of essential hypertension   Discharge Instructions:  Activity:  As tolerated with Full fall precautions use walker/cane &  assistance as needed  Discharge Instructions     Ambulatory referral to Physical Therapy   Complete by: As directed    PT and OT  OP   evaluate and treat 1. Acute infarct in the right corona radiata. 2. Remote infarct in the left corona radiata.   Diet - low sodium heart healthy   Complete by: As directed    Diet Carb Modified   Complete by: As directed    Discharge instructions   Complete by: As directed    Follow with Primary MD  Rolinda Millman, MD in 1-2 weeks  Follow-up [place] [facility]-their office will call you with an appointment-if you do not hear from them-please give them a call  Please ask your primary care practitioner to refer you to general surgery for definitive management of hemorrhoids.  Please follow-up with your primary care practitioner early next week for repeat CBC  Please follow-up with your primary nephrologist in the next 1-2 weeks and repeat a chemistry panel.  Get Medicines reviewed and adjusted: Please take all your medications with you for your next visit with your Primary MD  Laboratory/radiological data: Please request your Primary MD to go over all hospital tests and procedure/radiological results at the follow up, please ask your Primary MD to get all Hospital records sent to his/her office.  In some cases, they will be blood work, cultures and biopsy results pending at the time of your discharge. Please request that your primary care M.D. follows up on these results.  Also Note the following: If you experience worsening of your admission symptoms, develop shortness of breath, life threatening emergency, suicidal or homicidal thoughts you must seek medical attention immediately by calling 911 or calling your MD immediately  if symptoms less severe.  You must read complete instructions/literature along with all the possible adverse reactions/side effects for all the Medicines you take and that have been prescribed to you. Take any new Medicines after you have completely understood and accpet all the possible adverse reactions/side effects.   Do not drive when taking Pain  medications or sleeping medications (Benzodaizepines)  Do not take more than prescribed Pain, Sleep and Anxiety Medications. It is not advisable to combine anxiety,sleep and pain medications without talking with your primary care practitioner  Special Instructions: If you have smoked or chewed Tobacco  in the last 2 yrs please stop smoking, stop any regular Alcohol  and or any Recreational drug use.  Wear Seat belts while driving.  Please note: You were cared for by a hospitalist during your hospital stay. Once you are discharged, your primary care physician will handle any further medical issues. Please note that NO REFILLS for any discharge medications will be authorized once you are discharged, as it is imperative that you return to your primary care physician (or establish a relationship with a primary care physician if you do not have one) for your post hospital discharge needs so that they can reassess your need for medications and monitor your lab values.   Increase activity slowly   Complete by: As directed       Allergies as of 12/17/2023       Reactions   Cefuroxime Swelling, Other (See Comments)   Ceftin:hypertension   Benadryl [diphenhydramine Hcl] Swelling, Anxiety, Other (See Comments)   Hyperactivity and angioedema, also   Tape Rash, Other (See Comments)   Paper tape is okay   Wound Dressing Adhesive Rash, Other (See Comments)   Paper tape is ok  Medication List     PAUSE taking these medications    atenolol  100 MG tablet Wait to take this until your doctor or other care provider tells you to start again. Commonly known as: TENORMIN  Take 100 mg by mouth at bedtime.       STOP taking these medications    hydrocortisone  2.5 % rectal cream Commonly known as: ANUSOL -HC       TAKE these medications    acetaminophen  500 MG tablet Commonly known as: TYLENOL  Take 1,000 mg by mouth every 6 (six) hours as needed for headache or mild pain (pain score  1-3).   albuterol  108 (90 Base) MCG/ACT inhaler Commonly known as: VENTOLIN  HFA Inhale 2 puffs into the lungs every 6 (six) hours as needed for wheezing or shortness of breath.   albuterol  (2.5 MG/3ML) 0.083% nebulizer solution Commonly known as: PROVENTIL  Take 3 mLs (2.5 mg total) by nebulization 2 (two) times daily as needed for wheezing or shortness of breath.   amLODipine 5 MG tablet Commonly known as: NORVASC Take 5 mg by mouth daily.   aspirin  EC 81 MG tablet Take 1 tablet (81 mg total) by mouth daily. Swallow whole. Start taking on: December 18, 2023 What changed:  medication strength how much to take additional instructions   buPROPion  150 MG 24 hr tablet Commonly known as: Wellbutrin  XL Take 1 tablet (150 mg total) by mouth daily.   calcitRIOL  0.25 MCG capsule Commonly known as: ROCALTROL  Take 0.25 mcg by mouth daily.   cyanocobalamin  1000 MCG tablet Take 1,000 mcg by mouth daily.   ezetimibe  10 MG tablet Commonly known as: ZETIA  Take 1 tablet (10 mg total) by mouth daily. Start taking on: December 18, 2023   Fish Oil 1000 MG Caps Take 1,000 mg by mouth in the morning and at bedtime.   furosemide  40 MG tablet Commonly known as: LASIX  Take 1 tablet (40 mg total) by mouth daily as needed. What changed: reasons to take this   glipiZIDE 10 MG 24 hr tablet Commonly known as: GLUCOTROL XL Take 10 mg by mouth daily.   hydrocortisone  25 MG suppository Commonly known as: ANUSOL -HC Place 1 suppository (25 mg total) rectally 2 (two) times daily for 4 days.   mycophenolate  500 MG tablet Commonly known as: CELLCEPT  Take 500 mg by mouth in the morning and at bedtime.   predniSONE  5 MG tablet Commonly known as: DELTASONE  Take 5 mg by mouth daily with breakfast.   sodium bicarbonate  650 MG tablet Take 1,300 mg by mouth 3 (three) times daily.   tacrolimus  1 MG capsule Commonly known as: PROGRAF  Take 1 mg by mouth in the morning and at bedtime.   Tresiba FlexTouch  100 UNIT/ML FlexTouch Pen Generic drug: insulin  degludec Inject 13 Units into the skin at bedtime.   Wixela Inhub 100-50 MCG/ACT Aepb Generic drug: fluticasone-salmeterol Inhale 1 puff into the lungs 2 (two) times daily.   zolpidem  10 MG tablet Commonly known as: AMBIEN  Take 10 mg by mouth at bedtime.               Durable Medical Equipment  (From admission, onward)           Start     Ordered   12/16/23 1637  For home use only DME Walker rolling  Once       Question Answer Comment  Walker: With 5 Inch Wheels   Patient needs a walker to treat with the following condition Weakness  12/16/23 1636            Follow-up Information     [facility] Follow up.   Specialty: Rehabilitation Why: They will call you for PT and OT set up Contact information: [address] [place]  [address] [phone]        Rosemarie Eather RAMAN, MD. Go on 01/26/2024.   Specialties: Neurology, Radiology Why: stroke clinic Contact information: [address] [place] [address] [phone]         [facility] Follow up.   Why: medical equipment provider for walker        Rolinda Millman, MD. Schedule an appointment as soon as possible for a visit in 3 day(s).   Specialty: Family Medicine Why: Hospital follow up, Repeat Complete Blood Count Contact information: [address] [place] [address] [phone]         [facility]. Schedule an appointment as soon as possible for a visit in 2 week(s).   Contact information: [address] [place]  [address]               Allergies  Allergen Reactions   Cefuroxime Swelling and Other (See Comments)    Ceftin:hypertension   Benadryl [Diphenhydramine Hcl] Swelling, Anxiety and Other (See Comments)    Hyperactivity and angioedema, also   Tape Rash and Other (See  Comments)    Paper tape is okay   Wound Dressing Adhesive Rash and Other (See Comments)    Paper tape is ok     Other Procedures/Studies: DG Swallowing Func-Speech Pathology Result Date: 12/16/2023 Table formatting from the original result was not included. Modified Barium Swallow Study Patient Details Name: ANGELYN OSTERBERG MRN: [id number] Date of Birth: 1966/01/03 Today's Date: 12/16/2023 HPI/PMH: HPI: Isabella Obrien is a 58 y.o. female with medical history significant for acute thalamic stroke in May 2025, renal cell transplant on chronic immunosuppressive/steroid therapy, type 2 diabetes mellitus [facility], CKD stage IV baseline creatinine 1.8-2.5, who is admitted to [facility] on 12/15/2023 with acute ischemic CVA suspected dx on the basis of acute onset of dysarthria, dysphagia, with LKW appearing to be approximately 2200 on 12/14/2023, with MRI brain performed today showing evidence of acute infarction of the right corona radiata.  Preceding CT head showed evidence of acute intracranial process.  Given timing of last known well, she is outside of the window for consideration for the PA and is also outside of the window for consideration for thrombectomy. Clinical Impression: Pt presents with a mild oropharyngeal dysphagia per results of MBSS completed today. No aspiration observed across PO trials. Oral deficits characterized by reduced tongue strength and propulsion resulting in pt tilting head backwards to initiate swallow of thin and nectar-thick liquids by cup. There was trace-min oral, base of tongue, and vallecular residue following initial swallow, which was reduced with second spontaneous dry swallow. Pt challenged with initiating oral transit of barium tablet despite multiple sips of thin liquids. She eventually expectorated the tablet. Pharyngeal deficits included reduced base of tongue retraction, reduced hyoid excursion, incomplete epiglottic inversion, and reduced  laryngeal vestibule closure, and reduced pharyngeal stripping. There was fairly consistent scant, shallow, transient penetration of thin and nectar-thick liquids before/during the swallow. No penetration of solids observed. Concerns for esophageal dysphagia included bolus rentention of pudding and solid trials in the mid-upper esophagus. Residue did mostly clear with liquid wash. There  was evidence of a CP bar just below the UES, however, did not appear to impact bolus flow. Recommend continue current diet as tolerated. SLP reviewed lingual exercises that may help with lingual strength and propulsion if completed consistently. SLP will continue to follow pt for diet tolerance management and oral exercises during pt's acute hospitalization. Factors that may increase risk of adverse event in presence of aspiration Noe & Lianne 2021): No data recorded Recommendations/Plan: Swallowing Evaluation Recommendations Swallowing Evaluation Recommendations Recommendations: PO diet PO Diet Recommendation: Regular; Thin liquids (Level 0) Liquid Administration via: Cup; Straw Medication Administration: Whole meds with puree (as tolerated) Supervision: Patient able to self-feed Swallowing strategies  : Slow rate; Small bites/sips Postural changes: Position pt fully upright for meals; Stay upright 30-60 min after meals Oral care recommendations: Oral care BID (2x/day) Recommended consults: Consider GI consultation (if pt experiences significant globus senstion, backflow, or other discomfort with PO intake) Treatment Plan Treatment Plan Treatment recommendations: Therapy as outlined in treatment plan below Follow-up recommendations: Outpatient SLP Functional status assessment: Patient has had a recent decline in their functional status and demonstrates the ability to make significant improvements in function in a reasonable and predictable amount of time. Treatment frequency: Min 1x/week Treatment duration: 2 weeks  Interventions: Compensatory techniques; Diet toleration management by SLP; Patient/family education Recommendations Recommendations for follow up therapy are one component of a multi-disciplinary discharge planning process, led by the attending physician.  Recommendations may be updated based on patient status, additional functional criteria and insurance authorization. Assessment: Orofacial Exam: Orofacial Exam Oral Cavity: Oral Hygiene: WFL Oral Cavity - Dentition: Adequate natural dentition Orofacial Anatomy: WFL Oral Motor/Sensory Function: -- (see clinical swallow assessment) Anatomy: No data recorded Boluses Administered: Boluses Administered Boluses Administered: Thin liquids (Level 0); Mildly thick liquids (Level 2, nectar thick); Puree  Oral Impairment Domain: Oral Impairment Domain Lip Closure: No labial escape Tongue control during bolus hold: Cohesive bolus between tongue to palatal seal Bolus preparation/mastication: Timely and efficient chewing and mashing Bolus transport/lingual motion: Slow tongue motion Oral residue: Trace residue lining oral structures Location of oral residue : Tongue Initiation of pharyngeal swallow : Valleculae  Pharyngeal Impairment Domain: Pharyngeal Impairment Domain Soft palate elevation: No bolus between soft palate (SP)/pharyngeal wall (PW) Laryngeal elevation: Complete superior movement of thyroid cartilage with complete approximation of arytenoids to epiglottic petiole Anterior hyoid excursion: Partial anterior movement Epiglottic movement: Partial inversion Laryngeal vestibule closure: Incomplete, narrow column air/contrast in laryngeal vestibule Pharyngeal stripping wave : Present - complete Pharyngeal contraction (A/P view only): N/A Pharyngoesophageal segment opening: Complete distension and complete duration, no obstruction of flow Tongue base retraction: Narrow column of contrast or air between tongue base and PPW Pharyngeal residue: Trace residue within or on  pharyngeal structures Location of pharyngeal residue: Tongue base; Valleculae  Esophageal Impairment Domain: Esophageal Impairment Domain Esophageal clearance upright position: Esophageal retention Pill: Pill Consistency administered: -- (attempted, though pt had to orally expectorate) Penetration/Aspiration Scale Score: Penetration/Aspiration Scale Score 1.  Material does not enter airway: Puree; Solid 2.  Material enters airway, remains ABOVE vocal cords then ejected out: Mildly thick liquids (Level 2, nectar thick); Thin liquids (Level 0) Compensatory Strategies: Compensatory Strategies Compensatory strategies: No   General Information: No data recorded Diet Prior to this Study: NPO   No data recorded  No data recorded  No data recorded  No data recorded Behavior/Cognition: Alert; Cooperative; Pleasant mood No data recorded No data recorded No data recorded Volitional Swallow: Able to elicit Exam Limitations: No limitations  Goal Planning: Prognosis for improved oropharyngeal function: Good No data recorded No data recorded Patient/Family Stated Goal: further assess speech and swallowing needs Consulted and agree with results and recommendations: Patient; Physician; Nurse Pain: Pain Assessment Pain Assessment: No/denies pain End of Session: Start Time:SLP Start Time (ACUTE ONLY): 0820 Stop Time: SLP Stop Time (ACUTE ONLY): 0847 Time Calculation:SLP Time Calculation (min) (ACUTE ONLY): 27 min Charges: SLP Evaluations $ SLP Speech Visit: 1 Visit SLP Evaluations $BSS Swallow: 1 Procedure $MBS Swallow: 1 Procedure $ SLP EVAL LANGUAGE/SOUND PRODUCTION: 1 Procedure SLP visit diagnosis: SLP Visit Diagnosis: Dysphagia, oropharyngeal phase (R13.12) Past Medical History: Past Medical History: Diagnosis Date  Chronic kidney disease   has kidney transplant - having aphoresis now every other week  Diabetes mellitus without complication (HCC)   dm type 2 - fasting 150  History of blood product transfusion   Hyperlipidemia    Hypertension   Menopausal hot flushes 09/06/2017  Controlled on Lexapro  by Dr. Marget  Neuromuscular disorder [facility])  Past Surgical History: Past Surgical History: Procedure Laterality Date  COLONOSCOPY    diateck catheter placement  09/2013  KIDNEY TRANSPLANT    ORIF ANKLE FRACTURE Left 2014  TEE WITHOUT CARDIOVERSION N/A 01/13/2023  Procedure: TRANSESOPHAGEAL ECHOCARDIOGRAM;  Surgeon: Santo Stanly LABOR, MD;  Location: [facility] INVASIVE CV LAB;  Service: Cardiovascular;  Laterality: N/A; [name] 12/16/2023, 9:56 AM  MR BRAIN WO CONTRAST Result Date: 12/16/2023 CLINICAL DATA:  Neuro deficit, acute, stroke suspected EXAM: MRI HEAD WITHOUT CONTRAST TECHNIQUE: Multiplanar, multiecho pulse sequences of the brain and surrounding structures were obtained without intravenous contrast. COMPARISON:  CT head 12/15/2023. FINDINGS: Brain: Acute infarct in the right corona radiata. Slight edema without mass effect. Remote infarct in the left corona radiata. No midline shift or acute hemorrhage. No mass lesion or hydrocephalus. Cerebral atrophy. Vascular: Major arterial flow voids are maintained at the skull base. Skull and upper cervical spine: Normal marrow signal. Sinuses/Orbits: Clear sinuses.  No acute orbital findings. Other: None. IMPRESSION: 1. Acute infarct in the right corona radiata. 2. Remote infarct in the left corona radiata. Electronically Signed   By: Gilmore GORMAN Molt M.D.   On: 12/16/2023 02:27   CT HEAD WO CONTRAST Result Date: 12/15/2023 EXAM: CT HEAD WITHOUT CONTRAST 12/15/2023 11:08:35 PM TECHNIQUE: CT of the head was performed without the administration of intravenous contrast. Automated exposure control, iterative reconstruction, and/or weight based adjustment of the mA/kV was utilized to reduce the radiation dose to as low as reasonably achievable. COMPARISON: 10/13/2023 CLINICAL HISTORY: Slurred speech. FINDINGS: BRAIN AND VENTRICLES: No acute hemorrhage. Gray-white differentiation is preserved.  No hydrocephalus. No extra-axial collection. No mass effect or midline shift. Old lacunar infarct in the left corona radiata. Global cortical atrophy. Subcortical and periventricular small vessel ischemic changes. ORBITS: No acute abnormality. SINUSES: No acute abnormality. SOFT TISSUES AND SKULL: No acute soft tissue abnormality. No skull fracture. IMPRESSION: 1. No acute intracranial abnormality. 2. Old lacunar infarct in the left corona radiata. 3. Atrophy with small vessel ischemic changes. Electronically signed by: Pinkie Pebbles MD 12/15/2023 11:11 PM EDT RP Workstation: [id number]   MR PELVIS WO CONTRAST Result Date: 12/01/2023 CLINICAL DATA:  Adnexal mass suspected. EXAM: MRI PELVIS WITHOUT CONTRAST TECHNIQUE: Multiplanar multisequence MR imaging of the pelvis was performed. No intravenous contrast was administered. COMPARISON:  CT and ultrasound exam 11/29/2023. FINDINGS: Urinary Tract: The urinary bladder appears normal for the degree of distention. Bowel:  Suspicion for soft tissue thickening in the low rectum. Vascular/Lymphatic: No abdominal aortic aneurysm.  No abdominal lymphadenopathy Reproductive: Uterine fibroids evident. Including dominant exophytic right fundal fibroid measuring 4.4 x 4.6 x 5.4 cm. Junctional zone preserved. Hypointense stroma of the cervix is preserved within both the is cysts evident. Right ovary measures 1.9 x 1.3 x 2.1 cm without mass lesion. Left ovary measures 1.9 x 1.2 x 1.8 cm without mass lesion. Other: Edema is noted in the root of the central small bowel mesentery, new since yesterday's CT. Expected flow void within the associated dominant central mesenteric vasculature. Transplant kidney identified right pelvis. Musculoskeletal: Presacral edema evident. 12 mm presacral nodule identified on 27/4. 60 mm presacral lesion identified on 19/4. There is diffuse high signal intensity in the gluteus medius muscles bilaterally as well as the abductor muscles, iliacus  muscles, psoas muscles and paraspinal muscles bilaterally. Possible tiny focal fluid collection in the right gluteus medius muscle on image 17/5. IMPRESSION: 1. No evidence for adnexal mass. 2. Uterine fibroids, including dominant exophytic right fundal fibroid measuring 4.4 x 4.6 x 5.4 cm. This lesion accounts for the findings on recent CT and pelvic ultrasound. 3. Suspicion for irregular wall thickening in the low rectum. Neoplasm not excluded. Close clinical correlation recommended with review of colorectal cancer screening history. 4. Presacral edema with 2 presacral nodules, the larger measuring 60 mm. These are nonspecific on this noncontrast study. Metastatic disease not excluded. Close follow-up warranted. 5. Diffuse high signal intensity in the numerous muscles of the anatomic pelvis, paraspinal region and upper thighs. 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 6. Edema in the root of the central small bowel mesentery, not visible on yesterday's CT. Nonspecific finding but warranting follow-up. Electronically Signed   By: Camellia Candle M.D.   On: 12/01/2023 08:29   US  Renal Transplant w/Doppler Result Date: 11/30/2023 CLINICAL DATA:  Acute kidney injury. EXAM: ULTRASOUND OF RENAL TRANSPLANT WITH RENAL DOPPLER ULTRASOUND TECHNIQUE: Ultrasound examination of the renal transplant was performed with gray-scale, color and duplex doppler evaluation. COMPARISON:  CT abdomen pelvis 11/29/2023. Ultrasound kidney 01/18/2023 FINDINGS: Transplant kidney location: Right lower quadrant Transplant Kidney: Renal measurements: 11.6 x 5.3 x 5.1 cm = volume: . Normal in size and parenchymal echogenicity. No evidence of mass or hydronephrosis. No peri-transplant fluid collection seen. Color flow in the main renal artery:  Yes Color flow in the main renal vein:  Yes Duplex Doppler Evaluation: Main Renal Artery Velocity: 51 cm/sec Main Renal Artery Resistive Index: 0.8 Venous waveform in  main renal vein:  Present Intrarenal resistive index in upper pole:  0.76 (normal 0.6-0.8; equivocal 0.8-0.9; abnormal >= 0.9) Intrarenal resistive index in lower pole: 0.77 (normal 0.6-0.8; equivocal 0.8-0.9; abnormal >= 0.9) Bladder: Normal for degree of bladder distention. Other findings:  None. IMPRESSION: Normal ultrasound appearance of right lower quadrant transplant kidney. Electronically Signed   By: Elsie Gravely M.D.   On: 11/30/2023 00:31   US  PELVIC COMPLETE WITH TRANSVAGINAL Result Date: 11/30/2023 CLINICAL DATA:  Adnexal mass. EXAM: TRANSABDOMINAL AND TRANSVAGINAL ULTRASOUND OF PELVIS TECHNIQUE: Both transabdominal and transvaginal ultrasound examinations of the pelvis were performed. Transabdominal technique was performed for global imaging of the pelvis including uterus, ovaries, adnexal regions, and pelvic cul-de-sac. It was necessary to proceed with endovaginal exam following the transabdominal exam to visualize the ovaries and endometrium. COMPARISON:  CT abdomen and pelvis 11/29/2023 FINDINGS: Uterus Measurements: 5.1 x 2.6 x 2.7 cm = volume: 18 mL. No fibroids or other mass visualized. Endometrium Thickness: 3.7 mm.  Small amount of fluid in the endocervical canal. Right ovary  The right ovary is not specifically demonstrated. In the right adnexum, there is a heterogeneous solid structure measuring 5.1 x 4.1 x 4.3 cm. This corresponds to the lesion seen on CT. Visualization is somewhat limited by bowel. This could represent an enlarged right ovary/ovarian mass or it could be an exophytic fibroid. Left ovary Measurements: 2.6 x 1.8 x 3.3 cm = volume: 8 mL. Normal appearance/no adnexal mass. Other findings No abnormal free fluid. IMPRESSION: 1. Visualization of the right adnexa is limited due to bowel. The right ovary is not specifically visualized. There is a 5.1 cm solid mass lesion in the right adnexum corresponding to CT abnormality. This could represent a right ovarian mass or an  exophytic fibroid. Consider elective follow-up MRI. 2. Normal ultrasound appearance of the uterus and left ovary. Electronically Signed   By: Elsie Gravely M.D.   On: 11/30/2023 00:28   CT Renal Stone Study Result Date: 11/29/2023 CLINICAL DATA:  Flank pain weakness EXAM: CT ABDOMEN AND PELVIS WITHOUT CONTRAST TECHNIQUE: Multidetector CT imaging of the abdomen and pelvis was performed following the standard protocol without IV contrast. RADIATION DOSE REDUCTION: This exam was performed according to the departmental dose-optimization program which includes automated exposure control, adjustment of the mA and/or kV according to patient size and/or use of iterative reconstruction technique. COMPARISON:  Ultrasound 01/18/2023 FINDINGS: Lower chest: Lung bases demonstrate no acute airspace disease. Hepatobiliary: No focal liver abnormality is seen. No gallstones, gallbladder wall thickening, or biliary dilatation. Pancreas: Unremarkable. No pancreatic ductal dilatation or surrounding inflammatory changes. Spleen: Normal in size without focal abnormality. Adrenals/Urinary Tract: Adrenal glands are normal. Atrophic native kidneys. Right iliac fossa transplant kidney without hydronephrosis or focal abnormality without contrast. Bladder is unremarkable Stomach/Bowel: Stomach nonenlarged. No dilated small bowel. No acute bowel wall thickening. Negative appendix. Vascular/Lymphatic: Aortic atherosclerosis. No aneurysm. No suspicious lymph nodes. Reproductive: Lobulated uterus with 4.3 x 4.3 cm mass on the right side. Other: Negative for pelvic effusion or free air. Partially calcified slightly irregular soft tissue density anterior to the sacrum measuring 16 x 15 mm on series 3, image 58. Small left inferior presacral nodule measuring 10 mm on series 3, image 66. Mild presacral soft tissue thickening, series 3, image 63 Musculoskeletal: No acute osseous abnormality. IMPRESSION: 1. Negative for hydronephrosis or ureteral  stone. Atrophic native kidneys. Right iliac fossa transplant kidney without hydronephrosis or focal abnormality in the absence of contrast. 2. Lobulated uterus with 4.3 cm mass on the right side, likely due to fibroid change, with adnexal mass not entirely excluded. Correlation with nonemergent pelvic ultrasound is suggested. 3. Partially calcified slightly irregular soft tissue density anterior to the sacrum measuring 16 x 15 mm. Small left inferior presacral nodule measuring 10 mm. Mild soft tissue density in the presacral region, no adjacent sacral fracture or other inflammatory process in the pelvis. Nodules could be secondary to adenopathy, either infectious/inflammatory versus neoplastic in etiology. Correlate for any known history of malignancy. At a minimum, short interval CT follow-up would be advised. 4. Aortic atherosclerosis. Aortic Atherosclerosis (ICD10-I70.0). Electronically Signed   By: Luke Bun M.D.   On: 11/29/2023 19:39     TODAY-DAY OF DISCHARGE:  Subjective:   Norvel Beth today has no headache,no chest abdominal pain,no new weakness tingling or numbness, feels much better wants to go home today.   Objective:   Blood pressure (!) 126/93, pulse 66, temperature 97.9 F (36.6 C), temperature source Oral, resp. rate 18, height 5' 3 (1.6 m), weight 68.7 kg, last  menstrual period 10/24/2012, SpO2 92%. No intake or output data in the 24 hours ending 12/17/23 1139 Filed Weights   12/15/23 2248 12/17/23 0500  Weight: 63.5 kg 68.7 kg    Exam: Awake Alert, Oriented *3, No new F.N deficits, Normal affect [place].AT,PERRAL Supple Neck,No JVD, No cervical lymphadenopathy appriciated.  Symmetrical Chest wall movement, Good air movement bilaterally, CTAB RRR,No Gallops,Rubs or new Murmurs, No Parasternal Heave +ve B.Sounds, Abd Soft, Non tender, No organomegaly appriciated, No rebound -guarding or rigidity. No Cyanosis, Clubbing or edema, No new Rash or bruise   PERTINENT  RADIOLOGIC STUDIES: DG Swallowing Func-Speech Pathology Result Date: 12/16/2023 Table formatting from the original result was not included. Modified Barium Swallow Study Patient Details Name: ATIA HAUPT MRN: [id number] Date of Birth: [birth year] Today's Date: 12/16/2023 HPI/PMH: HPI: Isabella Obrien is a 58 y.o. 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  Preceding CT head showed evidence of acute intracranial process.  Given timing of last known well, she is outside of the window for consideration for the PA and is also outside of the window for consideration for thrombectomy. Clinical Impression: Pt presents with a mild oropharyngeal dysphagia per results of MBSS completed today. No aspiration observed across PO trials. Oral deficits characterized by reduced tongue strength and propulsion resulting in pt tilting head backwards to initiate swallow of thin and nectar-thick liquids by cup. There was trace-min oral, base of tongue, and vallecular residue following initial swallow, which was reduced with second spontaneous dry swallow. Pt challenged with initiating oral transit of barium tablet despite multiple sips of thin liquids. She eventually expectorated the tablet. Pharyngeal deficits included reduced base of tongue retraction, reduced hyoid excursion, incomplete epiglottic inversion, and reduced laryngeal vestibule closure, and reduced pharyngeal stripping. There was fairly consistent scant, shallow, transient penetration of  thin and nectar-thick liquids before/during the swallow. No penetration of solids observed. Concerns for esophageal dysphagia included bolus rentention of pudding and solid trials in the mid-upper esophagus. Residue did mostly clear with liquid wash. There was evidence of a CP bar just below the UES, however, did not appear to impact bolus flow. Recommend continue current diet as tolerated. SLP reviewed lingual exercises that may help with lingual strength and propulsion if completed consistently. SLP will continue to follow pt for diet tolerance management and oral exercises during pt's acute hospitalization. Factors that may increase risk of adverse event in presence of aspiration Noe & Lianne 2021): No data recorded Recommendations/Plan: Swallowing Evaluation Recommendations Swallowing Evaluation Recommendations Recommendations: PO diet PO Diet Recommendation: Regular; Thin liquids (Level 0) Liquid Administration via: Cup; Straw Medication Administration: Whole meds with puree (as tolerated) Supervision: Patient able to self-feed Swallowing strategies  : Slow rate; Small bites/sips Postural changes: Position pt fully upright for meals; Stay upright 30-60 min after meals Oral care recommendations: Oral care BID (2x/day) Recommended consults: Consider GI consultation (if pt experiences significant globus senstion, backflow, or other discomfort with PO intake) Treatment Plan Treatment Plan Treatment recommendations: Therapy as outlined in treatment plan below Follow-up recommendations: Outpatient SLP Functional status assessment: Patient  has had a recent decline in their functional status and demonstrates the ability to make significant improvements in function in a reasonable and predictable amount of time. Treatment frequency: Min 1x/week Treatment duration: 2 weeks Interventions: Compensatory techniques; Diet toleration management by SLP; Patient/family education Recommendations Recommendations for follow up  therapy are one component of a multi-disciplinary discharge planning process, led by the attending physician.  Recommendations may be updated based on patient status, additional functional criteria and insurance authorization. Assessment: Orofacial Exam: Orofacial Exam Oral Cavity: Oral Hygiene: WFL Oral Cavity - Dentition: Adequate natural dentition Orofacial Anatomy: WFL Oral Motor/Sensory Function: -- (see clinical swallow assessment) Anatomy: No data recorded Boluses Administered: Boluses Administered Boluses Administered: Thin liquids (Level 0); Mildly thick liquids (Level 2, nectar thick); Puree  Oral Impairment Domain: Oral Impairment Domain Lip Closure: No labial escape Tongue control during bolus hold: Cohesive bolus between tongue to palatal seal Bolus preparation/mastication: Timely and efficient chewing and mashing Bolus transport/lingual motion: Slow tongue motion Oral residue: Trace residue lining oral structures Location of oral residue : Tongue Initiation of pharyngeal swallow : Valleculae  Pharyngeal Impairment Domain: Pharyngeal Impairment Domain Soft palate elevation: No bolus between soft palate (SP)/pharyngeal wall (PW) Laryngeal elevation: Complete superior movement of thyroid cartilage with complete approximation of arytenoids to epiglottic petiole Anterior hyoid excursion: Partial anterior movement Epiglottic movement: Partial inversion Laryngeal vestibule closure: Incomplete, narrow column air/contrast in laryngeal vestibule Pharyngeal stripping wave : Present - complete Pharyngeal contraction (A/P view only): N/A Pharyngoesophageal segment opening: Complete distension and complete duration, no obstruction of flow Tongue base retraction: Narrow column of contrast or air between tongue base and PPW Pharyngeal residue: Trace residue within or on pharyngeal structures Location of pharyngeal residue: Tongue base; Valleculae  Esophageal Impairment Domain: Esophageal Impairment Domain Esophageal  clearance upright position: Esophageal retention Pill: Pill Consistency administered: -- (attempted, though pt had to orally expectorate) Penetration/Aspiration Scale Score: Penetration/Aspiration Scale Score 1.  Material does not enter airway: Puree; Solid 2.  Material enters airway, remains ABOVE vocal cords then ejected out: Mildly thick liquids (Level 2, nectar thick); Thin liquids (Level 0) Compensatory Strategies: Compensatory Strategies Compensatory strategies: No   General Information: No data recorded Diet Prior to this Study: NPO   No data recorded  No data recorded  No data recorded  No data recorded Behavior/Cognition: Alert; Cooperative; Pleasant mood No data recorded No data recorded No data recorded Volitional Swallow: Able to elicit Exam Limitations: No limitations Goal Planning: Prognosis for improved oropharyngeal function: Good No data recorded No data recorded Patient/Family Stated Goal: further assess speech and swallowing needs Consulted and agree with results and recommendations: Patient; Physician; Nurse Pain: Pain Assessment Pain Assessment: No/denies pain End of Session: Start Time:SLP Start Time (ACUTE ONLY): 0820 Stop Time: SLP Stop Time (ACUTE ONLY): 0847 Time Calculation:SLP Time Calculation (min) (ACUTE ONLY): 27 min Charges: SLP Evaluations $ SLP Speech Visit: 1 Visit SLP Evaluations $BSS Swallow: 1 Procedure $MBS Swallow: 1 Procedure $ SLP EVAL LANGUAGE/SOUND PRODUCTION: 1 Procedure SLP visit diagnosis: SLP Visit Diagnosis: Dysphagia, oropharyngeal phase (R13.12) Past Medical History: Past Medical History: Diagnosis Date  Chronic kidney disease   has kidney transplant - having aphoresis now every other week  Diabetes mellitus without complication (HCC)   dm type 2 - fasting 150  History of blood product transfusion   Hyperlipidemia   Hypertension   Menopausal hot flushes 09/06/2017  Controlled on Lexapro  by Dr. Marget  Neuromuscular disorder [facility])  Past Surgical History: Past Surgical  History: Procedure  Laterality Date  COLONOSCOPY    diateck catheter placement  09/2013  KIDNEY TRANSPLANT    ORIF ANKLE FRACTURE Left 2014  TEE WITHOUT CARDIOVERSION N/A 01/13/2023  Procedure: TRANSESOPHAGEAL ECHOCARDIOGRAM;  Surgeon: Santo Stanly LABOR, MD;  Location: [facility] INVASIVE CV LAB;  Service: Cardiovascular;  Laterality: N/A; [name] 12/16/2023, 9:56 AM  MR BRAIN WO CONTRAST Result Date: 12/16/2023 CLINICAL DATA:  Neuro deficit, acute, stroke suspected EXAM: MRI HEAD WITHOUT CONTRAST TECHNIQUE: Multiplanar, multiecho pulse sequences of the brain and surrounding structures were obtained without intravenous contrast. COMPARISON:  CT head 12/15/2023. FINDINGS: Brain: Acute infarct in the right corona radiata. Slight edema without mass effect. Remote infarct in the left corona radiata. No midline shift or acute hemorrhage. No mass lesion or hydrocephalus. Cerebral atrophy. Vascular: Major arterial flow voids are maintained at the skull base. Skull and upper cervical spine: Normal marrow signal. Sinuses/Orbits: Clear sinuses.  No acute orbital findings. Other: None. IMPRESSION: 1. Acute infarct in the right corona radiata. 2. Remote infarct in the left corona radiata. Electronically Signed   By: Gilmore GORMAN Molt M.D.   On: 12/16/2023 02:27   CT HEAD WO CONTRAST Result Date: 12/15/2023 EXAM: CT HEAD WITHOUT CONTRAST 12/15/2023 11:08:35 PM TECHNIQUE: CT of the head was performed without the administration of intravenous contrast. Automated exposure control, iterative reconstruction, and/or weight based adjustment of the mA/kV was utilized to reduce the radiation dose to as low as reasonably achievable. COMPARISON: 10/13/2023 CLINICAL HISTORY: Slurred speech. FINDINGS: BRAIN AND VENTRICLES: No acute hemorrhage. Gray-white differentiation is preserved. No hydrocephalus. No extra-axial collection. No mass effect or midline shift. Old lacunar infarct in the left corona radiata. Global cortical atrophy.  Subcortical and periventricular small vessel ischemic changes. ORBITS: No acute abnormality. SINUSES: No acute abnormality. SOFT TISSUES AND SKULL: No acute soft tissue abnormality. No skull fracture. IMPRESSION: 1. No acute intracranial abnormality. 2. Old lacunar infarct in the left corona radiata. 3. Atrophy with small vessel ischemic changes. Electronically signed by: Pinkie Pebbles MD 12/15/2023 11:11 PM EDT RP Workstation: [id number]     PERTINENT LAB RESULTS: CBC: Recent Labs    12/16/23 0546 12/16/23 1717 12/17/23 0441  WBC 8.8  --  8.1  HGB 7.3* 7.8* 7.4*  HCT 24.2* 25.4* 23.8*  PLT 299  --  308   CMET CMP     Component Value Date/Time   NA 139 12/17/2023 0441   NA 137 05/26/2018 0000   K 4.5 12/17/2023 0441   CL 108 12/17/2023 0441   CO2 24 12/17/2023 0441   GLUCOSE 108 (H) 12/17/2023 0441   BUN 39 (H) 12/17/2023 0441   BUN 22 (A) 05/26/2018 0000   CREATININE 2.59 (H) 12/17/2023 0441   CREATININE 1.28 (H) 01/25/2020 1137   CALCIUM  8.6 (L) 12/17/2023 0441   PROT 4.6 (L) 12/17/2023 0441   ALBUMIN 2.5 (L) 12/17/2023 0441   AST 13 (L) 12/17/2023 0441   ALT 28 12/17/2023 0441   ALKPHOS 95 12/17/2023 0441   BILITOT 0.8 12/17/2023 0441   GFR 34.66 (L) 06/24/2021 1556   GFRNONAA 21 (L) 12/17/2023 0441   GFRNONAA 48 (L) 01/25/2020 1137    GFR Estimated Creatinine Clearance: 22.6 mL/min (A) (by C-G formula based on SCr of 2.59 mg/dL (H)). No results for input(s): LIPASE, AMYLASE in the last 72 hours. Recent Labs    12/15/23 2255  CKTOTAL 224   Invalid input(s): POCBNP No results for input(s): DDIMER in the last 72 hours. No results for input(s): HGBA1C in the last  72 hours. Recent Labs    12/16/23 0546  CHOL 193  HDL 33*  LDLCALC 108*  TRIG 259*  CHOLHDL 5.8   No results for input(s): TSH, T4TOTAL, T3FREE, THYROIDAB in the last 72 hours.  Invalid input(s): FREET3 Recent Labs    12/16/23 0546 12/16/23 0657  VITAMINB12  --  185   FOLATE 10.4  --   FERRITIN  --  193  TIBC  --  284  IRON  --  51  RETICCTPCT 5.8*  --    Coags: Recent Labs    12/15/23 2255 12/16/23 0657  INR 0.9 0.9   Microbiology: No results found for this or any previous visit (from the past 240 hours).  FURTHER DISCHARGE INSTRUCTIONS:  Get Medicines reviewed and adjusted: Please take all your medications with you for your next visit with your Primary MD  Laboratory/radiological data: Please request your Primary MD to go over all hospital tests and procedure/radiological results at the follow up, please ask your Primary MD to get all Hospital records sent to his/her office.  In some cases, they will be blood work, cultures and biopsy results pending at the time of your discharge. Please request that your primary care M.D. goes through all the records of your hospital data and follows up on these results.  Also Note the following: If you experience worsening of your admission symptoms, develop shortness of breath, life threatening emergency, suicidal or homicidal thoughts you must seek medical attention immediately by calling 911 or calling your MD immediately  if symptoms less severe.  You must read complete instructions/literature along with all the possible adverse reactions/side effects for all the Medicines you take and that have been prescribed to you. Take any new Medicines after you have completely understood and accpet all the possible adverse reactions/side effects.   Do not drive when taking Pain medications or sleeping medications (Benzodaizepines)  Do not take more than prescribed Pain, Sleep and Anxiety Medications. It is not advisable to combine anxiety,sleep and pain medications without talking with your primary care practitioner  Special Instructions: If you have smoked or chewed Tobacco  in the last 2 yrs please stop smoking, stop any regular Alcohol  and or any Recreational drug use.  Wear Seat belts while  driving.  Please note: You were cared for by a hospitalist during your hospital stay. Once you are discharged, your primary care physician will handle any further medical issues. Please note that NO REFILLS for any discharge medications will be authorized once you are discharged, as it is imperative that you return to your primary care physician (or establish a relationship with a primary care physician if you do not have one) for your post hospital discharge needs so that they can reassess your need for medications and monitor your lab values.  Total Time spent coordinating discharge including counseling, education and face to face time equals greater than 30 minutes.  SignedBETHA Donalda Applebaum 12/17/2023 11:39 AM

## 2023-12-17 NOTE — Care Management (Signed)
 Isabella Obrien has not responded regarding walker, have reached out to [employer] to deliver walker

## 2023-12-17 NOTE — Plan of Care (Signed)
 Problem: Education: Goal: Knowledge of disease or condition will improve Outcome: Adequate for Discharge Goal: Knowledge of secondary prevention will improve (MUST DOCUMENT ALL) Outcome: Adequate for Discharge Goal: Knowledge of patient specific risk factors will improve (DELETE if not current risk factor) Outcome: Adequate for Discharge   Problem: Ischemic Stroke/TIA Tissue Perfusion: Goal: Complications of ischemic stroke/TIA will be minimized Outcome: Adequate for Discharge   Problem: Coping: Goal: Will verbalize positive feelings about self Outcome: Adequate for Discharge Goal: Will identify appropriate support needs Outcome: Adequate for Discharge   Problem: Health Behavior/Discharge Planning: Goal: Ability to manage health-related needs will improve Outcome: Adequate for Discharge Goal: Goals will be collaboratively established with patient/family Outcome: Adequate for Discharge   Problem: Self-Care: Goal: Ability to participate in self-care as condition permits will improve Outcome: Adequate for Discharge Goal: Verbalization of feelings and concerns over difficulty with self-care will improve Outcome: Adequate for Discharge Goal: Ability to communicate needs accurately will improve Outcome: Adequate for Discharge   Problem: Nutrition: Goal: Risk of aspiration will decrease Outcome: Adequate for Discharge Goal: Dietary intake will improve Outcome: Adequate for Discharge   Problem: Education: Goal: Ability to describe self-care measures that may prevent or decrease complications (Diabetes Survival Skills Education) will improve Outcome: Adequate for Discharge Goal: Individualized Educational Video(s) Outcome: Adequate for Discharge   Problem: Coping: Goal: Ability to adjust to condition or change in health will improve Outcome: Adequate for Discharge   Problem: Fluid Volume: Goal: Ability to maintain a balanced intake and output will improve Outcome: Adequate  for Discharge   Problem: Health Behavior/Discharge Planning: Goal: Ability to identify and utilize available resources and services will improve Outcome: Adequate for Discharge Goal: Ability to manage health-related needs will improve Outcome: Adequate for Discharge   Problem: Metabolic: Goal: Ability to maintain appropriate glucose levels will improve Outcome: Adequate for Discharge   Problem: Nutritional: Goal: Maintenance of adequate nutrition will improve Outcome: Adequate for Discharge Goal: Progress toward achieving an optimal weight will improve Outcome: Adequate for Discharge   Problem: Skin Integrity: Goal: Risk for impaired skin integrity will decrease Outcome: Adequate for Discharge   Problem: Tissue Perfusion: Goal: Adequacy of tissue perfusion will improve Outcome: Adequate for Discharge   Problem: Education: Goal: Knowledge of General Education information will improve Description: Including pain rating scale, medication(s)/side effects and non-pharmacologic comfort measures Outcome: Adequate for Discharge   Problem: Health Behavior/Discharge Planning: Goal: Ability to manage health-related needs will improve Outcome: Adequate for Discharge   Problem: Clinical Measurements: Goal: Ability to maintain clinical measurements within normal limits will improve Outcome: Adequate for Discharge Goal: Will remain free from infection Outcome: Adequate for Discharge Goal: Diagnostic test results will improve Outcome: Adequate for Discharge Goal: Respiratory complications will improve Outcome: Adequate for Discharge Goal: Cardiovascular complication will be avoided Outcome: Adequate for Discharge   Problem: Activity: Goal: Risk for activity intolerance will decrease Outcome: Adequate for Discharge   Problem: Nutrition: Goal: Adequate nutrition will be maintained Outcome: Adequate for Discharge   Problem: Coping: Goal: Level of anxiety will decrease Outcome:  Adequate for Discharge   Problem: Elimination: Goal: Will not experience complications related to bowel motility Outcome: Adequate for Discharge Goal: Will not experience complications related to urinary retention Outcome: Adequate for Discharge   Problem: Pain Managment: Goal: General experience of comfort will improve and/or be controlled Outcome: Adequate for Discharge   Problem: Safety: Goal: Ability to remain free from injury will improve Outcome: Adequate for Discharge   Problem: Skin Integrity: Goal: Risk for  impaired skin integrity will decrease Outcome: Adequate for Discharge

## 2024-01-10 ENCOUNTER — Other Ambulatory Visit: Payer: Self-pay | Admitting: Family Medicine

## 2024-01-10 DIAGNOSIS — E041 Nontoxic single thyroid nodule: Secondary | ICD-10-CM

## 2024-01-17 ENCOUNTER — Ambulatory Visit: Attending: Family Medicine | Admitting: Occupational Therapy

## 2024-01-17 ENCOUNTER — Encounter: Payer: Self-pay | Admitting: Physical Therapy

## 2024-01-17 ENCOUNTER — Ambulatory Visit: Admitting: Physical Therapy

## 2024-01-17 DIAGNOSIS — M6281 Muscle weakness (generalized): Secondary | ICD-10-CM

## 2024-01-17 DIAGNOSIS — R262 Difficulty in walking, not elsewhere classified: Secondary | ICD-10-CM

## 2024-01-17 DIAGNOSIS — R29818 Other symptoms and signs involving the nervous system: Secondary | ICD-10-CM | POA: Insufficient documentation

## 2024-01-17 DIAGNOSIS — R2681 Unsteadiness on feet: Secondary | ICD-10-CM | POA: Diagnosis present

## 2024-01-17 DIAGNOSIS — R278 Other lack of coordination: Secondary | ICD-10-CM | POA: Diagnosis present

## 2024-01-17 DIAGNOSIS — R41844 Frontal lobe and executive function deficit: Secondary | ICD-10-CM | POA: Insufficient documentation

## 2024-01-17 NOTE — Patient Instructions (Signed)
 Access Code: [email or web address] URL: [email or web address] Date: 01/17/2024 Prepared by: Clarita Pride  Exercises - Supine Shoulder Flexion AAROM with Dowel  - 1 x daily - 2 sets - 10 reps - Seated Shoulder Abduction AAROM with Dowel  - 1 x daily - 2 sets - 10 reps - Seated Elbow Extension with Self-Anchored Resistance  - 1 x daily - 2 sets - 10 reps - Seated Elbow Flexion with Self-Anchored Resistance  - 1 x daily - 2 sets - 10 reps

## 2024-01-17 NOTE — Therapy (Signed)
 OUTPATIENT OCCUPATIONAL THERAPY NEURO EVALUATION  Patient Name: Isabella Obrien MRN: [id number] DOB:August 10, 1965, 58 y.o., female Today's Date: 01/17/2024  PCP: Rolinda Millman, MD  REFERRING PROVIDER: Jonel Lonni SQUIBB, MD  END OF SESSION:  OT End of Session - 01/17/24 1103     Visit Number 1    Number of Visits 5    Date for OT Re-Evaluation 03/02/24    Authorization Type [employer] 2025 Covered 100% MET    Authorization Time Period VL:MN Auth Not Reqd    OT Start Time 1105    OT Stop Time 1150    OT Time Calculation (min) 45 min    Equipment Utilized During Treatment Testing Material    Activity Tolerance Patient tolerated treatment well    Behavior During Therapy Lability          Past Medical History:  Diagnosis Date   Chronic kidney disease    has kidney transplant - having aphoresis now every other week   Diabetes mellitus without complication (HCC)    dm type 2 - fasting 150   History of blood product transfusion    Hyperlipidemia    Hypertension    Menopausal hot flushes 09/06/2017   Controlled on Lexapro  by Dr. Marget   Neuromuscular disorder [facility])    Past Surgical History:  Procedure Laterality Date   COLONOSCOPY     diateck catheter placement  09/2013   KIDNEY TRANSPLANT     ORIF ANKLE FRACTURE Left 2014   TEE WITHOUT CARDIOVERSION N/A 01/13/2023   Procedure: TRANSESOPHAGEAL ECHOCARDIOGRAM;  Surgeon: Santo Stanly LABOR, MD;  Location: [facility] INVASIVE CV LAB;  Service: Cardiovascular;  Laterality: N/A;   Patient Active Problem List   Diagnosis Date Noted   Normocytic anemia 12/16/2023   History of essential hypertension 12/16/2023   Rhabdomyolysis 11/30/2023   Adnexal mass 11/30/2023   AKI (acute kidney injury) (HCC) 11/29/2023   DM2 (diabetes mellitus, type 2) (HCC) 10/14/2023   Acute ischemic stroke (HCC) 10/13/2023   Severe obstructive sleep apnea-hypopnea syndrome 08/10/2023   Tobacco abuse 08/10/2023   OSA on CPAP 08/10/2023   Pain in left  hand 02/25/2023   Mallet finger of left hand 02/25/2023   Ischemic stroke of frontal lobe (HCC) 01/11/2023   Insomnia 02/02/2020   Internal hemorrhoids 12/20/2017   Menopausal hot flushes 09/06/2017   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   Combined hyperlipidemia associated with type 2 diabetes mellitus (HCC) 07/18/2013   Proteinuria 07/18/2013   Sciatica of right side 07/18/2013   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   CKD (chronic kidney disease), stage IV (HCC) 07/18/2013   Hypertension due to kidney transplant 06/10/2011   History of renal transplant 04/19/2011   Long-term use of immunosuppressant medication 04/19/2011    ONSET DATE: Referral 12/16/23; Hospitalization 7/24-7/26/2025  REFERRING DIAG: I63.9 (ICD-10-CM) - Cerebral infarction, unspecified  THERAPY DIAG:  Muscle weakness (generalized)  Other lack of coordination  Other symptoms and signs involving the nervous system  Frontal lobe and executive function deficit  Rationale for Evaluation and Treatment: Rehabilitation  SUBJECTIVE:   SUBJECTIVE STATEMENT: Pt thought she was getting Speech Therapy evaluation after her PT eval today. Pt has trouble with her thought process, moving and using her hands, and her speech is slower ie) she is having to think about what she is saying more often.  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  Pt reports having rhabdomyolysis a couple weeks prior to the stroke in July 2025 while on vacation at the beach - ie) was climbing the sand dunes, was dehydrated and was told it was the perfect storm to result in her weakness.  She had still been having UE weakness secondary to this.   According to OT in the hospital: She had still been having UE weakness secondary to rhabdomyolysis. RUE shoulder flexion is where the  most impairment is located with limitations in flexion and external rotation when incorporated with reaching behind head for simulated grooming tasks. Decreased ability to reach either foot or cross one LE over the other for dressing tasks. May benefit from AE instruction depending on progress.  Pt accompanied by: self - pt drove herself  PERTINENT HISTORY:   Pt is a 58 y/o F admitted on 12/15/23 after presenting with c/o slurred speech. MRI showed acute infarct in the right corona radiata. PMH: thalamic stroke May 2025, renal cell transplant - 2006, DM2, CKD IV, HLD, HTN  PMH:  Renal cell transplant, DM2, CKD IV, HLD, HTN, COPD, Mood disorder, Multiple thyroid nodules, 3 strokes -- August '24 May '25 July '25 - thalamic stroke May 2025, rhabdomyolysis  PRECAUTIONS: Fall  WEIGHT BEARING RESTRICTIONS: No  PAIN:  Are you having pain? No  FALLS: Has patient fallen in last 6 months? 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 Pt reports she has experienced some weakness since admission ~2 weeks ago for rhabdomyolysis, ambulating very carefully around the home but denies falls. ADLs Comments: Pt reports since admission ~2 weeks ago 2/2 rhabdomyolysis, she has required assistance getting out of tub after taking a bath. Cooks, has a Social research officer, government.  PATIENT GOALS: Pt reported she would like to speed up with  thought process, moving and using her hands, speech is slower and having to think about what I'm saying.  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  HAND DOMINANCE: Right  ADLs:  Overall ADLs: Pt is basically Mod Ind at this time Transfers/ambulation related to ADLs: Ind Eating: Ind Grooming: Ind UB Dressing: Ind LB Dressing: Ind but still slower than usual, wondered if she could stand up  Toileting: Pt reports urgency with bowel - waiting colonoscopy Bathing: standing in shower - but prefers a tub bath Tub Shower transfers: using walk in shower Equipment: Walk in shower, Reacher, and hand held shower (in walk in shower)  IADLs: Drive - [employer] Shopping: [employer], spouse does grocery shopping Light housekeeping: vacuum, swiffer, toilets/sinks, laundry (some) Meal Prep: don't want to (normal), can do it Community mobility: Ind with no AE Medication management: AM/PM medicine box Financial management: Ind Handwriting: No changes  MOBILITY STATUS: Independent  POSTURE COMMENTS:  forward head Sitting balance: WNL  ACTIVITY TOLERANCE: Activity tolerance: Lot more tired -naps 1x/day ~3+ hours  FUNCTIONAL OUTCOME MEASURES: Quick Dash: 13.6   UPPER EXTREMITY ROM:   Lifting arms  Active ROM Right eval Left eval  Shoulder flexion [facility]  Shoulder abduction    Shoulder adduction    Shoulder extension    Shoulder internal rotation    Shoulder external rotation    Elbow flexion [facility] WFL  Elbow extension    Wrist flexion    Wrist extension    Wrist ulnar deviation    Wrist radial deviation    Wrist pronation    Wrist supination    (Blank rows = not tested)  UPPER EXTREMITY MMT:     MMT Right eval Left eval  Shoulder flexion 4 4  Shoulder abduction    Shoulder adduction    Shoulder extension     Shoulder internal rotation    Shoulder external rotation    Middle trapezius    Lower trapezius    Elbow flexion 4 4  Elbow extension    Wrist flexion    Wrist extension    Wrist ulnar deviation    Wrist radial deviation    Wrist pronation    Wrist supination    (Blank rows = not tested)  HAND FUNCTION: Grip strength: Right: 31.9, 29.1, 36.5 lbs; Left: 31.3, 38.8, 35.9 lbs Average: Right - 32.5 lbs; Left - 35.3 lbs  COORDINATION: 9 Hole Peg test: Right: 29.70 sec; Left: 24.63 sec  SENSATION: WFL  EDEMA: NA in UEs but reports some in ankles ie) after [place]  MUSCLE TONE: WFL  COGNITION: Overall cognitive status: Within functional limits for tasks assessed Increased emotional lability r/t sadness mostly  VISION: Subjective report: No changes Baseline vision: Wears glasses for reading only ie) to read pill bottle  Visual history: NA  VISION ASSESSMENT: Not tested  Patient has difficulty with following activities due to following visual impairments: reading pill bottle  OBSERVATIONS: Pt ambulates with no AE and no loss of balance. The pt is well kept and admits to being more emotionally sensitive with several episodes of crying during evaluation.                                                                                                                            TODAY'S TREATMENT:    - Therapeutic exercises completed for duration as noted below including: Simple UE AAROM Exercises for shoulder limitations with dowel and simple elbow/grip strengthening with theraband she was provided by PT for elbow flexion/extension. - Supine Shoulder Flexion AAROM with Dowel  - 1 x daily - 2 sets - 10 reps - Seated Shoulder Abduction AAROM with Dowel  - 1 x daily - 2 sets - 10 reps - Seated Elbow Extension with Self-Anchored Resistance  - 1 x daily - 2 sets - 10 reps - Seated Elbow Flexion with Self-Anchored Resistance  - 1 x daily - 2 sets - 10  reps  PATIENT EDUCATION: Education details: OT role, POC consideration  and initial HEP ideas Person educated: Patient Education method: Explanation, Demonstration, Tactile cues, Verbal cues, and Handouts Education comprehension: verbalized understanding, returned demonstration, verbal cues required, tactile cues required, and needs further education  HOME EXERCISE PROGRAM: 01/17/24: UE ROM HEP initiated Access Code: [email or web address]   GOALS: Goals reviewed with patient? Yes  LONG TERM GOALS: Target date: 02/24/24  Patient will demonstrate updated B UE HEP with visual handouts only for proper execution. Baseline: New to outpt OT and initial HEP ideas provided Goal status: INITIAL  2.  Patient will demonstrate at least 5-10 lbs improvement in grip strength as needed to open jars and other containers, especially in dominant RUE. Baseline: Average: Right - 32.5 lbs; Left - 35.3 lbs Goal status: INITIAL  3.  Patient will demo improved FM coordination as evidenced by completing nine-hole peg with use of dominant RUE in 25 seconds or less. Baseline: Right: 29.70 sec; Left: 24.63 sec Goal status: INITIAL  4.  Patient will independently verbalize at least 3 energy conservation principles in relation to ADLs to increase functional independence and tolerate 45 minute session with multiple tasks with minimal rest breaks. Baseline: Napping daily ~ 3 hours/day compared to working  Goal status: INITIAL  5.  Patient will demonstrate minimal deficits with Quick Dash score indicating improved functional use of affected extremity. Baseline: QuickDash- 13.6% Goal status: INITIAL   ASSESSMENT:  CLINICAL IMPRESSION: Patient is a 57 y.o. female who was seen today for occupational therapy evaluation for UE weakness s/p 3rd stroke and recent rhabdomyolysis . Hx includes Renal cell transplant, DM2, CKD IV, HLD, HTN, COPD, Mood disorder, Multiple thyroid nodules, 3 strokes -- August '24 May '25 July '25 -  thalamic stroke May 2025. Patient currently presents below baseline level of functioning demonstrating functional deficits and impairments as noted below. Pt would benefit from skilled OT services in the outpatient setting to work on impairments as noted below to help pt return to PLOF as able.     PERFORMANCE DEFICITS: in functional skills including ADLs, IADLs, coordination, dexterity, ROM, strength, pain, flexibility, Fine motor control, Gross motor control, mobility, balance, endurance, decreased knowledge of precautions, decreased knowledge of use of DME, and UE functional use, cognitive skills including emotional and energy/drive, and psychosocial skills including coping strategies, environmental adaptation, habits, interpersonal interactions, and routines and behaviors.   IMPAIRMENTS: are limiting patient from ADLs, IADLs, rest and sleep, work, leisure, and social participation.   CO-MORBIDITIES: has co-morbidities such as prior strokes and recent rhabdomyolysis that affects occupational performance. Patient will benefit from skilled OT to address above impairments and improve overall function.  MODIFICATION OR ASSISTANCE TO COMPLETE EVALUATION: Min-Moderate modification of tasks or assist with assess necessary to complete an evaluation.  OT OCCUPATIONAL PROFILE AND HISTORY: Detailed assessment: Review of records and additional review of physical, cognitive, psychosocial history related to current functional performance.  CLINICAL DECISION MAKING: Moderate - several treatment options, min-mod task modification necessary  REHAB POTENTIAL: Good  EVALUATION COMPLEXITY: Moderate    PLAN:  OT FREQUENCY: 1x/week  OT DURATION: 6 weeks  PLANNED INTERVENTIONS: 97535 self care/ADL training, 02889 therapeutic exercise, 97530 therapeutic activity, 97112 neuromuscular re-education, balance training, visual/perceptual remediation/compensation, psychosocial skills training, energy conservation,  coping strategies training, patient/family education, and DME and/or AE instructions  RECOMMENDED OTHER SERVICES: ?Speech therapy  CONSULTED AND AGREED WITH PLAN OF CARE: Patient  PLAN FOR NEXT SESSION:  Progress HEPs - putty, coordination and ROM Cognitive tasks for work simulation UBE - endurance   Clarita LITTIE Pride,  OT 01/17/2024, 9:25 PM

## 2024-01-17 NOTE — Therapy (Unsigned)
 OUTPATIENT PHYSICAL THERAPY NEURO EVALUATION   Patient Name: Isabella Obrien MRN: [id number] DOB:31-Mar-1966, 58 y.o., female Today's Date: 01/18/2024   PCP: Rolinda Millman, MD REFERRING PROVIDER: Jonel Lonni SQUIBB, MD  END OF SESSION:  PT End of Session - 01/18/24 1847     Visit Number 1    Number of Visits 5    Date for PT Re-Evaluation 02/17/24    Authorization Type [employer]    PT Start Time 1017    PT Stop Time 1100    PT Time Calculation (min) 43 min    Activity Tolerance Patient tolerated treatment well    Behavior During Therapy Lability;WFL for tasks assessed/performed          Past Medical History:  Diagnosis Date   Chronic kidney disease    has kidney transplant - having aphoresis now every other week   Diabetes mellitus without complication (HCC)    dm type 2 - fasting 150   History of blood product transfusion    Hyperlipidemia    Hypertension    Menopausal hot flushes 09/06/2017   Controlled on Lexapro  by Dr. Marget   Neuromuscular disorder [facility])    Past Surgical History:  Procedure Laterality Date   COLONOSCOPY     diateck catheter placement  09/2013   KIDNEY TRANSPLANT     ORIF ANKLE FRACTURE Left 2014   TEE WITHOUT CARDIOVERSION N/A 01/13/2023   Procedure: TRANSESOPHAGEAL ECHOCARDIOGRAM;  Surgeon: Santo Stanly LABOR, MD;  Location: [facility] INV[facility];  Service: Cardiovascular;  Laterality: N/A;   Patient Active Problem List   Diagnosis Date Noted   Normocytic anemia 12/16/2023   History of essential hypertension 12/16/2023   Rhabdomyolysis 11/30/2023   Adnexal mass 11/30/2023   AKI (acute kidney injury) (HCC) 11/29/2023   DM2 (diabetes mellitus, type 2) (HCC) 10/14/2023   Acute ischemic stroke (HCC) 10/13/2023   Severe obstructive sleep apnea-hypopnea syndrome 08/10/2023   Tobacco abuse 08/10/2023   OSA on CPAP 08/10/2023   Pain in left hand 02/25/2023   Mallet finger of left hand 02/25/2023   Ischemic stroke of frontal lobe (HCC)  01/11/2023   Insomnia 02/02/2020   Internal hemorrhoids 12/20/2017   Menopausal hot flushes 09/06/2017   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   Combined hyperlipidemia associated with type 2 diabetes mellitus (HCC) 07/18/2013   Proteinuria 07/18/2013   Sciatica of right side 07/18/2013   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   CKD (chronic kidney disease), stage IV (HCC) 07/18/2013   Hypertension due to kidney transplant 06/10/2011   History of renal transplant 04/19/2011   Long-term use of immunosuppressant medication 04/19/2011    ONSET DATE: 12-15-23  REFERRING DIAG: I63.9 (ICD-10-CM) - Acute stroke due to ischemia (HCC)  THERAPY DIAG:  Muscle weakness (generalized)  Unsteadiness on feet  Difficulty in walking, not elsewhere classified  Rationale for Evaluation and Treatment: Rehabilitation  SUBJECTIVE:  SUBJECTIVE STATEMENT: Pt presents to PT eval amb. Independently; reports she drove herself to this appt.  Pt was seen at this facility in May 2025 after ischemic CVA for 1 visit only to address Lt ankle musc. Weakness.  Pt reports she is now having difficulty with her thought process and unable to easily verbalize what she wants to say - now has to think about it.  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 - is taking 3 months off work - unsure if she will return. Pt reports she has gotten much stronger since D/C from hospital (pt hospitalized 7-24 - 12-17-23 at [facility]); pt is scheduled for OT eval to address RUE weakness, partially due to the rhabdomyolysis as well as CVA Pt accompanied by: self  PERTINENT HISTORY: Pt is  a 58 y/o F admitted on 12/15/23 after presenting with c/o slurred speech. MRI showed acute infarct in the right corona radiata. PMH: thalamic stroke May 2025, renal cell transplant - 2006, DM2, CKD IV, HLD, HTN   PMH:  Renal cell transplant, DM2, CKD IV, HLD, HTN, COPD, Mood disorder, Multiple thyroid nodules, 3 strokes -- August '24 May '25 July '25 - thalamic stroke May 2025, h/o rhabdomyolysis approx. 2 weeks prior to CVA  PAIN:  Are you having pain? No  PRECAUTIONS: None  RED FLAGS: None   WEIGHT BEARING RESTRICTIONS: No  FALLS: Has patient fallen in last 6 months? No  LIVING ENVIRONMENT: Lives with: lives with their spouse Lives in: House/apartment Stairs: New porch being built - will be 5 steps; 4 with handrails - not able to reach both  Has following equipment at home: None  PLOF: Independent  PATIENT GOALS: increase strength and endurance; improve balance   OBJECTIVE:  Note: Objective measures were completed at Evaluation unless otherwise noted.  DIAGNOSTIC FINDINGS: IMPRESSION: (CT head without contrast) 1. No acute intracranial abnormality. 2. Old lacunar infarct in the left corona radiata. 3. Atrophy with small vessel ischemic changes.  COGNITION: Overall cognitive status: Within functional limits for tasks assessed   SENSATION: WFL  COORDINATION: WFL's bil. LE's  POSTURE: No Significant postural limitations  LOWER EXTREMITY ROM:     Active  Right Eval Left Eval  Hip flexion 4 4-  Hip extension 3+ 4  Hip abduction 4 4-  Hip adduction    Hip internal rotation    Hip external rotation    Knee flexion 5 4  Knee extension 5 5  Ankle dorsiflexion 5 4  Ankle plantarflexion 5 4+  Ankle inversion    Ankle eversion     (Blank rows = not tested)  LOWER EXTREMITY MMT:    MMT Right Eval Left Eval  Hip flexion 4 4-  Hip extension 3+ 4  Hip abduction 4 4-  Hip adduction    Hip internal rotation    Hip external rotation    Knee flexion 5 4  Knee  extension 5 5  Ankle dorsiflexion 5 4  Ankle plantarflexion 5 4+  Ankle inversion    Ankle eversion    (Blank rows = not tested)  BED MOBILITY:  Not tested  TRANSFERS: Sit to stand: Complete Independence  Assistive device utilized: None     Stand to sit: Complete Independence  Assistive device utilized: None      STAIRS: Findings: Level of Assistance: Modified independence, Stair Negotiation Technique: Alternating Pattern  with Single Rail  on Right, Number of Stairs: 4, Height of Stairs: 6   , and Comments: needs rail for safety to reduce fall risk GAIT: Findings: Gait Characteristics: WFL, Distance walked: 50, Assistive device utilized:None, Level of assistance: Modified independence, and Comments: pt reports she gets easily fatigued - pt wearing sandals (slip on) at eval  FUNCTIONAL TESTS:  30 seconds chair stand test Timed up and go (TUG): 9.66 10 meter walk test: 8.28 secs = 3.96 ft/sec without device  MCTSIB: Condition 1: Avg of 3 trials: 30 sec, Condition 2: Avg of 3 trials: 30 sec, Condition 3: Avg of 3 trials: 30 sec, Condition 4: Avg of 3 trials: 13 sec, and Total Score: 103/120 30 seconds chair stand test - 14x from mat table                                                                                                                               TREATMENT DATE: 01-17-24  HEP issued:   Access Code: [email or web address] URL: [email or web address] Date: 01/18/2024 Prepared by: Rock Kussmaul  Exercises - Feet together - Eyes open and eyes closed  - 1 x daily - 7 x weekly - 1 sets - 1-2 reps - 30 sec hold - HEEL TO TOE   - 1 x daily - 7 x weekly - 1 sets - 1-2 reps - 30 secs hold - Single Leg Stance  - 1 x daily - 7 x weekly - 1 sets - 1-2 reps - 10 hold - Standing Hip Extension with Resistance  - 1 x daily - 7 x weekly - 2-3 sets - 10 reps   PATIENT EDUCATION: Education details: eval results, POC, HEP and recommendation for walking program (approx. 5-8 to start  and gradually increase up to 25-30 as able) Person educated: Patient Education method: Explanation, Demonstration, and Handouts Education comprehension: verbalized understanding and returned demonstration  HOME EXERCISE PROGRAM: See above; yellow and red theraband given for hip extension strengthening exercise  GOALS: Goals reviewed with patient? Yes  SHORT TERM GOALS: same as LTG's as ELOS = 4 weeks   LONG TERM GOALS: Target date: 02-17-24  Pt will report ability to ambulate 20 nonstop for increased endurance/activity tolerance.  Baseline: TBA Goal status: INITIAL  2.  Pt will negotiate 4 steps using step over step sequence without use of handrail to demonstrate improved balance.  Baseline: rail needed Goal status: INITIAL  3.  Pt will increase sit to stand transfers from 14 to 18 in 30 sec chair stand test to demo improved LE strength.  Baseline: 14x Goal status: INITIAL  4.  Pt will stand for 30 secs on condition 4 mCTSIB without LOB to demonstrate increased vestibular input and improved balance.  Baseline: 13 secs Goal status: INITIAL  5.  Increase FGA score by at least 3 points to demo improved balance with gait.  Baseline: TBA Goal status: INITIAL  6.  Independent in HEP for balance, strengthening and endurance (  walking program). Baseline:  Goal status: INITIAL  ASSESSMENT:  CLINICAL IMPRESSION: Patient is a 58 y.o. lady who was seen today for physical therapy evaluation and treatment for LE weakness and balance deficits due to acute ischemic CVA and h/o rhadomyolysis prior to CVA.  Pt presents with high level balance and gait deficits and c/o decreased endurance/activity tolerance.  Pt's TUG score = 9.66 secs (not indicative of fall risk) and gait velocity = 3.96 ft/sec without device (community ambulator category).  Pt's weakest muscle group per MMT is Rt hip extensors (3+/5).  Pt does demonstrate decreased vestibular input in maintaining balance as evidenced by  ability to stand only 13 secs with EC with feet together on foam.  Pt also demonstrates SLS deficits on RLE.  Pt will benefit from PT to address LE weakness, decreased endurance/activity tolerance and decreased high level balance and gait.   OBJECTIVE IMPAIRMENTS: decreased activity tolerance, decreased balance, decreased endurance, difficulty walking, decreased strength, and impaired UE functional use.   ACTIVITY LIMITATIONS: carrying, lifting, bending, squatting, and locomotion level  PARTICIPATION LIMITATIONS: meal prep, cleaning, laundry, shopping, community activity, occupation, and yard work  PERSONAL FACTORS: Past/current experiences and 1-2 comorbidities: h/o multiple CVA's & rhabdomyolysis are also affecting patient's functional outcome.   REHAB POTENTIAL: Excellent  CLINICAL DECISION MAKING: Evolving/moderate complexity  EVALUATION COMPLEXITY: Moderate  PLAN:  PT FREQUENCY: 1x/week  PT DURATION: 4 weeks + eval  PLANNED INTERVENTIONS: 97110-Therapeutic exercises, 97530- Therapeutic activity, [id number]- Neuromuscular re-education, [phone]- Self Care, [id number]- Gait training, and [phone]- Aquatic Therapy  PLAN FOR NEXT SESSION: check HEP for any questions or problems; do FGA; do 6 walk test   Shardai Star Suzanne, PT 01/18/2024, 6:50 PM

## 2024-01-18 ENCOUNTER — Encounter: Payer: Self-pay | Admitting: Physical Therapy

## 2024-01-26 ENCOUNTER — Telehealth: Payer: Self-pay | Admitting: Neurology

## 2024-01-26 ENCOUNTER — Other Ambulatory Visit (HOSPITAL_COMMUNITY): Payer: Self-pay

## 2024-01-26 ENCOUNTER — Encounter: Payer: Self-pay | Admitting: Neurology

## 2024-01-26 ENCOUNTER — Ambulatory Visit: Admitting: Neurology

## 2024-01-26 ENCOUNTER — Telehealth: Payer: Self-pay

## 2024-01-26 VITALS — BP 150/92 | HR 84 | Ht 63.0 in | Wt 146.2 lb

## 2024-01-26 DIAGNOSIS — I6381 Other cerebral infarction due to occlusion or stenosis of small artery: Secondary | ICD-10-CM

## 2024-01-26 DIAGNOSIS — M6282 Rhabdomyolysis: Secondary | ICD-10-CM | POA: Diagnosis not present

## 2024-01-26 DIAGNOSIS — Z789 Other specified health status: Secondary | ICD-10-CM | POA: Diagnosis not present

## 2024-01-26 MED ORDER — REPATHA SURECLICK 140 MG/ML ~~LOC~~ SOAJ: 140.0000 mg | SUBCUTANEOUS | 2 refills | Status: DC

## 2024-01-26 NOTE — Telephone Encounter (Signed)
 Pharmacy Patient Advocate Encounter   Received notification from Physician's Office that prior authorization for Repatha  is required/requested.   Insurance verification completed.   The patient is insured through [facility] .   Per test claim: PA required; PA submitted to above mentioned insurance via Latent Key/confirmation #/EOC [id number] Status is pending

## 2024-01-26 NOTE — Progress Notes (Signed)
 [facility] [address] [place]. [place] [address] [phone]       OFFICE FOLLOW-UP NOTE  [name] Date of Birth:  11/19/1965 Medical Record Number:  [id number]   HPI: Isabella Obrien is a 58 year old Caucasian lady seen today for office follow-up visit following hospital admission for stroke in July 2025.  History is obtained from the patient and review of electronic medical records.  I personally reviewed pertinent available imaging films in PACS.  She has past medical history of diabetes, hypertension, hyperlipidemia, chronic kidney disease status post kidney transplant, tobacco abuse and prior strokes.  She presented most recently on 12/16/2023 the emergency room for evaluation for slurred speech.  She went to hospital at [facility] which is close to her home and states that they had a brain scan done which was raising concern for possible stroke but not definitive and was discharged home.  Her symptoms did not feel better and slurred speech was getting worse along with some dysphagia and hence she presented to [facility] where an MRI scan of the brain was obtained which showed an acute infarct in the right corona radiata and remote age infarction left corona radiata.  She had a prior history of lacunar infarct in the right thalamus in May 2025 as well as another 1 in August 2024.  Her LDL was found to be elevated at 108 and hemoglobin A1c in May 2025 was 8.7.  Patient unfortunately had rhabdomyolysis in July 2025 and has stopped taking his statin and Zetia  since then.  She was also found to have severe anemia and hence was placed only on aspirin  81.  She does have an appointment to see hematologist next week.  She has a colonoscopy scheduled for September 19.  She does have sleep apnea and has seen Dr. Chalice for CPAP. Prior office visit 02/13/2023 Duw[place], NP : [name] is a 58 y.o. female here for hospital follow-up for left frontal infarct.  Returns  today for follow-up.  She feels that she is back to her baseline.  No additional strokelike symptoms.  Reports that her blood pressure was elevated this morning but typically is in normal range.  States that she has not taken her medicine this morning.  She has remained on aspirin  and Plavix .  She is currently wearing a 30-day cardiac monitor.  Has an appointment with heart care on October 1.  She has never had a sleep study.  Reports that if she lays on her back she does feel like her throat is closing up.  Her and her husband sleep in separate rooms.  She has cut back on smoking.  Only smoking half a pack a day.  Previously was smoking 1-1/2 packs a day.  She is wearing nicotine  patches      ROS:   14 system review of systems is positive for speech difficulties, gait imbalance, muscle aches and pains all other systems negative  PMH:  Past Medical History:  Diagnosis Date   Chronic kidney disease    has kidney transplant - having aphoresis now every other week   Diabetes mellitus without complication (HCC)    dm type 2 - fasting 150   History of blood product transfusion    Hyperlipidemia    Hypertension    Menopausal hot flushes 09/06/2017   Controlled on Lexapro  by Dr. Marget   Neuromuscular disorder [facility])    Stroke [facility])     Social History:  Social History  Socioeconomic History   Marital status: Married    Spouse name: Not on file   Number of children: 1   Years of education: Not on file   Highest education level: Not on file  Occupational History   Occupation: Research officer, trade union to hearing impaired    Employer: [employer]  Tobacco Use   Smoking status: Former    Current packs/day: 0.50    Average packs/day: 0.5 packs/day for 1.7 years (0.8 ttl pk-yrs)    Types: Cigarettes    Start date: 12/22/1985    Quit date: 12/23/2015   Smokeless tobacco: Never  Vaping Use   Vaping status: Never Used  Substance and Sexual Activity   Alcohol use: Not Currently     Alcohol/week: 1.0 standard drink of alcohol    Types: 1 Glasses of wine per week    Comment: weekends occasional   Drug use: No   Sexual activity: Yes  Other Topics Concern   Not on file  Social History Narrative   Not on file   Social Drivers of Health   Financial Resource Strain: Not on file  Food Insecurity: No Food Insecurity (12/17/2023)   Hunger Vital Sign    Worried About Running Out of Food in the Last Year: Never true    Ran Out of Food in the Last Year: Never true  Transportation Needs: No Transportation Needs (12/17/2023)   PRAPARE - Administrator, Civil Service (Medical): No    Lack of Transportation (Non-Medical): No  Physical Activity: Not on file  Stress: Not on file  Social Connections: Moderately Integrated (10/13/2023)   Social Connection and Isolation Panel    Frequency of Communication with Friends and Family: More than three times a week    Frequency of Social Gatherings with Friends and Family: More than three times a week    Attends Religious Services: Never    Database administrator or Organizations: Yes    Attends Banker Meetings: Never    Marital Status: Married  Catering manager Violence: Not At Risk (12/17/2023)   Humiliation, Afraid, Rape, and Kick questionnaire    Fear of Current or Ex-Partner: No    Emotionally Abused: No    Physically Abused: No    Sexually Abused: No    Medications:   Current Outpatient Medications on File Prior to Visit  Medication Sig Dispense Refill   acetaminophen  (TYLENOL ) 500 MG tablet Take 1,000 mg by mouth every 6 (six) hours as needed for headache or mild pain (pain score 1-3).     albuterol  (PROVENTIL ) (2.5 MG/3ML) 0.083% nebulizer solution Take 3 mLs (2.5 mg total) by nebulization 2 (two) times daily as needed for wheezing or shortness of breath. 150 mL 1   albuterol  (VENTOLIN  HFA) 108 (90 Base) MCG/ACT inhaler Inhale 2 puffs into the lungs every 6 (six) hours as needed for wheezing or  shortness of breath. 1 each 5   amLODipine (NORVASC) 5 MG tablet Take 5 mg by mouth daily.     aspirin  EC 81 MG tablet Take 1 tablet (81 mg total) by mouth daily. Swallow whole. 30 tablet 12   buPROPion  (WELLBUTRIN  XL) 150 MG 24 hr tablet Take 1 tablet (150 mg total) by mouth daily. 30 tablet 3   calcitRIOL  (ROCALTROL ) 0.25 MCG capsule Take 0.25 mcg by mouth daily.     cyanocobalamin  1000 MCG tablet Take 1,000 mcg by mouth daily.     ezetimibe  (ZETIA ) 10 MG tablet Take 1 tablet (  10 mg total) by mouth daily. 30 tablet 0   fluticasone-salmeterol (WIXELA INHUB) 100-50 MCG/ACT AEPB Inhale 1 puff into the lungs 2 (two) times daily.     furosemide  (LASIX ) 40 MG tablet Take 1 tablet (40 mg total) by mouth daily as needed. (Patient taking differently: Take 40 mg by mouth daily as needed for edema or fluid.) 30 tablet 3   glipiZIDE (GLUCOTROL XL) 10 MG 24 hr tablet Take 10 mg by mouth daily.     mycophenolate  (CELLCEPT ) 500 MG tablet Take 500 mg by mouth in the morning and at bedtime.     Omega-3 Fatty Acids (FISH OIL) 1000 MG CAPS Take 1,000 mg by mouth in the morning and at bedtime.     predniSONE  (DELTASONE ) 5 MG tablet Take 5 mg by mouth daily with breakfast.     sodium bicarbonate  650 MG tablet Take 1,300 mg by mouth 3 (three) times daily.     tacrolimus  (PROGRAF ) 1 MG capsule Take 1 mg by mouth in the morning and at bedtime.     TRESIBA FLEXTOUCH 100 UNIT/ML FlexTouch Pen Inject 13 Units into the skin at bedtime.     zolpidem  (AMBIEN ) 10 MG tablet Take 10 mg by mouth at bedtime.     [Paused] atenolol  (TENORMIN ) 100 MG tablet Take 100 mg by mouth at bedtime.     No current facility-administered medications on file prior to visit.    Allergies:   Allergies  Allergen Reactions   Cefuroxime Swelling and Other (See Comments)    Ceftin:hypertension   Benadryl [Diphenhydramine Hcl] Swelling, Anxiety and Other (See Comments)    Hyperactivity and raises BP   Tape Rash and Other (See Comments)     Paper tape is okay   Wound Dressing Adhesive Rash and Other (See Comments)    Paper tape is ok    Physical Exam General: well developed, well nourished pleasant middle-age Caucasian lady, seated, in no evident distress Head: head normocephalic and atraumatic.  Neck: supple with no carotid or supraclavicular bruits Cardiovascular: regular rate and rhythm, no murmurs Musculoskeletal: no deformity Skin:  no rash/petichiae Vascular:  Normal pulses all extremities Vitals:   01/26/24 0851  BP: (!) 150/92  Pulse: 84   Neurologic Exam Mental Status: Awake and fully alert. Oriented to place and time. Recent and remote memory intact. Attention span, concentration and fund of knowledge appropriate. Mood and affect appropriate.  Cranial Nerves: Fundoscopic exam reveals sharp disc margins. Pupils equal, briskly reactive to light. Extraocular movements full without nystagmus. Visual fields full to confrontation. Hearing intact. Facial sensation intact. Face, tongue, palate moves normally and symmetrically.  Motor: Normal bulk and tone. Normal strength in all tested extremity muscles. Sensory.: intact to touch ,pinprick .position and vibratory sensation.  Coordination: Rapid alternating movements normal in all extremities. Finger-to-nose and heel-to-shin performed accurately bilaterally. Gait and Station: Arises from chair without difficulty. Stance is normal. Gait demonstrates normal stride length and balance . Able to heel, toe and tandem walk with mild difficulty.  Reflexes: 1+ and symmetric. Toes downgoing.   NIHSS  0 Modified Rankin  1   ASSESSMENT: 58 year old Caucasian lady with recurrent lacunar infarcts in August 2024, May 2025 and July 2025 likely from small vessel disease with uncontrolled risk factors.  She unfortunately has statin intolerance and recently had rhabdomyolysis and is being worked up for anemia    PLAN:I had a long discussion with patient regarding her recent recurrent  lacunar stroke statin intolerance and admission for rhabdomyolysis and  answered questions.  I recommend she continue aspirin  81 mg daily alone due to her anemia continue follow-up with hematologist for anemia workup.  Trial of Repatha  injections for lipid management since she has statin intolerance.  Maintain aggressive risk factor modification with strict control of hypertension with blood pressure goal below 130/90, lipids with LDL cholesterol goal below 70 mg percent and diabetes with hemoglobin A1c goal below 6.5%.  She was encouraged to eat a healthy diet with lots of fruits, vegetables, cereals, whole grains regularly.  She was cleared for a scheduled colonoscopy for anemia workup on September 19 hold the aspirin  for a few days prior and resume when after the procedure when safe with a small but acceptable periprocedural risk of TIA/stroke.   I personally spent a total of 40 minutes in the care of the patient today including getting/reviewing separately obtained history, performing a medically appropriate exam/evaluation, counseling and educating, placing orders, referring and communicating with other health care professionals, documenting clinical information in the EHR, independently interpreting results, and coordinating care.        Eather Popp, MD Note: This document was prepared with digital dictation and possible smart phrase technology. Any transcriptional errors that result from this process are unintentional

## 2024-01-26 NOTE — Patient Instructions (Signed)
 I had a long discussion with patient regarding her recent recurrent lacunar stroke statin intolerance and admission for rhabdomyolysis and answered questions.  I recommend she continue aspirin  81 mg daily alone due to her anemia continue follow-up with hematologist for anemia workup.  Trial of Repatha  injections for lipid management since she has statin intolerance.  Maintain aggressive risk factor modification with strict control of hypertension with blood pressure goal below 130/90, lipids with LDL cholesterol goal below 70 mg percent and diabetes with hemoglobin A1c goal below 6.5%.  She was encouraged to eat a healthy diet with lots of fruits, vegetables, cereals, whole grains regularly.  She was cleared for a scheduled colonoscopy for anemia workup on September 19 hold the aspirin  for a few days prior and resume when after the procedure when safe with a small but acceptable periprocedural risk of TIA/stroke.

## 2024-01-26 NOTE — Telephone Encounter (Signed)
 Pt has been informed  by [facility] [phone] that re: her Evolocumab  (REPATHA  SURECLICK) 140 MG/ML SOAJ requires a PA

## 2024-01-27 ENCOUNTER — Other Ambulatory Visit (HOSPITAL_COMMUNITY): Payer: Self-pay

## 2024-01-27 NOTE — Telephone Encounter (Signed)
 Pharmacy Patient Advocate Encounter  Received notification from [facility] that Prior Authorization for Repatha  has been APPROVED from 01/26/2024 to 01/25/2025. Ran test claim, Copay is $0. This test claim was processed through [facility]- copay amounts may vary at other pharmacies due to pharmacy/plan contracts, or as the patient moves through the different stages of their insurance plan.   PA #/Case ID/Reference #: [id number]

## 2024-02-01 ENCOUNTER — Ambulatory Visit
Admission: RE | Admit: 2024-02-01 | Discharge: 2024-02-01 | Disposition: A | Discharge status: Home / Self Care | Source: Ambulatory Visit | Attending: Family Medicine | Admitting: Family Medicine

## 2024-02-01 ENCOUNTER — Other Ambulatory Visit (HOSPITAL_COMMUNITY)
Admission: RE | Admit: 2024-02-01 | Discharge: 2024-02-01 | Disposition: A | Discharge status: Home / Self Care | Source: Ambulatory Visit | Attending: Family Medicine | Admitting: Family Medicine

## 2024-02-01 DIAGNOSIS — E041 Nontoxic single thyroid nodule: Secondary | ICD-10-CM | POA: Insufficient documentation

## 2024-02-02 ENCOUNTER — Ambulatory Visit: Attending: Family Medicine | Admitting: Occupational Therapy

## 2024-02-02 ENCOUNTER — Ambulatory Visit: Admitting: Physical Therapy

## 2024-02-02 DIAGNOSIS — I69352 Hemiplegia and hemiparesis following cerebral infarction affecting left dominant side: Secondary | ICD-10-CM | POA: Insufficient documentation

## 2024-02-02 DIAGNOSIS — R29818 Other symptoms and signs involving the nervous system: Secondary | ICD-10-CM | POA: Diagnosis present

## 2024-02-02 DIAGNOSIS — M6281 Muscle weakness (generalized): Secondary | ICD-10-CM | POA: Insufficient documentation

## 2024-02-02 DIAGNOSIS — R278 Other lack of coordination: Secondary | ICD-10-CM | POA: Insufficient documentation

## 2024-02-02 DIAGNOSIS — R262 Difficulty in walking, not elsewhere classified: Secondary | ICD-10-CM

## 2024-02-02 DIAGNOSIS — R2681 Unsteadiness on feet: Secondary | ICD-10-CM

## 2024-02-02 NOTE — Patient Instructions (Signed)
 Access Code: [email or web address] URL: [email or web address] Date: 02/02/2024 Prepared by: Clarita Pride  Exercises - Putty Squeezes  - 1-2 x daily - 10 reps - Rolling Putty on Table  - 1-2 x daily - 10 reps - Finger Pinch and Pull with Putty  - 1-2 x daily - 10 reps - 3-Point Pinch with Putty  - 1-2 x daily - 10 reps - Tip PUSH with Putty  - 1-2 x daily - 10 reps - Key Pinch with Putty  - 1-2 x daily - 10 reps - Finger Extension with Putty  - 1-2 x daily - 10 reps - Finger Adduction with Putty  - 1-2 x daily - 10 reps - Removing Marbles from Putty  - 1-2 x daily - 10 reps    Memory Compensation Strategies  Use "WARM" strategy. W= write it down A=  associate it R=  repeat it M=  make a mental picture  You can keep a Memory Notebook. Use a 3-ring notebook with sections for the following:  calendar, important names and phone numbers, medications, doctors' names/phone numbers, "to do list"/reminders, and a section to journal what you did each day  Use a calendar to write appointments down.  Write yourself a schedule for the day.  This can be placed on the calendar or in a separate section of the Memory Notebook.  Keeping a regular schedule can help memory.  Use medication organizer with sections for each day or morning/evening pills  You may need help loading it  Keep a basket, or pegboard by the door.   Place items that you need to take out with you in the basket or on the pegboard.  You may also want to include a message board for reminders.  Use sticky notes. Place sticky notes with reminders in a place where the task is performed.  For example:  "turn off the stove" placed by the stove, "lock the door" placed on the door at eye level, "take your medications" on the bathroom mirror or by the place where you normally take your medications  Use alarms, timers, and/or a reminder app. Use while cooking to remind yourself to check on food or as a reminder to take your medicine,  or as a reminder to make a call, or as a reminder to perform another task, etc.  Use a voice recorder app or small tape recorder to record important information and notes for yourself. Go back at the end of the day and listen to these.

## 2024-02-02 NOTE — Therapy (Unsigned)
 OUTPATIENT PHYSICAL THERAPY NEURO TREATMENT NOTE/DISCHARGE SUMMARY   Patient Name: Isabella Obrien MRN: [id number] DOB:11/09/1965, 58 y.o., female Today's Date: 02/03/2024   PCP: Rolinda Millman, MD REFERRING PROVIDER: Jonel Lonni SQUIBB, MD  END OF SESSION:  PT End of Session - 02/03/24 1611     Visit Number 2    Number of Visits 5    Date for PT Re-Evaluation 02/17/24    Authorization Type [employer]    PT Start Time 1451    PT Stop Time 1523    PT Time Calculation (min) 32 min    Activity Tolerance Patient tolerated treatment well    Behavior During Therapy WFL for tasks assessed/performed           Past Medical History:  Diagnosis Date   Chronic kidney disease    has kidney transplant - having aphoresis now every other week   Diabetes mellitus without complication (HCC)    dm type 2 - fasting 150   History of blood product transfusion    Hyperlipidemia    Hypertension    Menopausal hot flushes 09/06/2017   Controlled on Lexapro  by Dr. Marget   Neuromuscular disorder [facility])    Stroke [facility])    Past Surgical History:  Procedure Laterality Date   COLONOSCOPY     diateck catheter placement  09/2013   KIDNEY TRANSPLANT     ORIF ANKLE FRACTURE Left 2014   TEE WITHOUT CARDIOVERSION N/A 01/13/2023   Procedure: TRANSESOPHAGEAL ECHOCARDIOGRAM;  Surgeon: Santo Stanly LABOR, MD;  Location: [facility] INVASIVE CV LAB;  Service: Cardiovascular;  Laterality: N/A;   Patient Active Problem List   Diagnosis Date Noted   Normocytic anemia 12/16/2023   History of essential hypertension 12/16/2023   Rhabdomyolysis 11/30/2023   Adnexal mass 11/30/2023   AKI (acute kidney injury) (HCC) 11/29/2023   DM2 (diabetes mellitus, type 2) (HCC) 10/14/2023   Acute ischemic stroke (HCC) 10/13/2023   Severe obstructive sleep apnea-hypopnea syndrome 08/10/2023   Tobacco abuse 08/10/2023   OSA on CPAP 08/10/2023   Pain in left hand 02/25/2023   Mallet finger of left hand 02/25/2023   Ischemic  stroke of frontal lobe (HCC) 01/11/2023   Insomnia 02/02/2020   Internal hemorrhoids 12/20/2017   Menopausal hot flushes 09/06/2017   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   Combined hyperlipidemia associated with type 2 diabetes mellitus (HCC) 07/18/2013   Proteinuria 07/18/2013   Sciatica of right side 07/18/2013   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   CKD (chronic kidney disease), stage IV (HCC) 07/18/2013   Hypertension due to kidney transplant 06/10/2011   History of renal transplant 04/19/2011   Long-term use of immunosuppressant medication 04/19/2011    ONSET DATE: 12-15-23  REFERRING DIAG: I63.9 (ICD-10-CM) - Acute stroke due to ischemia (HCC)  THERAPY DIAG:  Unsteadiness on feet  Muscle weakness (generalized)  Difficulty in walking, not elsewhere classified  Rationale for Evaluation and Treatment: Rehabilitation  SUBJECTIVE:  SUBJECTIVE STATEMENT: Pt reprots she is doing much better and is feeling much stronger; states she is now able to walk her dog for about 15- 20/day; feels she is back to her previous status - thinks she may be ready for discharge from PT Pt accompanied by: self  PERTINENT HISTORY: Pt is a 58 y/o F admitted on 12/15/23 after presenting with c/o slurred speech. MRI showed acute infarct in the right corona radiata. PMH: thalamic stroke May 2025, renal cell transplant - 2006, DM2, CKD IV, HLD, HTN   PMH:  Renal cell transplant, DM2, CKD IV, HLD, HTN, COPD, Mood disorder, Multiple thyroid  nodules, 3 strokes -- August '24 May '25 July '25 - thalamic stroke May 2025, h/o rhabdomyolysis approx. 2 weeks prior to CVA  PAIN:  Are you having pain? No  PRECAUTIONS: None  RED FLAGS: None   WEIGHT BEARING  RESTRICTIONS: No  FALLS: Has patient fallen in last 6 months? No  LIVING ENVIRONMENT: Lives with: lives with their spouse Lives in: House/apartment Stairs: New porch being built - will be 5 steps; 4 with handrails - not able to reach both  Has following equipment at home: None  PLOF: Independent  PATIENT GOALS: increase strength and endurance; improve balance   OBJECTIVE:  Note: Objective measures were completed at Evaluation unless otherwise noted.  DIAGNOSTIC FINDINGS: IMPRESSION: (CT head without contrast) 1. No acute intracranial abnormality. 2. Old lacunar infarct in the left corona radiata. 3. Atrophy with small vessel ischemic changes.  COGNITION: Overall cognitive status: Within functional limits for tasks assessed   SENSATION: WFL  COORDINATION: WFL's bil. LE's  POSTURE: No Significant postural limitations  LOWER EXTREMITY ROM:     Active  Right Eval Left Eval  Hip flexion 4 4-  Hip extension 3+ 4  Hip abduction 4 4-  Hip adduction    Hip internal rotation    Hip external rotation    Knee flexion 5 4  Knee extension 5 5  Ankle dorsiflexion 5 4  Ankle plantarflexion 5 4+  Ankle inversion    Ankle eversion     (Blank rows = not tested)  LOWER EXTREMITY MMT:    MMT Right Eval Left Eval  Hip flexion 4 4-  Hip extension 3+ 4  Hip abduction 4 4-  Hip adduction    Hip internal rotation    Hip external rotation    Knee flexion 5 4  Knee extension 5 5  Ankle dorsiflexion 5 4  Ankle plantarflexion 5 4+  Ankle inversion    Ankle eversion    (Blank rows = not tested)  BED MOBILITY:  Not tested  TRANSFERS: Sit to stand: Complete Independence  Assistive device utilized: None     Stand to sit: Complete Independence  Assistive device utilized: None      STAIRS: Findings: Level of Assistance: Modified independence, Stair Negotiation Technique: Alternating Pattern  with Single Rail on Right, Number of Stairs: 4, Height of Stairs: 6   , and  Comments: needs rail for safety to reduce fall risk GAIT: Findings: Gait Characteristics: WFL, Distance walked: 50, Assistive device utilized:None, Level of assistance: Modified independence, and Comments: pt reports she gets easily fatigued - pt wearing sandals (slip on) at eval  FUNCTIONAL TESTS:  30 seconds chair stand test Timed up and go (TUG): 9.66 10 meter walk test: 8.28 secs = 3.96 ft/sec without device  MCTSIB: Condition 1: Avg of 3 trials: 30 sec, Condition 2: Avg of 3 trials: 30 sec, Condition 3:  Avg of 3 trials: 30 sec, Condition 4: Avg of 3 trials: 13 sec, and Total Score: 103/120 30 seconds chair stand test - 14x from mat table                                                                                                                               TREATMENT DATE: 02-02-24  Gait:   02/02/24 0001  Functional Gait  Assessment  Gait assessed  Yes  Gait Level Surface 3  Change in Gait Speed 3  Gait with Horizontal Head Turns 3  Gait with Vertical Head Turns 3  Gait and Pivot Turn 3  Step Over Obstacle 3  Gait with Narrow Base of Support 3  Gait with Eyes Closed 2  Ambulating Backwards 3  Steps 3  Total Score 29   TherAct: 30 sec sit to stand test - pt performed sit to stand transfers - 18x in 30.75 secs Pt stood for 30 secs with feet together on Airex with EC (condition 4 of mCTSIB) - for LTG assessment   Self care:   Discussed LTG's and pt's status; reviewed HEP as previously issued - pt reports no problems or ?'s regarding HEP    HEP issued:   Access Code: [email or web address] URL: [email or web address] Date: 01/18/2024 Prepared by: Rock Kussmaul  Exercises - Feet together - Eyes open and eyes closed  - 1 x daily - 7 x weekly - 1 sets - 1-2 reps - 30 sec hold - HEEL TO TOE   - 1 x daily - 7 x weekly - 1 sets - 1-2 reps - 30 secs hold - Single Leg Stance  - 1 x daily - 7 x weekly - 1 sets - 1-2 reps - 10 hold - Standing Hip Extension with Resistance   - 1 x daily - 7 x weekly - 2-3 sets - 10 reps   PATIENT EDUCATION: Education details: eval results, POC, HEP and recommendation for walking program (approx. 5-8 to start and gradually increase up to 25-30 as able) Person educated: Patient Education method: Explanation, Demonstration, and Handouts Education comprehension: verbalized understanding and returned demonstration  HOME EXERCISE PROGRAM: See above; yellow and red theraband given for hip extension strengthening exercise  GOALS: Goals reviewed with patient? Yes  SHORT TERM GOALS: same as LTG's as ELOS = 4 weeks   LONG TERM GOALS: Target date: 02-17-24  Pt will report ability to ambulate 20 nonstop for increased endurance/activity tolerance.  Baseline: TBA Goal status: Goal met per pt report 02-02-24  2.  Pt will negotiate 4 steps using step over step sequence without use of handrail to demonstrate improved balance.  Baseline: rail needed Goal status: Goal met 02-02-24  3.  Pt will increase sit to stand transfers from 14 to 18 in 30 sec chair stand test to demo improved LE strength.  Baseline: 14x; 18x in 30.75 secs - 02-02-24 Goal status: Goal met 02-02-24  4.  Pt will  stand for 30 secs on condition 4 mCTSIB without LOB to demonstrate increased vestibular input and improved balance.  Baseline: 13 secs;    31.06 secs on 02-02-24 Goal status: Goal met 02-02-24  5.  Increase FGA score by at least 3 points to demo improved balance with gait.  Baseline: TBA Goal status: Goal deferred as score 29/30 on 02-02-24  6.  Independent in HEP for balance, strengthening and endurance (walking program). Baseline:  Goal status: Goal met 02-02-24  ASSESSMENT:  CLINICAL IMPRESSION: Pt has met all LTG's with LTG #5 deferred as FGA score = 29/30.  Pt has made excellent progress with increasing strength, endurance/activity tolerance, and balance.  Pt is discharged due to all LTG's met; pt reports she is ready for discharge due to progress  achieved.  No further needs identified at this time.   OBJECTIVE IMPAIRMENTS: decreased activity tolerance, decreased balance, decreased endurance, difficulty walking, decreased strength, and impaired UE functional use.   ACTIVITY LIMITATIONS: carrying, lifting, bending, squatting, and locomotion level  PARTICIPATION LIMITATIONS: meal prep, cleaning, laundry, shopping, community activity, occupation, and yard work  PERSONAL FACTORS: Past/current experiences and 1-2 comorbidities: h/o multiple CVA's & rhabdomyolysis are also affecting patient's functional outcome.   REHAB POTENTIAL: Excellent  CLINICAL DECISION MAKING: Evolving/moderate complexity  EVALUATION COMPLEXITY: Moderate  PLAN:  PT FREQUENCY: 1x/week  PT DURATION: 4 weeks + eval  PLANNED INTERVENTIONS: 97110-Therapeutic exercises, 97530- Therapeutic activity, 97112- Neuromuscular re-education, [phone]- Self Care, [id number]- Gait training, and [phone]- Aquatic Therapy  PLAN FOR NEXT SESSION: N/A - D/C on 02-02-24  PHYSICAL THERAPY DISCHARGE SUMMARY  Visits from Start of Care: 2  Current functional level related to goals / functional outcomes: See above for progress towards goals - all LTG's achieved   Remaining deficits: Very slightly decreased vestibular input in maintaining balance but is not affecting function at this time  Gait and balance are WNL's   Education / Equipment: Pt has been instructed in HEP for strengthening and high level balance exercises; pt is also walking for exercise/endurance training   Patient agrees to discharge. Patient goals were met. Patient is being discharged due to meeting the stated rehab goals.    Roxanna [place], PT 02/03/2024, 5:39 PM

## 2024-02-02 NOTE — Therapy (Signed)
 OUTPATIENT OCCUPATIONAL THERAPY NEURO TREATMENT  Patient Name: Isabella Obrien MRN: [id number] DOB:January 07, 1966, 58 y.o., female Today's Date: 02/02/2024  PCP: Rolinda Millman, MD  REFERRING PROVIDER: Jonel Lonni SQUIBB, MD  END OF SESSION:  OT End of Session - 02/02/24 1316     Visit Number 2    Number of Visits 5    Date for OT Re-Evaluation 03/02/24    Authorization Type [employer] 2025 Covered 100% MET    Authorization Time Period VL:MN Auth Not Reqd    OT Start Time 1317    OT Stop Time 1430    OT Time Calculation (min) 73 min    Equipment Utilized During Treatment putty and FM objects    Activity Tolerance Patient tolerated treatment well    Behavior During Therapy WFL for tasks assessed/performed          Past Medical History:  Diagnosis Date   Chronic kidney disease    has kidney transplant - having aphoresis now every other week   Diabetes mellitus without complication (HCC)    dm type 2 - fasting 150   History of blood product transfusion    Hyperlipidemia    Hypertension    Menopausal hot flushes 09/06/2017   Controlled on Lexapro  by Dr. Marget   Neuromuscular disorder [facility])    Stroke [facility])    Past Surgical History:  Procedure Laterality Date   COLONOSCOPY     diateck catheter placement  09/2013   KIDNEY TRANSPLANT     ORIF ANKLE FRACTURE Left 2014   TEE WITHOUT CARDIOVERSION N/A 01/13/2023   Procedure: TRANSESOPHAGEAL ECHOCARDIOGRAM;  Surgeon: Santo Stanly LABOR, MD;  Location: [facility] INVASIVE [facility];  Service: Cardiovascular;  Laterality: N/A;   Patient Active Problem List   Diagnosis Date Noted   Normocytic anemia 12/16/2023   History of essential hypertension 12/16/2023   Rhabdomyolysis 11/30/2023   Adnexal mass 11/30/2023   AKI (acute kidney injury) (HCC) 11/29/2023   DM2 (diabetes mellitus, type 2) (HCC) 10/14/2023   Acute ischemic stroke (HCC) 10/13/2023   Severe obstructive sleep apnea-hypopnea syndrome 08/10/2023   Tobacco abuse  08/10/2023   OSA on CPAP 08/10/2023   Pain in left hand 02/25/2023   Mallet finger of left hand 02/25/2023   Ischemic stroke of frontal lobe (HCC) 01/11/2023   Insomnia 02/02/2020   Internal hemorrhoids 12/20/2017   Menopausal hot flushes 09/06/2017   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   Combined hyperlipidemia associated with type 2 diabetes mellitus (HCC) 07/18/2013   Proteinuria 07/18/2013   Sciatica of right side 07/18/2013   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   CKD (chronic kidney disease), stage IV (HCC) 07/18/2013   Hypertension due to kidney transplant 06/10/2011   History of renal transplant 04/19/2011   Long-term use of immunosuppressant medication 04/19/2011    ONSET DATE: Referral 12/16/23; Hospitalization 7/24-7/26/2025  REFERRING DIAG: I63.9 (ICD-10-CM) - Cerebral infarction, unspecified  THERAPY DIAG:  Other lack of coordination  Muscle weakness (generalized)  Other symptoms and signs involving the nervous system  Hemiplegia and hemiparesis following cerebral infarction affecting left dominant side (HCC)  Rationale for Evaluation and Treatment: Rehabilitation  SUBJECTIVE:   SUBJECTIVE STATEMENT:  Pt reports she had procedure on her thyroid  yesterday but she feels fine.   When asked about her medication, pt reported they removed Ozempic and added [facility] yesterday but this does not reflect in her medication list yet.  She also has a new medication for her cholesterol -  Repatha  via injection every 2 weeks.  Pt reports she has been doing her shoulder ROM activities and can already tell her shoulder is moving better.   Pt accompanied by: self - pt drove herself  PERTINENT HISTORY:   Pt is a 58 y/o F admitted on 12/15/23 after presenting with c/o slurred speech. MRI showed acute infarct in the right corona radiata. PMH: thalamic stroke May 2025, renal cell transplant - 2006, DM2, CKD IV,  HLD, HTN  PMH:  Renal cell transplant, DM2, CKD IV, HLD, HTN, COPD, Mood disorder, Multiple thyroid  nodules, 3 strokes -- August '24 May '25 July '25 - thalamic stroke May 2025, rhabdomyolysis  PRECAUTIONS: Fall  WEIGHT BEARING RESTRICTIONS: No  PAIN:  Are you having pain? No  FALLS: Has patient fallen in last 6 months? No  LIVING ENVIRONMENT: Family/patient discharged to:: Private residence Living Arrangements: Spouse/significant other, daughter freshman in college 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 Pt reports she has experienced some weakness since admission ~2 weeks ago for rhabdomyolysis, ambulating very carefully around the home but denies falls. ADLs Comments: Pt reports since admission ~2 weeks ago 2/2 rhabdomyolysis, she has required assistance getting out of tub after taking a bath. Cooks, has a Social research officer, government.  PATIENT GOALS: Pt reported she would like to speed up with thought process, moving and using her hands, speech is slower and having to think about what I'm saying.  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  HAND DOMINANCE: Right  ADLs:  Overall ADLs: Pt is basically Mod Ind at this time Transfers/ambulation related to ADLs: Ind Eating: Ind Grooming: Ind UB Dressing: Ind LB Dressing: Ind but still slower than usual, wondered if she could stand up  Toileting: Pt  reports urgency with bowel - waiting colonoscopy Bathing: standing in shower - but prefers a tub bath Tub Shower transfers: using walk in shower Equipment: Walk in shower, Reacher, and hand held shower (in walk in shower)  IADLs: Drive - [employer] Shopping: [employer], spouse does grocery shopping Light housekeeping: vacuum, swiffer, toilets/sinks, laundry (some) Meal Prep: don't want to (normal), can do it Community mobility: Ind with no AE Medication management: AM/PM medicine box Financial management: Ind Handwriting: No changes  MOBILITY STATUS: Independent  POSTURE COMMENTS:  forward head Sitting balance: WNL  ACTIVITY TOLERANCE: Activity tolerance: Lot more tired -naps 1x/day ~3+ hours   FUNCTIONAL OUTCOME MEASURES: Quick Dash: 13.6   UPPER EXTREMITY ROM:   Lifting arms  Active ROM Right eval Left eval  Shoulder flexion Slight limits WFL  Shoulder abduction    Shoulder adduction    Shoulder extension    Shoulder internal rotation    Shoulder external rotation    Elbow flexion [facility] WFL  Elbow extension    Wrist flexion    Wrist extension    Wrist ulnar deviation    Wrist radial deviation    Wrist pronation    Wrist supination    (Blank rows = not tested)  UPPER EXTREMITY MMT:     MMT Right eval Left eval  Shoulder flexion 4 4  Shoulder abduction    Shoulder adduction    Shoulder extension    Shoulder internal rotation    Shoulder external rotation    Middle trapezius    Lower trapezius    Elbow flexion 4 4  Elbow extension    Wrist flexion    Wrist extension    Wrist ulnar deviation    Wrist radial deviation    Wrist pronation    Wrist supination    (Blank rows = not tested)  HAND FUNCTION: Grip strength: Right: 31.9, 29.1, 36.5 lbs; Left: 31.3, 38.8, 35.9 lbs Average: Right - 32.5 lbs; Left - 35.3 lbs  COORDINATION: 9 Hole Peg test: Right: 29.70 sec; Left: 24.63 sec  SENSATION: WFL  EDEMA: NA in UEs but reports some in ankles  ie) [place]  MUSCLE TONE: WFL  COGNITION: Overall cognitive status: Within functional limits for tasks assessed Increased emotional lability r/t sadness mostly  VISION: Subjective report: No changes Baseline vision: Wears glasses for reading only ie) to read pill bottle  Visual history: NA  VISION ASSESSMENT: Not tested  Patient has difficulty with following activities due to following visual impairments: reading pill bottle  OBSERVATIONS: Pt ambulates with no AE and no loss of balance. The pt is well kept and admits to being more emotionally sensitive with several episodes of crying during evaluation.                                                                                                                            TODAY'S TREATMENT:    - Therapeutic activities completed for duration as noted below including:  Initiated Putty Activities with pink putty to begin strengthening, coordination and ROM of B UEs.  Patient provided visual demonstration, verbal and tactile cues as needed to improve performance of the various exercises/activities including:   - Putty Squeezes - cues to squeeze putty into log for use with other exercises, to fold the putty in half with 1 hand and then roll it into a circular cinnamon-roll shape  - Putty Rolls - encourage to roll putty into logs with sensory stimulation to entire length of hand, fingers and wrist as needed as well as to work on shoulder/arm ROM   - Pinch and Pull with Putty - this motion is combined with different pinches (3-Point Pinch, Tip Pinch, Key Pinch) - patient encouraged to combine tripod, pincer and/or key pinch with pinch and pull motion of putty pulling away from midline, changing between different pinches and changing different directions to change grip  - Finger Extension with Putty - pt shown how to work on task with all fingers and thumb as well as individual fingers in opposition to  thumb  - Finger Adduction with Putty - pt shown how to work on weaving putty between fingers/thumb and then squeeze fingers together while  laying hand flat on table top.  - Removing Objects from Putty  - encouraged to hide items (coins, marble, dice etc) and use one hand at a time to find the objects and identify them by tactile input before s/he digs them out and can see them visually.  OT educated patient on theraputty recommendations: avoid small children, pets, hot environments, place in designated container and avoid contact with fabrics. Patient verbalized understanding.    Patient benefited from extra time, verbal/tactile cues, and modeling of task to allow time for processing of verbal instructions and improve motor planning of unfamiliar movements.  Coordination Exercise/Activity handout with images provided for various activities to work on B UE finger ROM, dexterity and isolated movements with demonstration and practice, as well as modification, hand over hand guidance and cues throughout to improve technique, digital isolation and ease of performing task.  Tasks included:  Pick up coins, dominoes, buttons, marbles, dried beans/pasta of different sizes ... To place in containers To stack - with guidance to work on include/isolate specific fingers. To pick up items one at a time until patient got 5+ in their hand and then move item from palm to fingertips to release ie) Finger-to-palm then palm-to-finger translation of small items - Options to vary difficulty include using a washcloth under items like coins or using larger items (checkers vs coins or blocks/dominos vs dice) for increased ease of picking up items.  Shuffling, Flipping and dealing cards 1 at a time. -- Setup patient to work on sorting cards, focusing on using index finger with thumb to flip cards or holding deck of cards in palm of hand and push off 1 card at a time from the top of the deck using only thumb - OT educated  pt on table top play of Golf Solitaire for BUE to address fine motor coordination, upper extremity range of motion, bimanual coordination/trunk control, sequencing of unfamiliar motor movements or tasks, item/pattern recognition, and endurance/stamina. Pt required minimal cues for proper play.   Rotate golf balls (clockwise and counter-clockwise) with forearm pronated and balls on table or supinated and balls in hand.   Twirl pen/cil between fingers. - Encouragement to isolate fingers individually and twirl (rotation) or flipping and shift up and down the pen (translation) to get it in position for writing or erasing.    Tear a piece of paper towel and roll it into small balls with fingertips ie) straw wrapping when eating out.    Patient is encouraged to take breaks, relax arm/shoulder by supporting forearm, minimize compensatory motions and a try different activities throughout the day/week including games like Londa (for the dice), card games, Connect 4 etc.   Patient benefited from extra time, verbal/tactile cues, and modeling of task to allow time for processing of verbal instructions and improve motor planning of unfamiliar movements.    - Self Care education and training completed for duration as noted below including:  Pt education provided re: Memory Compensation Strategies as noted in: "WARM" strategy ie) W= write it down A=  associate it R=  repeat it M=  make a mental picture - keeping a Memory Notebook/calendar/ schedule for the day/week.  - keep a basket, or message board by the door for messages/reminders including use of sticky notes. - using phone alarms, timers, and/or a reminder app.  OT educated patient on the 4 Ps of energy conservation including positioning, pace, prioritizing, and planning to maximize efficiency and safety with completion of functional activities as desired.  Patient verbalized understanding of all information provided and was given a handout to take  home for review.   PATIENT EDUCATION: Education details: Putty/coordination Activities; Firefighter  Person educated: Patient Education method: Programmer, multimedia, Facilities manager, Actor cues, Verbal cues, and Handouts Education comprehension: verbalized understanding, returned demonstration, verbal cues required, tactile cues required, and needs further education  HOME EXERCISE PROGRAM: 01/17/24: UE ROM HEP initiated Access Code: [email or web address]  02/02/24: Putty Activities Access Access Code: [email or web address]; coordination; Memory/Energy Conservation  GOALS: Goals reviewed with patient? Yes  LONG TERM GOALS: Target date: 02/24/24  Patient will demonstrate updated B UE HEP with visual handouts only for proper execution. Baseline: New to outpt OT and initial HEP ideas provided Goal status: IN Progress  2.  Patient will demonstrate at least 5-10 lbs improvement in grip strength as needed to open jars and other containers, especially in dominant RUE. Baseline: Average: Right - 32.5 lbs; Left - 35.3 lbs Goal status: IN Progress  3.  Patient will demo improved FM coordination as evidenced by completing nine-hole peg with use of dominant RUE in 25 seconds or less. Baseline: Right: 29.70 sec; Left: 24.63 sec Goal status: IN Progress  4.  Patient will independently verbalize at least 3 energy conservation principles in relation to ADLs to increase functional independence and tolerate 45 minute session with multiple tasks with minimal rest breaks. Baseline: Napping daily ~ 3 hours/day compared to working  Goal status: IN Progress  5.  Patient will demonstrate minimal deficits with Quick Dash score indicating improved functional use of affected extremity. Baseline: QuickDash- 13.6% Goal status: IN Progress   ASSESSMENT:  CLINICAL IMPRESSION: Patient is a 58 y.o. female who was seen today for occupational therapy treatment for UE weakness s/p 3rd stroke and recent rhabdomyolysis . Patient  responded well to various HEP ideas including putty and coordination HEPs along with education re: Memory strategies and Energy Conservation. Pt will benefit from continued skilled OT services in the outpatient setting to review and progress HEPs and help return to PLOF for resuming work.    PERFORMANCE DEFICITS: in functional skills including ADLs, IADLs, coordination, dexterity, ROM, strength, pain, flexibility, Fine motor control, Gross motor control, mobility, balance, endurance, decreased knowledge of precautions, decreased knowledge of use of DME, and UE functional use, cognitive skills including emotional and energy/drive, and psychosocial skills including coping strategies, environmental adaptation, habits, interpersonal interactions, and routines and behaviors.   IMPAIRMENTS: are limiting patient from ADLs, IADLs, rest and sleep, work, leisure, and social participation.   CO-MORBIDITIES: has co-morbidities such as prior strokes and recent rhabdomyolysis that affects occupational performance. Patient will benefit from skilled OT to address above impairments and improve overall function.  REHAB POTENTIAL: Good  PLAN:  OT FREQUENCY: 1x/week  OT DURATION: 6 weeks  PLANNED INTERVENTIONS: 97535 self care/ADL training, 02889 therapeutic exercise, 97530 therapeutic activity, 97112 neuromuscular re-education, balance training, visual/perceptual remediation/compensation, psychosocial skills training, energy conservation, coping strategies training, patient/family education, and DME and/or AE instructions  RECOMMENDED OTHER SERVICES: ?Speech therapy  CONSULTED AND AGREED WITH PLAN OF CARE: Patient  PLAN FOR NEXT SESSION:  Review and progress HEPs - putty, coordination and ROM (issued 9/11) Cognitive tasks for work simulation - review Golf Solitaire UBE - endurance   Clarita LITTIE Pride, OT 02/02/2024, 2:38 PM

## 2024-02-03 ENCOUNTER — Encounter: Payer: Self-pay | Admitting: Physical Therapy

## 2024-02-03 LAB — CYTOLOGY - NON PAP

## 2024-02-03 NOTE — Progress Notes (Signed)
   02/02/24 0001  Functional Gait  Assessment  Gait assessed  Yes  Gait Level Surface 3  Change in Gait Speed 3  Gait with Horizontal Head Turns 3  Gait with Vertical Head Turns 3  Gait and Pivot Turn 3  Step Over Obstacle 3  Gait with Narrow Base of Support 3  Gait with Eyes Closed 2  Ambulating Backwards 3  Steps 3  Total Score 29

## 2024-02-07 ENCOUNTER — Ambulatory Visit: Admitting: Occupational Therapy

## 2024-02-07 ENCOUNTER — Ambulatory Visit: Admitting: Physical Therapy

## 2024-02-13 ENCOUNTER — Other Ambulatory Visit: Payer: Self-pay | Admitting: Radiology

## 2024-02-14 ENCOUNTER — Ambulatory Visit: Admitting: Physical Therapy

## 2024-02-14 ENCOUNTER — Ambulatory Visit: Admitting: Occupational Therapy

## 2024-02-14 DIAGNOSIS — M6281 Muscle weakness (generalized): Secondary | ICD-10-CM

## 2024-02-14 DIAGNOSIS — I69352 Hemiplegia and hemiparesis following cerebral infarction affecting left dominant side: Secondary | ICD-10-CM

## 2024-02-14 DIAGNOSIS — R278 Other lack of coordination: Secondary | ICD-10-CM

## 2024-02-14 DIAGNOSIS — R29818 Other symptoms and signs involving the nervous system: Secondary | ICD-10-CM

## 2024-02-14 NOTE — Therapy (Signed)
 OUTPATIENT OCCUPATIONAL THERAPY NEURO TREATMENT & DISCHARGE SUMMARY  Patient Name: Isabella Obrien MRN: [id number] DOB:Dec 13, 1965, 58 y.o., female Today's Date: 02/14/2024  PCP: Rolinda Millman, MD  REFERRING PROVIDER: Jonel Lonni SQUIBB, MD  END OF SESSION:  OT End of Session - 02/14/24 1017     Visit Number 3    Number of Visits 5    Date for Recertification  03/02/24    Authorization Type [employer] 2025 Covered 100% MET    Authorization Time Period VL:MN Auth Not Reqd    OT Start Time 1018    OT Stop Time 1056    OT Time Calculation (min) 38 min    Equipment Utilized During Treatment --    Activity Tolerance Patient tolerated treatment well    Behavior During Therapy WFL for tasks assessed/performed          Past Medical History:  Diagnosis Date   Chronic kidney disease    has kidney transplant - having aphoresis now every other week   Diabetes mellitus without complication (HCC)    dm type 2 - fasting 150   History of blood product transfusion    Hyperlipidemia    Hypertension    Menopausal hot flushes 09/06/2017   Controlled on Lexapro  by Dr. Marget   Neuromuscular disorder [facility])    Stroke [facility])    Past Surgical History:  Procedure Laterality Date   COLONOSCOPY     diateck catheter placement  09/2013   KIDNEY TRANSPLANT     ORIF ANKLE FRACTURE Left 2014   TEE WITHOUT CARDIOVERSION N/A 01/13/2023   Procedure: TRANSESOPHAGEAL ECHOCARDIOGRAM;  Surgeon: Santo Stanly LABOR, MD;  Location: [facility] INVASIVE [facility];  Service: Cardiovascular;  Laterality: N/A;   Patient Active Problem List   Diagnosis Date Noted   Normocytic anemia 12/16/2023   History of essential hypertension 12/16/2023   Rhabdomyolysis 11/30/2023   Adnexal mass 11/30/2023   AKI (acute kidney injury) 11/29/2023   DM2 (diabetes mellitus, type 2) (HCC) 10/14/2023   Acute ischemic stroke (HCC) 10/13/2023   Severe obstructive sleep apnea-hypopnea syndrome 08/10/2023   Tobacco abuse 08/10/2023    OSA on CPAP 08/10/2023   Pain in left hand 02/25/2023   Mallet finger of left hand 02/25/2023   Ischemic stroke of frontal lobe (HCC) 01/11/2023   Insomnia 02/02/2020   Internal hemorrhoids 12/20/2017   Menopausal hot flushes 09/06/2017   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   Combined hyperlipidemia associated with type 2 diabetes mellitus (HCC) 07/18/2013   Proteinuria 07/18/2013   Sciatica of right side 07/18/2013   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   CKD (chronic kidney disease), stage IV (HCC) 07/18/2013   Hypertension due to kidney transplant 06/10/2011   History of renal transplant 04/19/2011   Long-term use of immunosuppressant medication 04/19/2011    ONSET DATE: Referral 12/16/23; Hospitalization 7/24-7/26/2025  REFERRING DIAG: I63.9 (ICD-10-CM) - Cerebral infarction, unspecified  THERAPY DIAG:  Muscle weakness (generalized)  Other lack of coordination  Hemiplegia and hemiparesis following cerebral infarction affecting left dominant side (HCC)  Other symptoms and signs involving the nervous system  Rationale for Evaluation and Treatment: Rehabilitation  SUBJECTIVE:   SUBJECTIVE STATEMENT:  Pt reports she found out she has colon cancer s/p colonoscopy and has several upcoming follow-ups to determine upcoming plans.  Pt reports she has been doing her shoulder is doing better and that she hopes she will be done with therapy today.  Pt accompanied by: self   PERTINENT  HISTORY:   Pt is a 58 y/o F admitted on 12/15/23 after presenting with c/o slurred speech. MRI showed acute infarct in the right corona radiata. PMH: thalamic stroke May 2025, renal cell transplant - 2006, DM2, CKD IV, HLD, HTN  PMH:  Renal cell transplant, DM2, CKD IV, HLD, HTN, COPD, Mood disorder, Multiple thyroid  nodules, 3 strokes -- August '24 May '25 July '25 - thalamic stroke May 2025, rhabdomyolysis  PRECAUTIONS:  Fall  WEIGHT BEARING RESTRICTIONS: No  PAIN:  Are you having pain? No  FALLS: Has patient fallen in last 6 months? No  LIVING ENVIRONMENT: Family/patient discharged to:: Private residence Living Arrangements: Spouse/significant other, daughter freshman in college [facility]) Available Help at Discharge: Family Type of Home: House Home Access: Stairs to enter Secretary/administrator of Steps: 5 at front Entrance Stairs-Rails: Right Home Layout: One level Bathroom Shower/Tub: Tub/shower unit;Walk-in shower Bathroom Accessibility: No Home Equipment: Hand held shower head  PLOF:  Prior Level of Function : Independent/Modified Independent; Working/employed; Driving Mobility Comments: 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 Pt reports she has experienced some weakness since admission ~2 weeks ago for rhabdomyolysis, ambulating very carefully around the home but denies falls. ADLs Comments: Pt reports since admission ~2 weeks ago 2/2 rhabdomyolysis, she has required assistance getting out of tub after taking a bath. Cooks, has a Social research officer, government.  PATIENT GOALS: Pt reported she would like to speed up with thought process, moving and using her hands, speech is slower and having to think about what I'm saying.  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  HAND DOMINANCE: Right  ADLs:  Overall ADLs: Pt is basically Mod Ind at this time Transfers/ambulation related to ADLs: Ind Eating: Ind Grooming: Ind UB Dressing: Ind LB Dressing: Ind but still slower than usual, wondered if she could stand up  Toileting: Pt reports urgency with bowel - waiting colonoscopy Bathing: standing in shower - but prefers a tub bath Tub Shower transfers: using walk in shower Equipment: Walk in shower, Reacher, and hand held shower (in walk  in shower)  IADLs: Drive - [employer] Shopping: [employer], spouse does grocery shopping Light housekeeping: vacuum, swiffer, toilets/sinks, laundry (some) Meal Prep: don't want to (normal), can do it Community mobility: Ind with no AE Medication management: AM/PM medicine box Financial management: Ind Handwriting: No changes  MOBILITY STATUS: Independent  POSTURE COMMENTS:  forward head Sitting balance: WNL  ACTIVITY TOLERANCE: Activity tolerance: Lot more tired -naps 1x/day ~3+ hours   FUNCTIONAL OUTCOME MEASURES: Quick Dash: 13.6 02/14/24 Discharge 2.3    UPPER EXTREMITY ROM:   Lifting arms  Active ROM Right eval Left eval  Shoulder flexion Slight limits WFL  Shoulder abduction    Shoulder adduction    Shoulder extension    Shoulder internal rotation    Shoulder external rotation    Elbow flexion [facility] WFL  Elbow extension    Wrist flexion    Wrist extension    Wrist ulnar deviation    Wrist radial deviation    Wrist pronation    Wrist supination    (Blank rows = not tested)  UPPER EXTREMITY MMT:     MMT Right eval Left eval  Shoulder flexion 4 4  Shoulder abduction    Shoulder adduction    Shoulder extension    Shoulder  internal rotation    Shoulder external rotation    Middle trapezius    Lower trapezius    Elbow flexion 4 4  Elbow extension    Wrist flexion    Wrist extension    Wrist ulnar deviation    Wrist radial deviation    Wrist pronation    Wrist supination    (Blank rows = not tested)  HAND FUNCTION: Grip strength: Right: 31.9, 29.1, 36.5 lbs; Left: 31.3, 38.8, 35.9 lbs Average: Right - 32.5 lbs; Left - 35.3 lbs  Discharge 02/14/24 Right 49.1, 42.9, 44.9 lbs Left 41.4, 43.2, 47.3 lbs Average: Right 45.6 lbs, Left 43.9 lbs  COORDINATION: 9 Hole Peg test: Right: 29.70 sec; Left: 24.63 sec  02/14/24 Discharge: Right 18.94 sec Left 24.19 sec  SENSATION: WFL  EDEMA: NA in UEs but reports some in ankles ie) [place]  MUSCLE TONE: WFL  COGNITION: Overall cognitive status: Within functional limits for tasks assessed Increased emotional lability r/t sadness mostly  VISION: Subjective report: No changes Baseline vision: Wears glasses for reading only ie) to read pill bottle  Visual history: NA  VISION ASSESSMENT: Not tested  Patient has difficulty with following activities due to following visual impairments: reading pill bottle  OBSERVATIONS: Pt ambulates with no AE and no loss of balance. The pt is well kept and admits to being more emotionally sensitive with several episodes of crying during evaluation.                                                                                                                            TODAY'S TREATMENT:    - Therapeutic activities completed for duration as noted below including:  Therapist reviewed goals with patient and updated patient progression.  No additional functional limitations identified. Grip strength improved EVAL Right - 32.5 lbs; Left - 35.3 lbs    Discharge Right - 45.6 lbs, Left - 43.9 lbs  Coordination improved via 9 hole peg test: Right: 29.70 sec; Left: 24.63 sec      Discharge: Right 18.94 sec Left 24.19 sec  Discharge education and training completed for review of Putty Activities and Coordination Activity HEPs for continued work on B UE finger ROM, dexterity and strength. Patient is encouraged to continue with various FM and strengthening activities as well as other tasks for stress relief and distraction as she continues to navigate her medial issues ie) adult coloring book, word search, Suduko, puzzles etc.     - Self Care education and training completed for duration as noted below including:  Reviewed: Memory Compensation Strategies and 4 Ps of energy conservation including positioning, pace, prioritizing, and planning to maximize efficiency and safety with completion of functional activities as desired.   Patient verbalized understanding of all information provided and was given a handout to take home for review.   PATIENT EDUCATION: Education details: Putty/coordination Activities; Memory/Energy Conservation  Person educated: Patient Education method: Programmer, multimedia, Facilities manager,  Tactile cues, Verbal cues, and Handouts Education comprehension: verbalized understanding, returned demonstration, verbal cues required, tactile cues required, and needs further education  HOME EXERCISE PROGRAM: 01/17/24: UE ROM HEP initiated Access Code: [email or web address]  02/02/24: Putty Activities Access Access Code: [email or web address]; coordination; Memory/Energy Conservation  GOALS: Goals reviewed with patient? Yes  LONG TERM GOALS: Target date: 02/24/24  Patient will demonstrate updated B UE HEP with visual handouts only for proper execution. Baseline: New to outpt OT and initial HEP ideas provided Goal status: MET  2.  Patient will demonstrate at least 5-10 lbs improvement in grip strength as needed to open jars and other containers, especially in dominant RUE. Baseline: Average: Right - 32.5 lbs; Left - 35.3 lbs Goal status: MET 02/14/24: Average: Right 45.6 lbs, Left 43.9 lbs  3.  Patient will demo improved FM coordination as evidenced by completing nine-hole peg with use of dominant RUE in 25 seconds or less. Baseline: Right: 29.70 sec; Left: 24.63 sec Goal status: MET 02/14/24: Right 18.94 sec Left 24.19 sec  4.  Patient will independently verbalize at least 3 energy conservation principles in relation to ADLs to increase functional independence and tolerate 45 minute session with multiple tasks with minimal rest breaks. Baseline: Napping daily ~ 3 hours/day compared to working  Goal status: MET 02/14/24 Hasn't been napping as much, but still not sleeping through the night due to worry; has energy conservation education/handouts  5.  Patient will demonstrate minimal deficits with Quick Dash score indicating improved  functional use of affected extremity. Baseline: QuickDash- 13.6% Goal status: MET 02/14/24: 2.3%   ASSESSMENT:  CLINICAL IMPRESSION: Patient is a 58 y.o. female who was seen today for occupational therapy treatment for UE weakness s/p 3rd stroke with patient responding well to various HEP ideas including putty and coordination HEPs. Patient is appropriate for discharge and no longer demonstrates medical necessity for continued skilled occupational therapy services.  PERFORMANCE DEFICITS: in functional skills including ADLs, IADLs, coordination, dexterity, ROM, strength, pain, flexibility, Fine motor control, Gross motor control, mobility, balance, endurance, decreased knowledge of precautions, decreased knowledge of use of DME, and UE functional use, cognitive skills including emotional and energy/drive, and psychosocial skills including coping strategies, environmental adaptation, habits, interpersonal interactions, and routines and behaviors.   IMPAIRMENTS: are limiting patient from ADLs, IADLs, rest and sleep, work, leisure, and social participation.   CO-MORBIDITIES: has co-morbidities such as prior strokes and recent rhabdomyolysis that affects occupational performance. Patient will benefit from skilled OT to address above impairments and improve overall function.  REHAB POTENTIAL: Good  PLAN:  OCCUPATIONAL THERAPY DISCHARGE SUMMARY  Visits from Start of Care: 3  Current functional level related to goals / functional outcomes: Pt has met all goals to satisfactory levels and is pleased with outcomes.   Remaining deficits: Pt has no more significant functional deficits or pain.   Education / Equipment: Pt has all needed materials and education. Pt understands how to continue on with self-management. See tx notes for more details.   Patient agrees to discharge due to max benefits received from outpatient occupational therapy at this time.     Clarita LITTIE Pride, OT 02/14/2024, 11:37  AM

## 2024-02-16 ENCOUNTER — Encounter (HOSPITAL_COMMUNITY): Payer: Self-pay

## 2024-02-21 ENCOUNTER — Ambulatory Visit: Admitting: Physical Therapy

## 2024-02-21 ENCOUNTER — Encounter: Admitting: Occupational Therapy

## 2024-03-25 ENCOUNTER — Other Ambulatory Visit: Payer: Self-pay | Admitting: Neurology

## 2024-03-26 NOTE — Telephone Encounter (Signed)
 Last seen on 01/26/24 Follow up scheduled on 08/09/24    Dispensed Days Supply Quantity Provider Pharmacy  REPATHA  SURE INJ 140MG /ML 03/20/2024 28 2 mL Rosemarie Eather RAMAN, MD [facility] #...      Too soon to refill

## 2024-04-06 NOTE — Telephone Encounter (Addendum)
  Dr. Vinson had spoken to general Nephrologist, Dr. Gearline x1 week ago. Dr. Gearline was agreeable to contact pt to discuss treatment plan due to concern with pt's knowledge and/or understanding of recent diagnosis.    Left message with nurse, Vaughn (T. [phone]) with [facility] to verify if Dr. Gearline has contacted pt. Awaiting call from nephrology nurse prior to contacting pt.  Electronically signed by: Delon Jenkins Snipes, RN 04/06/2024 12:07 PM     ----- Message from Wilbert Look, MD sent at 04/03/2024  3:59 PM EST ----- Please schedule pt to be seen in out clinic prior to Dec 1st  She will start chemo on Dec 1st , will need IS adjustment  Will be followed by us  afterwards on a monthly basis or so  Wilbert Look, MD

## 2024-04-11 NOTE — Telephone Encounter (Addendum)
 Called pt, advised appt is scheduled for Monday 11/24 at 10 am. Pt verbalized understanding. Electronically signed by: Delon Jenkins Snipes, RN 04/11/2024 3:56 PM      Left VM with Vaughn with  [facility].   Contacted pt, advised of follow up in transplant clinic prior to starting chemo on 12/1, and plan will likely be monthly follow up with transplant clinic. Pt verbalized understanding and reports she wants to keep all providers with [facility] and is transferring to [facility] Nephrology appt scheduled 12/3. Will follow up with Scheduler.  Electronically signed by: Delon Jenkins Snipes, RN 04/11/2024 3:23 PM      Left VM for Vaughn (T. [phone] ext [phone]),  to confirm Dr. Gearline did speak to pt as previously noted.  Electronically signed by: Delon Jenkins Snipes, RN 04/11/2024 12:55 PM    ----- Message from Wilbert Look, MD sent at 04/03/2024  3:59 PM EST ----- Please schedule pt to be seen in out clinic prior to Dec 1st  She will start chemo on Dec 1st , will need IS adjustment  Will be followed by us  afterwards on a monthly basis or so  Wilbert Look, MD

## 2024-04-23 ENCOUNTER — Telehealth: Payer: Self-pay | Admitting: Neurology

## 2024-04-23 NOTE — Telephone Encounter (Signed)
 Pt called ro request medication refill , Pt stated she has missed her last dose and was not sure what to do Evolocumab  (REPATHA  SURECLICK) 140 MG/ML SOAJ   Pharmacy :  [id number] [place], [place] - [address] AT [facility] OF [address] (Ph: [phone])

## 2024-04-24 ENCOUNTER — Other Ambulatory Visit: Payer: Self-pay

## 2024-04-24 NOTE — Telephone Encounter (Signed)
 Sent script to pharmacy.

## 2024-06-17 ENCOUNTER — Other Ambulatory Visit: Payer: Self-pay | Admitting: Neurology

## 2024-06-19 ENCOUNTER — Other Ambulatory Visit: Payer: Self-pay

## 2024-08-09 ENCOUNTER — Ambulatory Visit: Admitting: Adult Health
